# Patient Record
Sex: Female | Born: 1935 | Race: White | Hispanic: No | Marital: Single | Smoking: Never smoker
Health system: Southern US, Community
[De-identification: ages and names within clinical notes are randomized; demographics above are authoritative.]

## PROBLEM LIST (undated history)

## (undated) DIAGNOSIS — D6859 Other primary thrombophilia: Secondary | ICD-10-CM

## (undated) DIAGNOSIS — I2699 Other pulmonary embolism without acute cor pulmonale: Secondary | ICD-10-CM

## (undated) DIAGNOSIS — J45909 Unspecified asthma, uncomplicated: Secondary | ICD-10-CM

## (undated) DIAGNOSIS — I10 Essential (primary) hypertension: Secondary | ICD-10-CM

---

## 2007-01-29 ENCOUNTER — Ambulatory Visit: Payer: Self-pay | Admitting: Cardiology

## 2011-08-19 ENCOUNTER — Ambulatory Visit (HOSPITAL_COMMUNITY)
Admission: RE | Admit: 2011-08-19 | Discharge: 2011-08-19 | Disposition: A | Discharge status: Home / Self Care | Payer: Medicare Other | Source: Ambulatory Visit | Attending: Ophthalmology | Admitting: Ophthalmology

## 2011-08-19 ENCOUNTER — Encounter (HOSPITAL_COMMUNITY)
Admission: RE | Disposition: A | Discharge status: Home / Self Care | Payer: Self-pay | Source: Ambulatory Visit | Attending: Ophthalmology

## 2011-08-19 DIAGNOSIS — H26499 Other secondary cataract, unspecified eye: Secondary | ICD-10-CM | POA: Insufficient documentation

## 2011-08-19 SURGERY — TREATMENT, USING YAG LASER
Anesthesia: LOCAL | Laterality: Left

## 2011-08-19 MED ORDER — TETRACAINE HCL 0.5 % OP SOLN
1.0000 [drp] | Freq: Once | OPHTHALMIC | Status: AC
Start: 2011-08-19 — End: 2011-08-19
  Administered 2011-08-19: 1 [drp] via OPHTHALMIC

## 2011-08-19 MED ORDER — TROPICAMIDE 1 % OP SOLN
OPHTHALMIC | Status: AC
  Administered 2011-08-19: 1 [drp] via OPHTHALMIC
  Filled 2011-08-19: qty 3

## 2011-08-19 MED ORDER — TROPICAMIDE 1 % OP SOLN
1.0000 [drp] | OPHTHALMIC | Status: AC
  Administered 2011-08-19 (×3): 1 [drp] via OPHTHALMIC

## 2011-08-19 MED ORDER — APRACLONIDINE HCL 1 % OP SOLN
OPHTHALMIC | Status: AC
  Administered 2011-08-19: 1 [drp] via OPHTHALMIC
  Filled 2011-08-19: qty 0.1

## 2011-08-19 MED ORDER — APRACLONIDINE HCL 1 % OP SOLN
1.0000 [drp] | OPHTHALMIC | Status: AC
  Administered 2011-08-19 (×3): 1 [drp] via OPHTHALMIC

## 2011-08-19 MED ORDER — TETRACAINE HCL 0.5 % OP SOLN
OPHTHALMIC | Status: AC
  Administered 2011-08-19: 1 [drp] via OPHTHALMIC
  Filled 2011-08-19: qty 2

## 2011-08-19 NOTE — Brief Op Note (Signed)
Zoeann Mol 08/19/2011  Susa Simmonds  Yag Laser Self Test Completedyes. Procedure: Posterior Capsulotomy, left eye.  Eye Protection Worn by Staff yes. Laser In Use Sign on Door yes.  Laser: Nd:YAG Spot Size: Fixed Burst Mode: III Power Setting: 1.7 mJ/burst Position treated: central post capsule Number of shots: 35 Total energy delivered: 58 mJ  Patency of the peripheral iridotomy was confirmed visually.  The patient tolerated the procedure without difficulty. No complications were encountered.  Tenometer reading immediately after procedure: 15 mmHg.  The patient was discharged home with the instructions to continue all her current glaucoma medications, if any.   Patient instructed to go to office at 1000 for intraocular pressure check.  Patient verbalizes understanding of discharge instructions yes.   Notes:none

## 2011-08-19 NOTE — H&P (Signed)
See office H&P

## 2014-06-12 ENCOUNTER — Emergency Department (HOSPITAL_COMMUNITY): Payer: Medicare Other

## 2014-06-12 ENCOUNTER — Inpatient Hospital Stay (HOSPITAL_COMMUNITY)
Admission: EM | Admit: 2014-06-12 | Discharge: 2014-06-16 | DRG: 481 | Disposition: A | Discharge status: Rehabilitation Facility | Payer: Medicare Other | Attending: Internal Medicine | Admitting: Internal Medicine

## 2014-06-12 ENCOUNTER — Encounter (HOSPITAL_COMMUNITY)
Admission: EM | Disposition: A | Discharge status: Rehabilitation Facility | Payer: Self-pay | Source: Home / Self Care | Attending: Internal Medicine

## 2014-06-12 ENCOUNTER — Encounter (HOSPITAL_COMMUNITY): Payer: Self-pay | Admitting: Emergency Medicine

## 2014-06-12 DIAGNOSIS — D62 Acute posthemorrhagic anemia: Secondary | ICD-10-CM | POA: Diagnosis not present

## 2014-06-12 DIAGNOSIS — E860 Dehydration: Secondary | ICD-10-CM | POA: Diagnosis not present

## 2014-06-12 DIAGNOSIS — T4145XA Adverse effect of unspecified anesthetic, initial encounter: Secondary | ICD-10-CM | POA: Diagnosis not present

## 2014-06-12 DIAGNOSIS — Z7901 Long term (current) use of anticoagulants: Secondary | ICD-10-CM

## 2014-06-12 DIAGNOSIS — S7291XA Unspecified fracture of right femur, initial encounter for closed fracture: Secondary | ICD-10-CM | POA: Diagnosis present

## 2014-06-12 DIAGNOSIS — T502X5A Adverse effect of carbonic-anhydrase inhibitors, benzothiadiazides and other diuretics, initial encounter: Secondary | ICD-10-CM | POA: Diagnosis present

## 2014-06-12 DIAGNOSIS — S7221XA Displaced subtrochanteric fracture of right femur, initial encounter for closed fracture: Secondary | ICD-10-CM | POA: Diagnosis present

## 2014-06-12 DIAGNOSIS — W1839XA Other fall on same level, initial encounter: Secondary | ICD-10-CM | POA: Diagnosis present

## 2014-06-12 DIAGNOSIS — Z880 Allergy status to penicillin: Secondary | ICD-10-CM

## 2014-06-12 DIAGNOSIS — Z86711 Personal history of pulmonary embolism: Secondary | ICD-10-CM | POA: Diagnosis not present

## 2014-06-12 DIAGNOSIS — E669 Obesity, unspecified: Secondary | ICD-10-CM | POA: Diagnosis present

## 2014-06-12 DIAGNOSIS — D6859 Other primary thrombophilia: Secondary | ICD-10-CM | POA: Diagnosis present

## 2014-06-12 DIAGNOSIS — I952 Hypotension due to drugs: Secondary | ICD-10-CM | POA: Diagnosis not present

## 2014-06-12 DIAGNOSIS — S72141A Displaced intertrochanteric fracture of right femur, initial encounter for closed fracture: Secondary | ICD-10-CM

## 2014-06-12 DIAGNOSIS — M25551 Pain in right hip: Secondary | ICD-10-CM | POA: Diagnosis present

## 2014-06-12 DIAGNOSIS — E871 Hypo-osmolality and hyponatremia: Secondary | ICD-10-CM | POA: Diagnosis not present

## 2014-06-12 DIAGNOSIS — K219 Gastro-esophageal reflux disease without esophagitis: Secondary | ICD-10-CM | POA: Diagnosis present

## 2014-06-12 DIAGNOSIS — N179 Acute kidney failure, unspecified: Secondary | ICD-10-CM

## 2014-06-12 DIAGNOSIS — S72001A Fracture of unspecified part of neck of right femur, initial encounter for closed fracture: Secondary | ICD-10-CM

## 2014-06-12 DIAGNOSIS — Z6837 Body mass index (BMI) 37.0-37.9, adult: Secondary | ICD-10-CM

## 2014-06-12 DIAGNOSIS — E861 Hypovolemia: Secondary | ICD-10-CM | POA: Diagnosis not present

## 2014-06-12 DIAGNOSIS — Z9109 Other allergy status, other than to drugs and biological substances: Secondary | ICD-10-CM

## 2014-06-12 DIAGNOSIS — W19XXXA Unspecified fall, initial encounter: Secondary | ICD-10-CM

## 2014-06-12 DIAGNOSIS — I10 Essential (primary) hypertension: Secondary | ICD-10-CM | POA: Diagnosis present

## 2014-06-12 HISTORY — PX: FEMUR IM NAIL: SHX1597

## 2014-06-12 HISTORY — DX: Other pulmonary embolism without acute cor pulmonale: I26.99

## 2014-06-12 HISTORY — DX: Essential (primary) hypertension: I10

## 2014-06-12 HISTORY — DX: Other primary thrombophilia: D68.59

## 2014-06-12 HISTORY — DX: Unspecified asthma, uncomplicated: J45.909

## 2014-06-12 LAB — COMPREHENSIVE METABOLIC PANEL
ALBUMIN: 3.5 g/dL (ref 3.5–5.2)
ALK PHOS: 63 U/L (ref 39–117)
ALT: 12 U/L (ref 0–35)
AST: 26 U/L (ref 0–37)
Anion gap: 10 (ref 5–15)
BILIRUBIN TOTAL: 0.4 mg/dL (ref 0.3–1.2)
BUN: 16 mg/dL (ref 6–23)
CHLORIDE: 94 meq/L — AB (ref 96–112)
CO2: 28 mEq/L (ref 19–32)
Calcium: 9.1 mg/dL (ref 8.4–10.5)
Creatinine, Ser: 0.74 mg/dL (ref 0.50–1.10)
GFR calc Af Amer: 89 mL/min — ABNORMAL LOW (ref >90)
GFR calc non Af Amer: 77 mL/min — ABNORMAL LOW (ref >90)
Glucose, Bld: 166 mg/dL — ABNORMAL HIGH (ref 70–99)
POTASSIUM: 4.3 meq/L (ref 3.7–5.3)
Sodium: 132 mEq/L — ABNORMAL LOW (ref 137–147)
TOTAL PROTEIN: 7.2 g/dL (ref 6.0–8.3)

## 2014-06-12 LAB — CBC WITH DIFFERENTIAL/PLATELET
BASOS ABS: 0 10*3/uL (ref 0.0–0.1)
BASOS PCT: 0 % (ref 0–1)
Eosinophils Absolute: 0.1 10*3/uL (ref 0.0–0.7)
Eosinophils Relative: 2 % (ref 0–5)
HCT: 39.1 % (ref 36.0–46.0)
Hemoglobin: 13.3 g/dL (ref 12.0–15.0)
Lymphocytes Relative: 31 % (ref 12–46)
Lymphs Abs: 1.6 10*3/uL (ref 0.7–4.0)
MCH: 30.1 pg (ref 26.0–34.0)
MCHC: 34 g/dL (ref 30.0–36.0)
MCV: 88.5 fL (ref 78.0–100.0)
Monocytes Absolute: 0.4 10*3/uL (ref 0.1–1.0)
Monocytes Relative: 7 % (ref 3–12)
NEUTROS ABS: 3.1 10*3/uL (ref 1.7–7.7)
NEUTROS PCT: 60 % (ref 43–77)
PLATELETS: 206 10*3/uL (ref 150–400)
RBC: 4.42 MIL/uL (ref 3.87–5.11)
RDW: 13.7 % (ref 11.5–15.5)
WBC: 5.2 10*3/uL (ref 4.0–10.5)

## 2014-06-12 LAB — URINE MICROSCOPIC-ADD ON

## 2014-06-12 LAB — URINALYSIS, ROUTINE W REFLEX MICROSCOPIC
Bilirubin Urine: NEGATIVE
GLUCOSE, UA: NEGATIVE mg/dL
HGB URINE DIPSTICK: NEGATIVE
Ketones, ur: NEGATIVE mg/dL
Nitrite: NEGATIVE
Protein, ur: NEGATIVE mg/dL
SPECIFIC GRAVITY, URINE: 1.016 (ref 1.005–1.030)
UROBILINOGEN UA: 0.2 mg/dL (ref 0.0–1.0)
pH: 7 (ref 5.0–8.0)

## 2014-06-12 LAB — PROTIME-INR
INR: 1.67 — ABNORMAL HIGH (ref 0.00–1.49)
PROTHROMBIN TIME: 19.9 s — AB (ref 11.6–15.2)

## 2014-06-12 SURGERY — INSERTION, INTRAMEDULLARY ROD, FEMUR
Anesthesia: General | Site: Leg Upper | Laterality: Right

## 2014-06-12 MED ORDER — FENTANYL CITRATE 0.05 MG/ML IJ SOLN
25.0000 ug | INTRAMUSCULAR | Status: DC | PRN
  Administered 2014-06-13 (×2): 25 ug via INTRAVENOUS
  Administered 2014-06-13: 50 ug via INTRAVENOUS

## 2014-06-12 MED ORDER — AMLODIPINE BESYLATE 5 MG PO TABS
5.0000 mg | ORAL_TABLET | Freq: Every day | ORAL | Status: DC
  Filled 2014-06-12: qty 1

## 2014-06-12 MED ORDER — MORPHINE SULFATE 2 MG/ML IJ SOLN
2.0000 mg | INTRAMUSCULAR | Status: DC | PRN
  Administered 2014-06-12: 2 mg via INTRAVENOUS
  Filled 2014-06-12: qty 1

## 2014-06-12 MED ORDER — LORATADINE 10 MG PO TABS
10.0000 mg | ORAL_TABLET | Freq: Every day | ORAL | Status: DC
  Administered 2014-06-13 – 2014-06-16 (×4): 10 mg via ORAL
  Filled 2014-06-12 (×4): qty 1

## 2014-06-12 MED ORDER — AMLODIPINE BESYLATE 2.5 MG PO TABS
2.5000 mg | ORAL_TABLET | Freq: Every day | ORAL | Status: DC
  Filled 2014-06-12 (×2): qty 1

## 2014-06-12 MED ORDER — HYDROCODONE-ACETAMINOPHEN 5-325 MG PO TABS: 1.0000 | ORAL_TABLET | Freq: Four times a day (QID) | ORAL | Status: DC | PRN

## 2014-06-12 MED ORDER — IRBESARTAN 300 MG PO TABS
300.0000 mg | ORAL_TABLET | Freq: Every day | ORAL | Status: DC
  Filled 2014-06-12: qty 1

## 2014-06-12 MED ORDER — HYDROCHLOROTHIAZIDE 25 MG PO TABS
25.0000 mg | ORAL_TABLET | Freq: Every day | ORAL | Status: DC
  Filled 2014-06-12: qty 1

## 2014-06-12 MED ORDER — DOCUSATE SODIUM 100 MG PO CAPS: 400.0000 mg | ORAL_CAPSULE | Freq: Every day | ORAL | Status: DC

## 2014-06-12 MED ORDER — OXYBUTYNIN CHLORIDE ER 5 MG PO TB24
5.0000 mg | ORAL_TABLET | Freq: Every day | ORAL | Status: DC
  Administered 2014-06-13 – 2014-06-16 (×4): 5 mg via ORAL
  Filled 2014-06-12 (×4): qty 1

## 2014-06-12 MED ORDER — FENTANYL CITRATE 0.05 MG/ML IJ SOLN
50.0000 ug | Freq: Once | INTRAMUSCULAR | Status: AC
Start: 2014-06-12 — End: 2014-06-12
  Administered 2014-06-12: 50 ug via INTRAVENOUS
  Filled 2014-06-12: qty 2

## 2014-06-12 MED ORDER — CALCIUM CARBONATE-VITAMIN D 500-200 MG-UNIT PO TABS
1.0000 | ORAL_TABLET | Freq: Two times a day (BID) | ORAL | Status: DC
  Administered 2014-06-13 – 2014-06-16 (×7): 1 via ORAL
  Filled 2014-06-12 (×9): qty 1

## 2014-06-12 MED ORDER — FENTANYL CITRATE 0.05 MG/ML IJ SOLN
50.0000 ug | Freq: Once | INTRAMUSCULAR | Status: AC
  Administered 2014-06-12: 50 ug via INTRAVENOUS
  Filled 2014-06-12: qty 2

## 2014-06-12 MED ORDER — FENTANYL CITRATE 0.05 MG/ML IJ SOLN
INTRAMUSCULAR | Status: AC
  Filled 2014-06-12: qty 5

## 2014-06-12 MED ORDER — PROPOFOL 10 MG/ML IV BOLUS
INTRAVENOUS | Status: AC
  Filled 2014-06-12: qty 20

## 2014-06-12 MED ORDER — AMLODIPINE BESYLATE 5 MG PO TABS
2.5000 mg | ORAL_TABLET | Freq: Two times a day (BID) | ORAL | Status: DC
  Filled 2014-06-12: qty 1

## 2014-06-12 MED ORDER — VALSARTAN-HYDROCHLOROTHIAZIDE 320-25 MG PO TABS: 1.0000 | ORAL_TABLET | Freq: Every day | ORAL | Status: DC

## 2014-06-12 MED ORDER — OMEPRAZOLE MAGNESIUM 20 MG PO TBEC: 20.0000 mg | DELAYED_RELEASE_TABLET | Freq: Every day | ORAL | Status: DC

## 2014-06-12 MED ORDER — PANTOPRAZOLE SODIUM 40 MG PO TBEC
40.0000 mg | DELAYED_RELEASE_TABLET | Freq: Every day | ORAL | Status: DC
  Administered 2014-06-13 – 2014-06-16 (×4): 40 mg via ORAL
  Filled 2014-06-12 (×4): qty 1

## 2014-06-12 MED ORDER — ASPIRIN EC 325 MG PO TBEC
325.0000 mg | DELAYED_RELEASE_TABLET | Freq: Every day | ORAL | Status: DC
  Filled 2014-06-12 (×2): qty 1

## 2014-06-12 MED ORDER — CALTRATE 600+D PLUS MINERALS 600-800 MG-UNIT PO TABS: 1.0000 | ORAL_TABLET | Freq: Two times a day (BID) | ORAL | Status: DC

## 2014-06-12 SURGICAL SUPPLY — 53 items
BIT DRILL 4.3MMS DISTAL GRDTED (BIT) IMPLANT
BNDG COHESIVE 6X5 TAN STRL LF (GAUZE/BANDAGES/DRESSINGS) ×2 IMPLANT
BNDG GAUZE ELAST 4 BULKY (GAUZE/BANDAGES/DRESSINGS) ×2 IMPLANT
CANISTER SUCT 3000ML (MISCELLANEOUS) ×2 IMPLANT
CHLORAPREP W/TINT 26ML (MISCELLANEOUS) ×2 IMPLANT
COVER MAYO STAND STRL (DRAPES) IMPLANT
COVER PERINEAL POST (MISCELLANEOUS) ×2 IMPLANT
COVER SURGICAL LIGHT HANDLE (MISCELLANEOUS) ×4 IMPLANT
DRAPE INCISE IOBAN 66X45 STRL (DRAPES) IMPLANT
DRAPE PROXIMA HALF (DRAPES) IMPLANT
DRAPE STERI IOBAN 125X83 (DRAPES) ×2 IMPLANT
DRAPE U-SHAPE 47X51 STRL (DRAPES) ×1 IMPLANT
DRILL 4.3MMS DISTAL GRADUATED (BIT) ×2
DRSG ADAPTIC 3X8 NADH LF (GAUZE/BANDAGES/DRESSINGS) ×1 IMPLANT
DRSG MEPILEX BORDER 4X4 (GAUZE/BANDAGES/DRESSINGS) ×3 IMPLANT
DRSG MEPILEX BORDER 4X8 (GAUZE/BANDAGES/DRESSINGS) ×2 IMPLANT
ELECT REM PT RETURN 9FT ADLT (ELECTROSURGICAL) ×2
ELECTRODE REM PT RTRN 9FT ADLT (ELECTROSURGICAL) ×1 IMPLANT
EVACUATOR 1/8 PVC DRAIN (DRAIN) IMPLANT
FACESHIELD WRAPAROUND (MASK) ×4 IMPLANT
FACESHIELD WRAPAROUND OR TEAM (MASK) ×2 IMPLANT
GLOVE BIO SURGEON STRL SZ7 (GLOVE) ×4 IMPLANT
GLOVE BIO SURGEON STRL SZ8 (GLOVE) ×2 IMPLANT
GLOVE BIOGEL PI IND STRL 7.5 (GLOVE) ×1 IMPLANT
GLOVE BIOGEL PI IND STRL 8 (GLOVE) ×1 IMPLANT
GLOVE BIOGEL PI INDICATOR 7.5 (GLOVE) ×1
GLOVE BIOGEL PI INDICATOR 8 (GLOVE) ×1
GOWN STRL REUS W/ TWL LRG LVL3 (GOWN DISPOSABLE) ×1 IMPLANT
GOWN STRL REUS W/ TWL XL LVL3 (GOWN DISPOSABLE) IMPLANT
GOWN STRL REUS W/TWL LRG LVL3 (GOWN DISPOSABLE) ×2
GOWN STRL REUS W/TWL XL LVL3 (GOWN DISPOSABLE) ×2
GUIDEWIRE BALL NOSE 100CM (WIRE) ×1 IMPLANT
KIT ROOM TURNOVER OR (KITS) ×2 IMPLANT
LINER BOOT UNIVERSAL DISP (MISCELLANEOUS) ×2 IMPLANT
NAIL HIP FRACTURE 11X380MM (Nail) ×1 IMPLANT
NS IRRIG 1000ML POUR BTL (IV SOLUTION) ×2 IMPLANT
PACK GENERAL/GYN (CUSTOM PROCEDURE TRAY) ×2 IMPLANT
PAD ARMBOARD 7.5X6 YLW CONV (MISCELLANEOUS) ×4 IMPLANT
PADDING CAST SYNTHETIC 4 (CAST SUPPLIES) ×1
PADDING CAST SYNTHETIC 4X4 STR (CAST SUPPLIES) ×1 IMPLANT
SCREW BONE CORTICAL 5.0X44 (Screw) ×1 IMPLANT
SCREW LAG HIP NAIL 10.5X95 (Screw) ×1 IMPLANT
STAPLER VISISTAT 35W (STAPLE) ×2 IMPLANT
SUT MNCRL AB 3-0 PS2 18 (SUTURE) ×2 IMPLANT
SUT MNCRL AB 4-0 PS2 18 (SUTURE) ×2 IMPLANT
SUT PROLENE 3 0 PS 1 (SUTURE) ×2 IMPLANT
SUT VIC AB 0 CT1 27 (SUTURE) ×4
SUT VIC AB 0 CT1 27XBRD ANBCTR (SUTURE) ×2 IMPLANT
SUT VIC AB 2-0 CT1 27 (SUTURE) ×4
SUT VIC AB 2-0 CT1 TAPERPNT 27 (SUTURE) ×2 IMPLANT
TOWEL OR 17X24 6PK STRL BLUE (TOWEL DISPOSABLE) ×2 IMPLANT
TOWEL OR 17X26 10 PK STRL BLUE (TOWEL DISPOSABLE) ×2 IMPLANT
WATER STERILE IRR 1000ML POUR (IV SOLUTION) ×2 IMPLANT

## 2014-06-12 NOTE — ED Notes (Signed)
Pt placed on 4L, not maintaining O2 sats of >88 on 3L.

## 2014-06-12 NOTE — ED Provider Notes (Signed)
CSN: [id number]     Arrival date & time 06/12/14  1711 History   First MD Initiated Contact with Patient 06/12/14 1734     Chief Complaint  Patient presents with  . Hip Injury    right     (Consider location/radiation/quality/duration/timing/severity/associated sxs/prior Treatment) Patient is a 80 y.o. female presenting with fall. The history is provided by the patient.  Fall This is a new problem. The current episode started 1 to 2 hours ago. Episode frequency: once. The problem has been resolved. Pertinent negatives include no chest pain, no abdominal pain, no headaches and no shortness of breath. Nothing aggravates the symptoms. Nothing relieves the symptoms. She has tried nothing for the symptoms. The treatment provided no relief.    Past Medical History  Diagnosis Date  . Hypertension   . Asthma    History reviewed. No pertinent past surgical history. History reviewed. No pertinent family history. History  Substance Use Topics  . Smoking status: Never Smoker   . Smokeless tobacco: Not on file  . Alcohol Use: No   OB History    No data available     Review of Systems  Constitutional: Negative for fever and fatigue.  HENT: Negative for congestion and drooling.   Eyes: Negative for pain.  Respiratory: Negative for cough and shortness of breath.   Cardiovascular: Negative for chest pain.  Gastrointestinal: Negative for nausea, vomiting, abdominal pain and diarrhea.  Genitourinary: Negative for dysuria and hematuria.  Musculoskeletal: Negative for back pain, gait problem and neck pain.  Skin: Negative for color change.  Neurological: Negative for dizziness and headaches.  Hematological: Negative for adenopathy.  Psychiatric/Behavioral: Negative for behavioral problems.  All other systems reviewed and are negative.     Allergies  Penicillins and Tuberculin tests  Home Medications   Prior to Admission medications   Not on File   BP 160/60 mmHg  Pulse 81   Temp(Src) 97.7 F (36.5 C) (Oral)  Resp 18  Ht 5\' 1"  (1.549 m)  Wt 198 lb (89.812 kg)  BMI 37.43 kg/m2  SpO2 93% Physical Exam  Constitutional: She appears well-developed and well-nourished.  HENT:  Head: Normocephalic and atraumatic.  Mouth/Throat: Oropharynx is clear and moist. No oropharyngeal exudate.  Eyes: Conjunctivae and EOM are normal. Pupils are equal, round, and reactive to light.  Neck: Normal range of motion. Neck supple.  Cardiovascular: Normal rate, regular rhythm, normal heart sounds and intact distal pulses.  Exam reveals no gallop and no friction rub.   No murmur heard. Pulmonary/Chest: Effort normal and breath sounds normal. No respiratory distress. She has no wheezes.  Abdominal: Soft. Bowel sounds are normal. There is no tenderness. There is no rebound and no guarding.  Musculoskeletal: Normal range of motion. She exhibits no edema or tenderness.  2+ distal pulses in bilateral lower extremities.  Mild to moderate tenderness to palpation of the right lateral hip.  Sensation intact in bilateral lower extremities.  Neurological: She is alert.  Skin: Skin is warm and dry.  Psychiatric: She has a normal mood and affect. Her behavior is normal.  Nursing note and vitals reviewed.   ED Course  Procedures (including critical care time) Labs Review Labs Reviewed  COMPREHENSIVE METABOLIC PANEL - Abnormal; Notable for the following:    Sodium 132 (*)    Chloride 94 (*)    Glucose, Bld 166 (*)    GFR calc non Af Amer 77 (*)    GFR calc Af Amer 89 (*)  All other components within normal limits  PROTIME-INR - Abnormal; Notable for the following:    Prothrombin Time 19.9 (*)    INR 1.67 (*)    All other components within normal limits  URINALYSIS, ROUTINE W REFLEX MICROSCOPIC - Abnormal; Notable for the following:    Leukocytes, UA SMALL (*)    All other components within normal limits  CBC - Abnormal; Notable for the following:    RBC 3.80 (*)    Hemoglobin  11.3 (*)    HCT 33.9 (*)    All other components within normal limits  BASIC METABOLIC PANEL - Abnormal; Notable for the following:    Sodium 126 (*)    Potassium 3.6 (*)    Chloride 92 (*)    Glucose, Bld 178 (*)    Calcium 8.3 (*)    GFR calc non Af Amer 82 (*)    All other components within normal limits  PROTIME-INR - Abnormal; Notable for the following:    Prothrombin Time 21.4 (*)    INR 1.83 (*)    All other components within normal limits  CBC - Abnormal; Notable for the following:    RBC 3.28 (*)    Hemoglobin 9.9 (*)    HCT 29.7 (*)    All other components within normal limits  BASIC METABOLIC PANEL - Abnormal; Notable for the following:    Sodium 132 (*)    Glucose, Bld 144 (*)    Calcium 7.9 (*)    GFR calc non Af Amer 60 (*)    GFR calc Af Amer 69 (*)    All other components within normal limits  CBC WITH DIFFERENTIAL  URINE MICROSCOPIC-ADD ON    Imaging Review Dg Chest 1 View  06/12/2014   CLINICAL DATA:  Fall. Initial encounter. RIGHT hip pain radiating to the knee. Preoperative chest radiograph.  EXAM: CHEST - 1 VIEW  COMPARISON:  06/12/2014.  FINDINGS: Cardiomegaly. Tortuous thoracic aorta. LEFT shoulder hemiarthroplasty. RIGHT shoulder osteoarthritis incidentally noted. There is no airspace disease, pneumothorax or displaced rib fractures identified non this frontal view of the chest. Subsegmental atelectasis and/ or scarring is present in the mid chest bilaterally.  IMPRESSION: 1. Cardiomegaly without failure. 2. Scarring or subsegmental atelectasis in the lungs bilaterally.   Electronically Signed   By: Dereck Ligas M.D.   On: 06/12/2014 19:05   Dg Hip Complete Right  06/12/2014   CLINICAL DATA:  Fall and complains of right hip pain. Pain goes down to the right knee.  EXAM: RIGHT HIP - COMPLETE 2+ VIEW  COMPARISON:  None.  FINDINGS: There is a displaced fracture of the proximal right femur. The distal fragment is medially displaced. There is at least a 1/2  shaft width of displacement. This is an oblique fracture which appears to involve the lesser trochanter and subtrochanteric region. Pelvic bony ring is intact. No gross abnormality to the left hip. Right hip is located.  IMPRESSION: Displaced fracture of the proximal right femur.   Electronically Signed   By: Markus Daft M.D.   On: 06/12/2014 19:06   Dg Femur Right  06/13/2014   CLINICAL DATA:  Femur fracture, pain, initial encounter.  EXAM: RIGHT FEMUR - 2 VIEW; DG C-ARM 61-120 MIN  COMPARISON:  06/12/2014 radiographs  FINDINGS: C-arm films document placement of an intramedullary rod with compression screw across a proximal femur fracture. Improved position and alignment.  IMPRESSION: As above.   Electronically Signed   By: Michae Kava.D.  On: 06/13/2014 07:11   Ct Head Wo Contrast  06/12/2014   CLINICAL DATA:  Fall. Anticoagulated patient. Head trauma. Initial encounter.  EXAM: CT HEAD WITHOUT CONTRAST  TECHNIQUE: Contiguous axial images were obtained from the base of the skull through the vertex without intravenous contrast.  COMPARISON:  None.  FINDINGS: No mass lesion, mass effect, midline shift, hydrocephalus, hemorrhage. No acute territorial cortical ischemia/infarct. Atrophy and chronic ischemic white matter disease is present. Intracranial atherosclerosis. Paranasal sinuses are within normal limits. Bilateral lens extractions.  IMPRESSION: Atrophy and chronic ischemic white matter disease without acute intracranial abnormality.   Electronically Signed   By: Dereck Ligas M.D.   On: 06/12/2014 18:20   Dg C-arm 1-60 Min  06/13/2014   CLINICAL DATA:  Femur fracture, pain, initial encounter.  EXAM: RIGHT FEMUR - 2 VIEW; DG C-ARM 61-120 MIN  COMPARISON:  06/12/2014 radiographs  FINDINGS: C-arm films document placement of an intramedullary rod with compression screw across a proximal femur fracture. Improved position and alignment.  IMPRESSION: As above.   Electronically Signed   By: Rolla Flatten  M.D.   On: 06/13/2014 07:11     EKG Interpretation   Date/Time:  Sunday June 12 2014 17:54:50 EST Ventricular Rate:  87 PR Interval:  184 QRS Duration: 89 QT Interval:  381 QTC Calculation: 458 R Axis:   12 Text Interpretation:  Sinus rhythm Probable lateral infarct, age  indeterminate Confirmed by Annalyce Lanpher  MD, Deondrea Aguado ([id number]) on 06/13/2014  4:58:14 PM      MDM   Final diagnoses:  Fracture, proximal femur, right, closed, initial encounter    5:55 PM 80 y.o. female with history of hypertension, on coumadin who presents with a fall which occurred prior to arrival. The patient states that she was walking in her kitchen when her right leg gave out. She fell to the ground landing on her right hip and hitting her head against a wall. She denies any loss of consciousness. She denies any headache currently.her only current complaint is right hip pain. We'll get screening imaging, labs, and pain control.  8:46 PM I spoke w/ Dr. Doran Durand. He plans to take the pt to the OR tonight.   Pamella Pert, MD 06/13/14 [phone]

## 2014-06-12 NOTE — ED Notes (Signed)
Patient transported to CT 

## 2014-06-12 NOTE — Consult Note (Signed)
Reason for Consult:  Right hip fracture Referring Physician:  Dr. [name] is an 80 y.o. female.  HPI:  80 y/o female with PMH of hypercoagulable state and PE fell earlier this evening in her kitchen landing on her right side.  She c/o aching pain in the right hip that is moderate when lying still and sharp and severe when trying to move the right lower extremity.  No h/o previous hip fracture.  No h/o smoking.  Pt has been "borderline" diabetic in the past but takes no insulin.  She takes coumadin, and INR is 1.6 on admission.  Nothing to eat or drink since lunch at about noon today.  She denies and numbness, tingling or weakness in the right LE.  Past Medical History  Diagnosis Date  . Hypertension   . Asthma   . PE (pulmonary embolism)   . Protein C deficiency   . Protein S deficiency     PSH:  Knee surgery by Dr. Luna Glasgow.  History reviewed. No pertinent family history.  Social History:  reports that she has never smoked. She does not have any smokeless tobacco history on file. She reports that she does not drink alcohol or use illicit drugs.  Allergies:  Allergies  Allergen Reactions  . Adhesive [Tape] Itching  . Penicillins Itching, Swelling and Rash    Streptomycin also  . Tuberculin Tests Itching, Swelling and Rash    Medications: I have reviewed the patient's current medications.  Results for orders placed or performed during the hospital encounter of 06/12/14 (from the past 48 hour(s))  CBC with Differential     Status: None   Collection Time: 06/12/14  5:34 PM  Result Value Ref Range   WBC 5.2 4.0 - 10.5 K/uL   RBC 4.42 3.87 - 5.11 MIL/uL   Hemoglobin 13.3 12.0 - 15.0 g/dL   HCT 39.1 36.0 - 46.0 %   MCV 88.5 78.0 - 100.0 fL   MCH 30.1 26.0 - 34.0 pg   MCHC 34.0 30.0 - 36.0 g/dL   RDW 13.7 11.5 - 15.5 %   Platelets 206 150 - 400 K/uL   Neutrophils Relative % 60 43 - 77 %   Neutro Abs 3.1 1.7 - 7.7 K/uL   Lymphocytes Relative 31 12 - 46 %   Lymphs Abs 1.6 0.7 - 4.0 K/uL   Monocytes Relative 7 3 - 12 %   Monocytes Absolute 0.4 0.1 - 1.0 K/uL   Eosinophils Relative 2 0 - 5 %   Eosinophils Absolute 0.1 0.0 - 0.7 K/uL   Basophils Relative 0 0 - 1 %   Basophils Absolute 0.0 0.0 - 0.1 K/uL  Comprehensive metabolic panel     Status: Abnormal   Collection Time: 06/12/14  5:34 PM  Result Value Ref Range   Sodium 132 (L) 137 - 147 mEq/L   Potassium 4.3 3.7 - 5.3 mEq/L   Chloride 94 (L) 96 - 112 mEq/L   CO2 28 19 - 32 mEq/L   Glucose, Bld 166 (H) 70 - 99 mg/dL   BUN 16 6 - 23 mg/dL   Creatinine, Ser 0.74 0.50 - 1.10 mg/dL   Calcium 9.1 8.4 - 10.5 mg/dL   Total Protein 7.2 6.0 - 8.3 g/dL   Albumin 3.5 3.5 - 5.2 g/dL   AST 26 0 - 37 U/L   ALT 12 0 - 35 U/L   Alkaline Phosphatase 63 39 - 117 U/L   Total Bilirubin 0.4 0.3 - 1.2  mg/dL   GFR calc non Af Amer 77 (L) >90 mL/min   GFR calc Af Amer 89 (L) >90 mL/min    Comment: (NOTE) The eGFR has been calculated using the CKD EPI equation. This calculation has not been validated in all clinical situations. eGFR's persistently <90 mL/min signify possible Chronic Kidney Disease.    Anion gap 10 5 - 15  Protime-INR     Status: Abnormal   Collection Time: 06/12/14  5:34 PM  Result Value Ref Range   Prothrombin Time 19.9 (H) 11.6 - 15.2 seconds   INR 1.67 (H) 0.00 - 1.49  Urinalysis, Routine w reflex microscopic     Status: Abnormal   Collection Time: 06/12/14  9:06 PM  Result Value Ref Range   Color, Urine YELLOW YELLOW   APPearance CLEAR CLEAR   Specific Gravity, Urine 1.016 1.005 - 1.030   pH 7.0 5.0 - 8.0   Glucose, UA NEGATIVE NEGATIVE mg/dL   Hgb urine dipstick NEGATIVE NEGATIVE   Bilirubin Urine NEGATIVE NEGATIVE   Ketones, ur NEGATIVE NEGATIVE mg/dL   Protein, ur NEGATIVE NEGATIVE mg/dL   Urobilinogen, UA 0.2 0.0 - 1.0 mg/dL   Nitrite NEGATIVE NEGATIVE   Leukocytes, UA SMALL (A) NEGATIVE  Urine microscopic-add on     Status: None   Collection Time: 06/12/14  9:06 PM   Result Value Ref Range   Squamous Epithelial / LPF RARE RARE   WBC, UA 7-10 <3 WBC/hpf   Bacteria, UA RARE RARE    Dg Chest 1 View  06/12/2014   CLINICAL DATA:  Fall. Initial encounter. RIGHT hip pain radiating to the knee. Preoperative chest radiograph.  EXAM: CHEST - 1 VIEW  COMPARISON:  06/12/2014.  FINDINGS: Cardiomegaly. Tortuous thoracic aorta. LEFT shoulder hemiarthroplasty. RIGHT shoulder osteoarthritis incidentally noted. There is no airspace disease, pneumothorax or displaced rib fractures identified non this frontal view of the chest. Subsegmental atelectasis and/ or scarring is present in the mid chest bilaterally.  IMPRESSION: 1. Cardiomegaly without failure. 2. Scarring or subsegmental atelectasis in the lungs bilaterally.   Electronically Signed   By: Dereck Ligas M.D.   On: 06/12/2014 19:05   Dg Hip Complete Right  06/12/2014   CLINICAL DATA:  Fall and complains of right hip pain. Pain goes down to the right knee.  EXAM: RIGHT HIP - COMPLETE 2+ VIEW  COMPARISON:  None.  FINDINGS: There is a displaced fracture of the proximal right femur. The distal fragment is medially displaced. There is at least a 1/2 shaft width of displacement. This is an oblique fracture which appears to involve the lesser trochanter and subtrochanteric region. Pelvic bony ring is intact. No gross abnormality to the left hip. Right hip is located.  IMPRESSION: Displaced fracture of the proximal right femur.   Electronically Signed   By: Markus Daft M.D.   On: 06/12/2014 19:06   Ct Head Wo Contrast  06/12/2014   CLINICAL DATA:  Fall. Anticoagulated patient. Head trauma. Initial encounter.  EXAM: CT HEAD WITHOUT CONTRAST  TECHNIQUE: Contiguous axial images were obtained from the base of the skull through the vertex without intravenous contrast.  COMPARISON:  None.  FINDINGS: No mass lesion, mass effect, midline shift, hydrocephalus, hemorrhage. No acute territorial cortical ischemia/infarct. Atrophy and chronic  ischemic white matter disease is present. Intracranial atherosclerosis. Paranasal sinuses are within normal limits. Bilateral lens extractions.  IMPRESSION: Atrophy and chronic ischemic white matter disease without acute intracranial abnormality.   Electronically Signed   By: Dereck Ligas  M.D.   On: 06-26-14 18:20    ROS:  No recent f/c/n/v/wt loss.  + frequent falls. PE:  Blood pressure 132/55, pulse 82, temperature 97.7 F (36.5 C), temperature source Oral, resp. rate 16, height _0  (1.549 m), weight 89.812 kg (198 lb), SpO2 99 %. Obese elderly female in nad.  A and O x 4.  Mood and affect normal.  EOMI.  resp unlabored.  R LE shortened and ER.  1+ dp and pt pulses.  No lymphadenopathy in the R LE.  5/5 strength in PF and DF of the toes.  Sens to LT intact at the dorsal and plantar foot.  Assessment/Plan: R hip reverse-obliquity subtroch fracture - to OR for IM nailing.  The risks and benefits of the alternative treatment options have been discussed in detail.  The patient wishes to proceed with surgery and specifically understands risks of bleeding, infection, nerve damage, blood clots, need for additional surgery, amputation and death.   Melissa Lambert June 26, 2014, 9:37 PM

## 2014-06-12 NOTE — ED Notes (Signed)
Per EMS- pt had fall, hit head, on coumadin 2 days a week 10mg  , rest of week 5mg . Hx of PE. Pt also noted to have right hip shortening and rotattion. PIV 20G to LA 6mg  Morphine given PTA. Last dose 1615. Pain 4/10.

## 2014-06-12 NOTE — Progress Notes (Signed)
Orthopedic Tech Progress Note Patient Details:  Melissa Lambert 1929-01-14 [id number] Applied ohf to bed Musculoskeletal Traction Type of Traction: Bucks Skin Traction Traction Location: RLE Traction Weight: 5 lbs    Braulio Bosch 06/12/2014, 10:08 PM

## 2014-06-12 NOTE — ED Notes (Signed)
Orthopedic MD at the bedside.

## 2014-06-12 NOTE — H&P (Signed)
[facility] History and Physical  Melissa Lambert KCM:[id number] DOB: 07-28-1929 DOA: 06/12/2014  Referring physician: EDP PCP: Monico Blitz, MD   Chief Complaint: Hip injury   HPI: Melissa Lambert is a 80 y.o. female who suffered a mechanical fall at home just before coming into ED.  Patient had R hip pain after fall, unable to ambulate.  Movement makes pain worse, nothing makes it better.  Brought in to ED by EMS, found to have R proximal femur fracture.  Review of Systems: No chest pain, no h/o COPD nor heart disease that is known, does get SOB when walking up stairs but blames this on lungs.  Has long history of asthma.  Systems reviewed.  As above, otherwise negative  Past Medical History  Diagnosis Date  . Hypertension   . Asthma   . PE (pulmonary embolism)   . Protein C deficiency   . Protein S deficiency    History reviewed. No pertinent past surgical history. Social History:  reports that she has never smoked. She does not have any smokeless tobacco history on file. She reports that she does not drink alcohol or use illicit drugs.  Allergies  Allergen Reactions  . Adhesive [Tape] Itching  . Penicillins Itching, Swelling and Rash    Streptomycin also  . Tuberculin Tests Itching, Swelling and Rash    History reviewed. No pertinent family history.   Prior to Admission medications   Medication Sig Start Date End Date Taking? Authorizing Provider  amLODipine (NORVASC) 2.5 MG tablet Take 2.5-5 mg by mouth 2 (two) times daily. Takes 5mg  in am and 2.5mg  in pm   Yes Historical Provider, MD  Calcium Carbonate-Vit D-Min (CALTRATE 600+D PLUS MINERALS) 600-800 MG-UNIT TABS Take 1 tablet by mouth 2 (two) times daily.   Yes Historical Provider, MD  cetirizine (ZYRTEC) 10 MG tablet Take 10 mg by mouth daily.   Yes Historical Provider, MD  docusate sodium (COLACE) 100 MG capsule Take 400-500 mg by mouth at bedtime.   Yes Historical Provider, MD  omeprazole (PRILOSEC OTC) 20 MG  tablet Take 20 mg by mouth daily.   Yes Historical Provider, MD  oxybutynin (DITROPAN-XL) 5 MG 24 hr tablet Take 5 mg by mouth daily.    Yes Historical Provider, MD  valsartan-hydrochlorothiazide (DIOVAN-HCT) 320-25 MG per tablet Take 1 tablet by mouth daily.   Yes Historical Provider, MD  warfarin (COUMADIN) 5 MG tablet Take 7.5-10 mg by mouth daily at 6 PM. Takes 10mg  on Tues and Sat Takes 7.5mg  all other days   Yes Historical Provider, MD   Physical Exam: Filed Vitals:   06/12/14 1900  BP: 132/55  Pulse: 81  Temp:   Resp:     BP 132/55 mmHg  Pulse 81  Temp(Src) 97.7 F (36.5 C) (Oral)  Resp 16  Ht 5\' 1"  (1.549 m)  Wt 89.812 kg (198 lb)  BMI 37.43 kg/m2  SpO2 92%  General Appearance:    Alert, oriented, no distress, appears stated age  Head:    Normocephalic, atraumatic  Eyes:    PERRL, EOMI, sclera non-icteric        Nose:   Nares without drainage or epistaxis. Mucosa, turbinates normal  Throat:   Moist mucous membranes. Oropharynx without erythema or exudate.  Neck:   Supple. No carotid bruits.  No thyromegaly.  No lymphadenopathy.   Back:     No CVA tenderness, no spinal tenderness  Lungs:     Clear to auscultation bilaterally, without wheezes, rhonchi or rales  Chest wall:    No tenderness to palpitation  Heart:    Regular rate and rhythm without murmurs, gallops, rubs  Abdomen:     Soft, non-tender, nondistended, normal bowel sounds, no organomegaly  Genitalia:    deferred  Rectal:    deferred  Extremities:   No clubbing, cyanosis or edema.  Pulses:   2+ and symmetric all extremities  Skin:   Skin color, texture, turgor normal, no rashes or lesions  Lymph nodes:   Cervical, supraclavicular, and axillary nodes normal  Neurologic:   CNII-XII intact. Normal strength, sensation and reflexes      throughout    Labs on Admission:  Basic Metabolic Panel:  Recent Labs Lab 06/12/14 1734  NA 132*  K 4.3  CL 94*  CO2 28  GLUCOSE 166*  BUN 16  CREATININE 0.74   CALCIUM 9.1   Liver Function Tests:  Recent Labs Lab 06/12/14 1734  AST 26  ALT 12  ALKPHOS 63  BILITOT 0.4  PROT 7.2  ALBUMIN 3.5   No results for input(s): LIPASE, AMYLASE in the last 168 hours. No results for input(s): AMMONIA in the last 168 hours. CBC:  Recent Labs Lab 06/12/14 1734  WBC 5.2  NEUTROABS 3.1  HGB 13.3  HCT 39.1  MCV 88.5  PLT 206   Cardiac Enzymes: No results for input(s): CKTOTAL, CKMB, CKMBINDEX, TROPONINI in the last 168 hours.  BNP (last 3 results) No results for input(s): PROBNP in the last 8760 hours. CBG: No results for input(s): GLUCAP in the last 168 hours.  Radiological Exams on Admission: Dg Chest 1 View  06/12/2014   CLINICAL DATA:  Fall. Initial encounter. RIGHT hip pain radiating to the knee. Preoperative chest radiograph.  EXAM: CHEST - 1 VIEW  COMPARISON:  06/12/2014.  FINDINGS: Cardiomegaly. Tortuous thoracic aorta. LEFT shoulder hemiarthroplasty. RIGHT shoulder osteoarthritis incidentally noted. There is no airspace disease, pneumothorax or displaced rib fractures identified non this frontal view of the chest. Subsegmental atelectasis and/ or scarring is present in the mid chest bilaterally.  IMPRESSION: 1. Cardiomegaly without failure. 2. Scarring or subsegmental atelectasis in the lungs bilaterally.   Electronically Signed   By: Dereck Ligas M.D.   On: 06/12/2014 19:05   Dg Hip Complete Right  06/12/2014   CLINICAL DATA:  Fall and complains of right hip pain. Pain goes down to the right knee.  EXAM: RIGHT HIP - COMPLETE 2+ VIEW  COMPARISON:  None.  FINDINGS: There is a displaced fracture of the proximal right femur. The distal fragment is medially displaced. There is at least a 1/2 shaft width of displacement. This is an oblique fracture which appears to involve the lesser trochanter and subtrochanteric region. Pelvic bony ring is intact. No gross abnormality to the left hip. Right hip is located.  IMPRESSION: Displaced fracture of  the proximal right femur.   Electronically Signed   By: Markus Daft M.D.   On: 06/12/2014 19:06   Ct Head Wo Contrast  06/12/2014   CLINICAL DATA:  Fall. Anticoagulated patient. Head trauma. Initial encounter.  EXAM: CT HEAD WITHOUT CONTRAST  TECHNIQUE: Contiguous axial images were obtained from the base of the skull through the vertex without intravenous contrast.  COMPARISON:  None.  FINDINGS: No mass lesion, mass effect, midline shift, hydrocephalus, hemorrhage. No acute territorial cortical ischemia/infarct. Atrophy and chronic ischemic white matter disease is present. Intracranial atherosclerosis. Paranasal sinuses are within normal limits. Bilateral lens extractions.  IMPRESSION: Atrophy and chronic ischemic white matter disease  without acute intracranial abnormality.   Electronically Signed   By: Dereck Ligas M.D.   On: 06/12/2014 18:20    EKG: Independently reviewed.  Assessment/Plan Principal Problem:   Femur fracture, right Active Problems:   History of pulmonary embolus (PE)   1. R femur fracture - 1. Ortho consulted and planning on taking patient to OR tonight 2. Slight increase of risk due to SOB on exertion, no prior EKG available for comparison.  No known heart disease.  Biggest risk going forward though will be patients risk of thrombus formation (see below). 3. With all that said, feel that it is reasonable to proceed with surgery at this time. 2. History of PE - several years ago, no known active clots at this time but is higher risk given Protein C and S deficiency 1. Coumadin subtheraputic at INR 1.6 today. 2. Put patient back on heparin gtt to bridge to coumadin as soon as is reasonable after surgery. 3. ASA and SCDs ordered for now, DC these when patient on heparin gtt after repair.    Code Status: Full Code  Family Communication: Family at bedside Disposition Plan: Admit to inpatient   Time spent: 70 min  GARDNER, JARED M. [facility] Pager  [phone]  If 7AM-7PM, please contact the day team taking care of the patient [email or web address] Password [email or web address] 06/12/2014, 8:58 PM

## 2014-06-12 NOTE — Anesthesia Preprocedure Evaluation (Addendum)
Anesthesia Evaluation  Patient identified by MRN, date of birth, ID band Patient awake    Reviewed: Allergy & Precautions, H&P , NPO status , Patient's Chart, lab work & pertinent test results  History of Anesthesia Complications Negative for: history of anesthetic complications  Airway Mallampati: II  TM Distance: >3 FB Neck ROM: Full    Dental  (+) Teeth Intact, Dental Advisory Given   Pulmonary asthma (last inhaler 6 months ago) , PE breath sounds clear to auscultation        Cardiovascular hypertension, Pt. on medications - anginaRhythm:Regular Rate:Normal     Neuro/Psych negative neurological ROS     GI/Hepatic Neg liver ROS, GERD-  Medicated and Controlled,  Endo/Other  Morbid obesity  Renal/GU negative Renal ROS     Musculoskeletal   Abdominal (+) + obese,   Peds  Hematology  (+) Blood dyscrasia (Protein C/Protein S deficiency, on Coumadin: INR 1.67), , Protein C and Protein S deficiency   Anesthesia Other Findings   Reproductive/Obstetrics                          Anesthesia Physical Anesthesia Plan  ASA: III and emergent  Anesthesia Plan: General   Post-op Pain Management:    Induction: Intravenous, Rapid sequence and Cricoid pressure planned  Airway Management Planned: Oral ETT  Additional Equipment:   Intra-op Plan:   Post-operative Plan: Extubation in OR  Informed Consent: I have reviewed the patients History and Physical, chart, labs and discussed the procedure including the risks, benefits and alternatives for the proposed anesthesia with the patient or authorized representative who has indicated his/her understanding and acceptance.   Dental advisory given  Plan Discussed with: Anesthesiologist, Surgeon and CRNA  Anesthesia Plan Comments: (Plan routine monitors, GETA)       Anesthesia Quick Evaluation

## 2014-06-13 ENCOUNTER — Encounter (HOSPITAL_COMMUNITY): Payer: Self-pay | Admitting: Orthopedic Surgery

## 2014-06-13 ENCOUNTER — Inpatient Hospital Stay (HOSPITAL_COMMUNITY): Payer: Medicare Other | Admitting: Anesthesiology

## 2014-06-13 ENCOUNTER — Inpatient Hospital Stay (HOSPITAL_COMMUNITY): Payer: Medicare Other

## 2014-06-13 DIAGNOSIS — K219 Gastro-esophageal reflux disease without esophagitis: Secondary | ICD-10-CM

## 2014-06-13 DIAGNOSIS — I10 Essential (primary) hypertension: Secondary | ICD-10-CM

## 2014-06-13 LAB — CBC
HCT: 29.7 % — ABNORMAL LOW (ref 36.0–46.0)
HEMATOCRIT: 33.9 % — AB (ref 36.0–46.0)
HEMOGLOBIN: 11.3 g/dL — AB (ref 12.0–15.0)
Hemoglobin: 9.9 g/dL — ABNORMAL LOW (ref 12.0–15.0)
MCH: 29.7 pg (ref 26.0–34.0)
MCH: 30.2 pg (ref 26.0–34.0)
MCHC: 33.3 g/dL (ref 30.0–36.0)
MCHC: 33.3 g/dL (ref 30.0–36.0)
MCV: 89.2 fL (ref 78.0–100.0)
MCV: 90.5 fL (ref 78.0–100.0)
PLATELETS: 186 10*3/uL (ref 150–400)
Platelets: 171 10*3/uL (ref 150–400)
RBC: 3.8 MIL/uL — ABNORMAL LOW (ref 3.87–5.11)
RBC: 3.28 MIL/uL — AB (ref 3.87–5.11)
RDW: 13.7 % (ref 11.5–15.5)
RDW: 13.9 % (ref 11.5–15.5)
WBC: 8.1 10*3/uL (ref 4.0–10.5)
WBC: 6.2 10*3/uL (ref 4.0–10.5)

## 2014-06-13 LAB — BASIC METABOLIC PANEL
ANION GAP: 10 (ref 5–15)
Anion gap: 10 (ref 5–15)
BUN: 13 mg/dL (ref 6–23)
BUN: 17 mg/dL (ref 6–23)
CALCIUM: 8.3 mg/dL — AB (ref 8.4–10.5)
CALCIUM: 7.9 mg/dL — AB (ref 8.4–10.5)
CO2: 24 meq/L (ref 19–32)
CO2: 25 meq/L (ref 19–32)
Chloride: 92 mEq/L — ABNORMAL LOW (ref 96–112)
Chloride: 97 mEq/L (ref 96–112)
Creatinine, Ser: 0.59 mg/dL (ref 0.50–1.10)
Creatinine, Ser: 0.87 mg/dL (ref 0.50–1.10)
GFR calc Af Amer: >90 mL/min (ref >90)
GFR calc Af Amer: 69 mL/min — ABNORMAL LOW (ref >90)
GFR calc non Af Amer: 82 mL/min — ABNORMAL LOW (ref >90)
GFR, EST NON AFRICAN AMERICAN: 60 mL/min — AB (ref >90)
GLUCOSE: 178 mg/dL — AB (ref 70–99)
Glucose, Bld: 144 mg/dL — ABNORMAL HIGH (ref 70–99)
Potassium: 3.6 mEq/L — ABNORMAL LOW (ref 3.7–5.3)
Potassium: 4.1 mEq/L (ref 3.7–5.3)
Sodium: 126 mEq/L — ABNORMAL LOW (ref 137–147)
Sodium: 132 mEq/L — ABNORMAL LOW (ref 137–147)

## 2014-06-13 LAB — PROTIME-INR
INR: 1.83 — ABNORMAL HIGH (ref 0.00–1.49)
PROTHROMBIN TIME: 21.4 s — AB (ref 11.6–15.2)

## 2014-06-13 MED ORDER — SODIUM CHLORIDE 0.9 % IV BOLUS (SEPSIS)
500.0000 mL | INTRAVENOUS | Status: DC | PRN
  Administered 2014-06-13: 500 mL via INTRAVENOUS
  Administered 2014-06-14: 07:00:00 via INTRAVENOUS
  Filled 2014-06-13: qty 500

## 2014-06-13 MED ORDER — PHENOL 1.4 % MT LIQD: 1.0000 | OROMUCOSAL | Status: DC | PRN

## 2014-06-13 MED ORDER — WARFARIN SODIUM 10 MG PO TABS
10.0000 mg | ORAL_TABLET | Freq: Once | ORAL | Status: AC
  Administered 2014-06-13: 10 mg via ORAL
  Filled 2014-06-13: qty 1

## 2014-06-13 MED ORDER — SODIUM CHLORIDE 0.9 % IV BOLUS (SEPSIS)
500.0000 mL | Freq: Once | INTRAVENOUS | Status: AC
  Administered 2014-06-13: 500 mL via INTRAVENOUS

## 2014-06-13 MED ORDER — SODIUM CHLORIDE 0.9 % IV SOLN
10.0000 mg | INTRAVENOUS | Status: DC | PRN
  Administered 2014-06-13: 50 ug/min via INTRAVENOUS

## 2014-06-13 MED ORDER — ONDANSETRON HCL 4 MG/2ML IJ SOLN: 4.0000 mg | Freq: Four times a day (QID) | INTRAMUSCULAR | Status: DC | PRN

## 2014-06-13 MED ORDER — ONDANSETRON HCL 4 MG/2ML IJ SOLN
INTRAMUSCULAR | Status: AC
  Filled 2014-06-13: qty 2

## 2014-06-13 MED ORDER — SUCCINYLCHOLINE CHLORIDE 20 MG/ML IJ SOLN
INTRAMUSCULAR | Status: DC | PRN
  Administered 2014-06-13: 100 mg via INTRAVENOUS

## 2014-06-13 MED ORDER — ACETAMINOPHEN 650 MG RE SUPP: 650.0000 mg | Freq: Four times a day (QID) | RECTAL | Status: DC | PRN

## 2014-06-13 MED ORDER — CLINDAMYCIN PHOSPHATE 900 MG/50ML IV SOLN
INTRAVENOUS | Status: AC
Start: 2014-06-13 — End: 2014-06-13
  Administered 2014-06-13: 900 mg via INTRAVENOUS
  Filled 2014-06-13: qty 50

## 2014-06-13 MED ORDER — MORPHINE SULFATE 2 MG/ML IJ SOLN
0.5000 mg | INTRAMUSCULAR | Status: DC | PRN
  Administered 2014-06-13: 0.5 mg via INTRAVENOUS
  Filled 2014-06-13: qty 1

## 2014-06-13 MED ORDER — DOCUSATE SODIUM 100 MG PO CAPS
100.0000 mg | ORAL_CAPSULE | Freq: Two times a day (BID) | ORAL | Status: DC
  Administered 2014-06-13 – 2014-06-16 (×7): 100 mg via ORAL
  Filled 2014-06-13 (×8): qty 1

## 2014-06-13 MED ORDER — MENTHOL 3 MG MT LOZG: 1.0000 | LOZENGE | OROMUCOSAL | Status: DC | PRN

## 2014-06-13 MED ORDER — WARFARIN - PHARMACIST DOSING INPATIENT
Freq: Every day | Status: DC
Start: 2014-06-13 — End: 2014-06-16

## 2014-06-13 MED ORDER — SODIUM CHLORIDE 0.9 % IV SOLN
Freq: Once | INTRAVENOUS | Status: AC
  Administered 2014-06-13: 250 mL via INTRAVENOUS

## 2014-06-13 MED ORDER — SODIUM CHLORIDE 0.9 % IV SOLN
INTRAVENOUS | Status: DC
Start: 2014-06-13 — End: 2014-06-13
  Administered 2014-06-13: 03:00:00 via INTRAVENOUS

## 2014-06-13 MED ORDER — 0.9 % SODIUM CHLORIDE (POUR BTL) OPTIME
TOPICAL | Status: DC | PRN
  Administered 2014-06-13: 1000 mL

## 2014-06-13 MED ORDER — ONDANSETRON HCL 4 MG PO TABS
4.0000 mg | ORAL_TABLET | Freq: Four times a day (QID) | ORAL | Status: DC | PRN
  Filled 2014-06-13 (×2): qty 1

## 2014-06-13 MED ORDER — FENTANYL CITRATE 0.05 MG/ML IJ SOLN
INTRAMUSCULAR | Status: AC
  Filled 2014-06-13: qty 2

## 2014-06-13 MED ORDER — ONDANSETRON HCL 4 MG/2ML IJ SOLN
INTRAMUSCULAR | Status: DC | PRN
  Administered 2014-06-13: 4 mg via INTRAVENOUS

## 2014-06-13 MED ORDER — SUCCINYLCHOLINE CHLORIDE 20 MG/ML IJ SOLN
INTRAMUSCULAR | Status: AC
  Filled 2014-06-13: qty 1

## 2014-06-13 MED ORDER — PROPOFOL 10 MG/ML IV BOLUS
INTRAVENOUS | Status: DC | PRN
  Administered 2014-06-13: 80 mg via INTRAVENOUS

## 2014-06-13 MED ORDER — SODIUM CHLORIDE 0.9 % IV SOLN
INTRAVENOUS | Status: DC
  Administered 2014-06-13: 10:00:00 via INTRAVENOUS

## 2014-06-13 MED ORDER — HYDROCODONE-ACETAMINOPHEN 5-325 MG PO TABS
1.0000 | ORAL_TABLET | Freq: Four times a day (QID) | ORAL | Status: DC | PRN
  Administered 2014-06-13 – 2014-06-14 (×3): 1 via ORAL
  Administered 2014-06-15: 2 via ORAL
  Filled 2014-06-13 (×3): qty 1
  Filled 2014-06-13: qty 2

## 2014-06-13 MED ORDER — METOCLOPRAMIDE HCL 5 MG/ML IJ SOLN: 5.0000 mg | Freq: Three times a day (TID) | INTRAMUSCULAR | Status: DC | PRN

## 2014-06-13 MED ORDER — FENTANYL CITRATE 0.05 MG/ML IJ SOLN
INTRAMUSCULAR | Status: DC | PRN
  Administered 2014-06-13 (×2): 50 ug via INTRAVENOUS

## 2014-06-13 MED ORDER — LACTATED RINGERS IV SOLN
INTRAVENOUS | Status: DC | PRN
  Administered 2014-06-13 (×2): via INTRAVENOUS

## 2014-06-13 MED ORDER — METOCLOPRAMIDE HCL 5 MG PO TABS
5.0000 mg | ORAL_TABLET | Freq: Three times a day (TID) | ORAL | Status: DC | PRN
  Filled 2014-06-13: qty 2

## 2014-06-13 MED ORDER — ACETAMINOPHEN 325 MG PO TABS
650.0000 mg | ORAL_TABLET | Freq: Four times a day (QID) | ORAL | Status: DC | PRN
  Administered 2014-06-14: 650 mg via ORAL
  Filled 2014-06-13: qty 2

## 2014-06-13 MED ORDER — LIDOCAINE HCL (CARDIAC) 20 MG/ML IV SOLN
INTRAVENOUS | Status: AC
  Filled 2014-06-13: qty 5

## 2014-06-13 MED ORDER — SODIUM CHLORIDE 0.9 % IV SOLN
INTRAVENOUS | Status: DC
  Administered 2014-06-13 – 2014-06-14 (×2): via INTRAVENOUS

## 2014-06-13 MED ORDER — SENNA 8.6 MG PO TABS
2.0000 | ORAL_TABLET | Freq: Two times a day (BID) | ORAL | Status: DC
  Administered 2014-06-13 – 2014-06-16 (×7): 17.2 mg via ORAL
  Filled 2014-06-13 (×8): qty 2

## 2014-06-13 MED ORDER — EPHEDRINE SULFATE 50 MG/ML IJ SOLN
INTRAMUSCULAR | Status: DC | PRN
  Administered 2014-06-13 (×3): 10 mg via INTRAVENOUS
  Administered 2014-06-13 (×2): 5 mg via INTRAVENOUS

## 2014-06-13 NOTE — Anesthesia Procedure Notes (Signed)
Procedure Name: Intubation Date/Time: 06/13/2014 12:22 AM Performed by: Valetta Fuller Pre-anesthesia Checklist: Patient identified, Emergency Drugs available, Suction available and Patient being monitored Patient Re-evaluated:Patient Re-evaluated prior to inductionOxygen Delivery Method: Circle system utilized Preoxygenation: Pre-oxygenation with 100% oxygen Intubation Type: IV induction, Rapid sequence and Cricoid Pressure applied Laryngoscope Size: Miller and 2 Grade View: Grade I Tube type: Oral Tube size: 7.5 mm Number of attempts: 1 Airway Equipment and Method: Stylet Placement Confirmation: ETT inserted through vocal cords under direct vision,  positive ETCO2 and breath sounds checked- equal and bilateral Secured at: 22 cm Tube secured with: Tape Dental Injury: Teeth and Oropharynx as per pre-operative assessment

## 2014-06-13 NOTE — Progress Notes (Signed)
Utilization review completed.  

## 2014-06-13 NOTE — Plan of Care (Signed)
Problem: Phase II Progression Outcomes Goal: CMS/Neurovascular status WDL Outcome: Completed/Met Date Met:  06/13/14 Goal: Tolerating diet Outcome: Completed/Met Date Met:  06/13/14 Goal: Discharge plan established Outcome: Completed/Met Date Met:  06/13/14

## 2014-06-13 NOTE — Op Note (Signed)
NAMEADRIELLE, POLAKOWSKI NO.:  [id number]  MEDICAL RECORD NO.:  [id number]  LOCATION:  [facility]                         FACILITY:  [facility]  PHYSICIAN:  Wylene Simmer, MD        DATE OF BIRTH:  10/24/1928  DATE OF PROCEDURE:  06/13/2014 DATE OF DISCHARGE:                              OPERATIVE REPORT   PREOPERATIVE DIAGNOSIS:  Right subtrochanteric femur fracture.  POSTOPERATIVE DIAGNOSIS:  Right subtrochanteric femur fracture.  PROCEDURE:  Open treatment of right subtrochanteric femur fracture with intramedullary nailing.  SURGEON:  Wylene Simmer, MD  ANESTHESIA:  General.  ESTIMATED BLOOD LOSS:  250 mL.  COMPLICATIONS:  None apparent.  DISPOSITION:  Extubated, awake, and stable to recovery.  INDICATIONS FOR PROCEDURE:  The patient is an 80 year old female who fell at home earlier this evening.  She came to the emergency department via EMS.  She was found to have a subtrochanteric femur fracture that was closed and displaced.  She presents now for operative treatment of this unstable injury.  She understands the risks and benefits, the alternative treatment options, and elects surgical treatment.  She specifically understands risks of bleeding, infection, nerve damage, blood clots, need for additional surgery, continued pain, amputation, and death.  PROCEDURE IN DETAIL:  After preoperative consent was obtained and the correct operative site was identified, the patient was brought to the operating room supine on a hospital bed.  General anesthesia was induced.  Preoperative antibiotics were administered.  Surgical time-out was taken.  The patient was then moved over onto the fracture table. The right lower extremity was placed in a traction boot and the left lower extremity was placed in a well leg holder.  Reduction maneuver was performed.  An AP and lateral radiographs confirmed appropriate reduction of the fracture.  The right lower extremity was then  prepped and draped in standard sterile fashion.  A longitudinal incision was then made at the tip of the greater trochanter.  Sharp dissection was carried down through the skin and subcutaneous tissue and iliotibial band.  The tip of the greater trochanter was identified.  AP and lateral radiographs were used to insert a guide pin.  The guide pin was then used to open the canal with a starter reamer.  A ball-tip guidewire was then introduced into the femoral canal and advanced to the level of the superior pole of the patella.  AP and lateral radiographs confirmed appropriate position of the guide pin and appropriate reduction of the fracture.  The medullary canal was then sequentially reamed to 12-1/2 mm.  An 11 mm x 360 mm Biomet Affixus nail was selected.  It was inserted over the guidewire and the guidewire was removed.  The targeting jig was then used to insert a lag screw through the nail into the center portion of the femoral head.  AP and lateral radiographs confirmed appropriate position of the lag screw.  The lag screw was then securely fastened to the nail with the set screw.  The distal aspect of the screw was then secured with a single interlocking screw using the percutaneous perfect circle technique.  AP and lateral radiographs confirmed appropriate reduction of the fracture, appropriate position  and length of all hardware.  The wounds were then irrigated copiously. The subcutaneous tissue was approximated with Monocryl and skin incisions were closed with staples.  Sterile dressings were applied. The patient was then awakened from anesthesia and transported to the recovery room in stable condition.  FOLLOWUP PLAN:  The patient will be weightbearing as tolerated on the right lower extremity.  She will continue Coumadin for DVT prophylaxis and will be admitted for physical therapy, occupational therapy, and discharge planning.     Wylene Simmer, MD     JH/MEDQ  D:   06/13/2014  T:  06/13/2014  Job:  [id number]

## 2014-06-13 NOTE — Progress Notes (Addendum)
PATIENT DETAILS Name: Melissa Lambert Age: 80 y.o. Sex: female Date of Birth: August 16, 1928 Admit Date: 06/12/2014 Admitting Physician Etta Quill, DO LFY:BOFB,PZWCHE, MD  Subjective: Pain at operative site. No other complaints  Assessment/Plan: Principal Problem:   Femur fracture, right:s/p Intramedullary nailing on 06/13/14. On coumadin chronically, PT eval pending, remove Foley either today or in am. Suspect will need SNF, however patient/family prefer HHPT.  Active Problems:   Orthostatic Hypotension:occured post operatively-secondary to ?anesthetic agents, blood loss, narcotics- stop all anti-hypertensive. Hydrate with IVF, follow.   Hyponatremia:secondary to HCTZ,dehydration. Stop HCTZ, Hydrate and follow lytes.   HTN:All anti-hypertensive's on hold, due to above.   GERD:stable, continue PPI     History of pulmonary embolus (PE):coumadin per pharmacy, INR 1.83.  Disposition: Remain inpatient-await PT eval-SNF vs HHPT  Antibiotics:  IV Clindamycin x 1 06/13/14  DVT Prophylaxis: On coumadin  Code Status: Full code   Family Communication 2 sisters at bedside  Procedures: 06/13/14-Open treatment of right subtrochanteric femur fracture withintramedullary nailing  CONSULTS:  orthopedic surgery  Time spent 40 minutes-which includes 50% of the time with face-to-face with patient/ family and coordinating care related to the above assessment and plan.   MEDICATIONS: Scheduled Meds: . aspirin EC  325 mg Oral Daily  . calcium-vitamin D  1 tablet Oral BID  . docusate sodium  100 mg Oral BID  . fentaNYL      . loratadine  10 mg Oral Daily  . oxybutynin  5 mg Oral Daily  . pantoprazole  40 mg Oral Daily  . senna  2 tablet Oral BID  . warfarin  10 mg Oral ONCE-1800  . Warfarin - Pharmacist Dosing Inpatient   Does not apply q1800   Continuous Infusions: . sodium chloride     PRN Meds:.acetaminophen **OR** acetaminophen, HYDROcodone-acetaminophen,  menthol-cetylpyridinium **OR** phenol, metoCLOPramide **OR** metoCLOPramide (REGLAN) injection, morphine injection, ondansetron **OR** ondansetron (ZOFRAN) IV  Antibiotics: Anti-infectives    Start     Dose/Rate Route Frequency Ordered Stop   06/13/14 0010  clindamycin (CLEOCIN) 900 MG/50ML IVPB    Comments:  Ubaldo Glassing   : cabinet override      06/13/14 0010 06/13/14 0012       PHYSICAL EXAM: Vital signs in last 24 hours: Filed Vitals:   06/13/14 0618 06/13/14 0900 06/13/14 0940 06/13/14 0942  BP: 94/46 82/40 80/42  75/38  Pulse: 75 75    Temp: 97.4 F (36.3 C)     TempSrc:      Resp:      Height:      Weight:      SpO2: 94% 95%      Weight change:  Filed Weights   06/12/14 1715  Weight: 89.812 kg (198 lb)   Body mass index is 37.43 kg/(m^2).   Gen Exam: Awake and alert with clear speech.   Neck: Supple, No JVD.   Chest: B/L Clear.   CVS: S1 S2 Regular, no murmurs.  Abdomen: soft, BS +, non tender, non distended.  Extremities: no edema, lower extremities warm to touch. Neurologic: Non Focal.   Skin: No Rash.   Wounds: N/A.   Intake/Output from previous day:  Intake/Output Summary (Last 24 hours) at 06/13/14 1022 Last data filed at 06/13/14 0800  Gross per 24 hour  Intake   1865 ml  Output    825 ml  Net   1040 ml     LAB RESULTS: CBC  Recent Labs Lab 06/12/14 1734  06/13/14 0252  WBC 5.2 8.1  HGB 13.3 11.3*  HCT 39.1 33.9*  PLT 206 171  MCV 88.5 89.2  MCH 30.1 29.7  MCHC 34.0 33.3  RDW 13.7 13.7  LYMPHSABS 1.6  --   MONOABS 0.4  --   EOSABS 0.1  --   BASOSABS 0.0  --     Chemistries   Recent Labs Lab 06/12/14 1734 06/13/14 0252  NA 132* 126*  K 4.3 3.6*  CL 94* 92*  CO2 28 24  GLUCOSE 166* 178*  BUN 16 13  CREATININE 0.74 0.59  CALCIUM 9.1 8.3*    CBG: No results for input(s): GLUCAP in the last 168 hours.  GFR Estimated Creatinine Clearance: 54.3 mL/min (by C-G formula based on Cr of 0.59).  Coagulation  profile  Recent Labs Lab 06/12/14 1734 06/13/14 0318  INR 1.67* 1.83*    Cardiac Enzymes No results for input(s): CKMB, TROPONINI, MYOGLOBIN in the last 168 hours.  Invalid input(s): CK  Invalid input(s): POCBNP No results for input(s): DDIMER in the last 72 hours. No results for input(s): HGBA1C in the last 72 hours. No results for input(s): CHOL, HDL, LDLCALC, TRIG, CHOLHDL, LDLDIRECT in the last 72 hours. No results for input(s): TSH, T4TOTAL, T3FREE, THYROIDAB in the last 72 hours.  Invalid input(s): FREET3 No results for input(s): VITAMINB12, FOLATE, FERRITIN, TIBC, IRON, RETICCTPCT in the last 72 hours. No results for input(s): LIPASE, AMYLASE in the last 72 hours.  Urine Studies No results for input(s): UHGB, CRYS in the last 72 hours.  Invalid input(s): UACOL, UAPR, USPG, UPH, UTP, UGL, UKET, UBIL, UNIT, UROB, ULEU, UEPI, UWBC, URBC, UBAC, CAST, UCOM, BILUA  MICROBIOLOGY: No results found for this or any previous visit (from the past 240 hour(s)).  RADIOLOGY STUDIES/RESULTS: Dg Chest 1 View  06/12/2014   CLINICAL DATA:  Fall. Initial encounter. RIGHT hip pain radiating to the knee. Preoperative chest radiograph.  EXAM: CHEST - 1 VIEW  COMPARISON:  06/12/2014.  FINDINGS: Cardiomegaly. Tortuous thoracic aorta. LEFT shoulder hemiarthroplasty. RIGHT shoulder osteoarthritis incidentally noted. There is no airspace disease, pneumothorax or displaced rib fractures identified non this frontal view of the chest. Subsegmental atelectasis and/ or scarring is present in the mid chest bilaterally.  IMPRESSION: 1. Cardiomegaly without failure. 2. Scarring or subsegmental atelectasis in the lungs bilaterally.   Electronically Signed   By: Dereck Ligas M.D.   On: 06/12/2014 19:05   Dg Hip Complete Right  06/12/2014   CLINICAL DATA:  Fall and complains of right hip pain. Pain goes down to the right knee.  EXAM: RIGHT HIP - COMPLETE 2+ VIEW  COMPARISON:  None.  FINDINGS: There is a  displaced fracture of the proximal right femur. The distal fragment is medially displaced. There is at least a 1/2 shaft width of displacement. This is an oblique fracture which appears to involve the lesser trochanter and subtrochanteric region. Pelvic bony ring is intact. No gross abnormality to the left hip. Right hip is located.  IMPRESSION: Displaced fracture of the proximal right femur.   Electronically Signed   By: Markus Daft M.D.   On: 06/12/2014 19:06   Dg Femur Right  06/13/2014   CLINICAL DATA:  Femur fracture, pain, initial encounter.  EXAM: RIGHT FEMUR - 2 VIEW; DG C-ARM 61-120 MIN  COMPARISON:  06/12/2014 radiographs  FINDINGS: C-arm films document placement of an intramedullary rod with compression screw across a proximal femur fracture. Improved position and alignment.  IMPRESSION: As above.   Electronically  Signed   By: Rolla Flatten M.D.   On: 06/13/2014 07:11   Ct Head Wo Contrast  06/12/2014   CLINICAL DATA:  Fall. Anticoagulated patient. Head trauma. Initial encounter.  EXAM: CT HEAD WITHOUT CONTRAST  TECHNIQUE: Contiguous axial images were obtained from the base of the skull through the vertex without intravenous contrast.  COMPARISON:  None.  FINDINGS: No mass lesion, mass effect, midline shift, hydrocephalus, hemorrhage. No acute territorial cortical ischemia/infarct. Atrophy and chronic ischemic white matter disease is present. Intracranial atherosclerosis. Paranasal sinuses are within normal limits. Bilateral lens extractions.  IMPRESSION: Atrophy and chronic ischemic white matter disease without acute intracranial abnormality.   Electronically Signed   By: Dereck Ligas M.D.   On: 06/12/2014 18:20   Dg C-arm 1-60 Min  06/13/2014   CLINICAL DATA:  Femur fracture, pain, initial encounter.  EXAM: RIGHT FEMUR - 2 VIEW; DG C-ARM 61-120 MIN  COMPARISON:  06/12/2014 radiographs  FINDINGS: C-arm films document placement of an intramedullary rod with compression screw across a proximal  femur fracture. Improved position and alignment.  IMPRESSION: As above.   Electronically Signed   By: Rolla Flatten M.D.   On: 06/13/2014 07:11    Oren Binet, MD  [facility] Pager:[phone]  If 7PM-7AM, please contact night-coverage [email or web address] Password [email or web address] 06/13/2014, 10:22 AM   LOS: 1 day

## 2014-06-13 NOTE — Progress Notes (Signed)
Subjective: 1 Day Post-Op Procedure(s) (LRB): INTRAMEDULLARY (IM) NAIL FEMORAL (Right) Patient reports pain as mild.  She reports pain that is well controlled.  Her biggest complaint currently is feeling nauseated. She has been nauseated for the past 2 hours, but it is improving. She denies any episodes of emesis. She does c/o mild abdominal pain to the lower left and right abdomen. She states that she thinks it is related to constipation; she reports regular bouts of constipation at home as well. She had some hypotension issues earlier today, and was given a 500cc bolus of fluid; she is currently in that treatment now.  PT has not been by to evaluate due to hypotension. She denies any HA, CP, SOB, lightheadedness, vomiting, fever, chills, leg swelling or calf pain. She is currently on coumadin for prior PEs.   Objective: Vital signs in last 24 hours: Temp:  [97.4 F (36.3 C)-98.6 F (37 C)] 97.4 F (36.3 C) (11/02 0618) Pulse Rate:  [63-84] 75 (11/02 0900) Resp:  [11-27] 16 (11/02 0503) BP: (75-160)/(38-76) 75/38 mmHg (11/02 0942) SpO2:  [82 %-100 %] 95 % (11/02 0900) Weight:  [89.812 kg (198 lb)] 89.812 kg (198 lb) (11/01 1715)  Intake/Output from previous day: 11/01 0701 - 11/02 0700 In: 1625 [I.V.:1625] Out: 825 [Urine:575; Blood:250] Intake/Output this shift: Total I/O In: 240 [P.O.:240] Out: -    Recent Labs  06/12/14 1734 06/13/14 0252  HGB 13.3 11.3*    Recent Labs  06/12/14 1734 06/13/14 0252  WBC 5.2 8.1  RBC 4.42 3.80*  HCT 39.1 33.9*  PLT 206 171    Recent Labs  06/12/14 1734 06/13/14 0252  NA 132* 126*  K 4.3 3.6*  CL 94* 92*  CO2 28 24  BUN 16 13  CREATININE 0.74 0.59  GLUCOSE 166* 178*  CALCIUM 9.1 8.3*    Recent Labs  06/12/14 1734 06/13/14 0318  INR 1.67* 1.83*    Physical Exam:  WD/WN caucasian female in nad. A and O x4. Mood and affect appropriate. EOMI. Respirations normal and unlabored with [place] in place. Abdomen soft and non-tender.  R hip Dressings C/D/I. NV intact with brisk capillary refill and 5/5 strength of toe extensors and flexors bilaterally. Distal sensation intact bilaterally. No lymphadenopathy. No calf pain or palpable cords.    Assessment/Plan: 1 Day Post-Op Procedure(s) (LRB): INTRAMEDULLARY (IM) NAIL FEMORAL (Right) Up with therapy; WBAT to RLE -Continue hip precautions -Continue current pain medication regimen for pain control as long as BP is stable -Continue coumadin for prior PE and DVT prophylaxis  -Continue with tx recs per IM team -D/C planning per CSM and CSW; pt and family prefer to go home, but understands SNF is possible   Melissa Lambert 06/13/2014, 1:17 PM  [phone]

## 2014-06-13 NOTE — Progress Notes (Signed)
Most recent 77/40 Left arm manually. 76/42 left arm automatic. Dr Sloan Leiter notified, ordered 563ml bolus and stat CBC. Pt has no complaints other than being lethargic. Will continue to monitor.

## 2014-06-13 NOTE — Progress Notes (Signed)
Pt BP 95/40 after 535ml bolus. MD made aware and second 581ml bolus ordered. Will continue to monitor.

## 2014-06-13 NOTE — Anesthesia Postprocedure Evaluation (Signed)
  Anesthesia Post-op Note  Patient: Product/process development scientist  Procedure(s) Performed: Procedure(s): INTRAMEDULLARY (IM) NAIL FEMORAL (Right)  Patient Location: PACU  Anesthesia Type:General  Level of Consciousness: awake, alert , oriented and patient cooperative  Airway and Oxygen Therapy: Patient Spontanous Breathing and Patient connected to nasal cannula oxygen  Post-op Pain: mild, able to doze  Post-op Assessment: Post-op Vital signs reviewed, Patient's Cardiovascular Status Stable, Respiratory Function Stable, Patent Airway, No signs of Nausea or vomiting and Pain level controlled  Post-op Vital Signs: Reviewed and stable  Last Vitals:  Filed Vitals:   06/13/14 0503  BP: 93/42  Pulse: 74  Temp:   Resp: 16    Complications: No apparent anesthesia complications

## 2014-06-13 NOTE — Transfer of Care (Signed)
Immediate Anesthesia Transfer of Care Note  Patient: Melissa Lambert  Procedure(s) Performed: Procedure(s): INTRAMEDULLARY (IM) NAIL FEMORAL (Right)  Patient Location: PACU  Anesthesia Type:General  Level of Consciousness: sedated and patient cooperative  Airway & Oxygen Therapy: Patient connected to face mask oxygen  Post-op Assessment: Report given to PACU RN and Post -op Vital signs reviewed and stable  Post vital signs: Reviewed and stable  Complications: No apparent anesthesia complications

## 2014-06-13 NOTE — Progress Notes (Signed)
Patient BP low this morning upon assessment. Manual BP 80/42. Patient reports being asymptomatic. MD notified, ordered 165ml NS over 5 hours. Will continue to monitor.

## 2014-06-13 NOTE — Brief Op Note (Signed)
06/12/2014 - 06/13/2014  2:14 AM  PATIENT:  Melissa Lambert  80 y.o. female  PRE-OPERATIVE DIAGNOSIS:  Right subtrochanter femur fracture  POST-OPERATIVE DIAGNOSIS:  Right subtrochanter femur fracture  Procedure(s):  Open treatment of right subtrochanteric femur fracture with intramedullary nailing  SURGEON:  Wylene Simmer, MD  ASSISTANT: n/a  ANESTHESIA:   General  EBL:  250 cc  TOURNIQUET:  n/a  COMPLICATIONS:  None apparent  DISPOSITION:  Extubated, awake and stable to recovery.  DICTATION ID:   [id number]

## 2014-06-13 NOTE — Progress Notes (Signed)
INITIAL NUTRITION ASSESSMENT  DOCUMENTATION CODES Per approved criteria  -Obesity Unspecified   INTERVENTION: Provide nourishment snacks (cheese crackers/peanut butter crackers). Ordered.  Encourage PO intake.  NUTRITION DIAGNOSIS: Increased nutrient needs related to s/p surgery as evidenced by estimated nutrition needs.  Goal: Pt to meet >/= 90% of their estimated nutrition needs   Monitor:  PO intake, weight trends, labs, I/O's  Reason for Assessment: MD consult   80 y.o. female  Admitting Dx: Femur fracture, right  ASSESSMENT: Pt who suffered a mechanical fall at home just before coming into ED.Patient had R hip pain after fall, unable to ambulate. Movement makes pain worse, nothing makes it better.Brought in to ED by EMS, found to have R proximal femur fracture.  Procedure (11/2): Open treatment of right subtrochanteric femur fracture with intramedullary nailing.  Pt reports her appetite is "ok" right now. Pt reports she had a great appetite at home eating fine. Pt reports her weight has been stable. Current meal completion is 30%. Pt reports she does not want any oral supplements but is agreeable to nourishment snacks. Pt reports liking cheese/peanut butter crackers. Will order. Pt was encouraged to eat her food at meals.   Pt with no significant fat or muscle mass loss.   Labs: Low sodium, potassium, chloride, and GFR.  Height: Ht Readings from Last 1 Encounters:  06/12/14 5\' 1"  (1.549 m)    Weight: Wt Readings from Last 1 Encounters:  06/12/14 198 lb (89.812 kg)    Ideal Body Weight: 105 lbs  % Ideal Body Weight: 189%  Wt Readings from Last 10 Encounters:  06/12/14 198 lb (89.812 kg)    Usual Body Weight: 200 lbs  % Usual Body Weight: 99%  BMI:  Body mass index is 37.43 kg/(m^2). Class II obesity  Estimated Nutritional Needs: Kcal: 1800-2000 Protein: 85-95 grams Fluid: 1.8 - 2 L/day  Skin: incision right hip, non-pitting RLE edema  Diet  Order: Diet Carb Modified  EDUCATION NEEDS: -No education needs identified at this time   Intake/Output Summary (Last 24 hours) at 06/13/14 0932 Last data filed at 06/13/14 0800  Gross per 24 hour  Intake   1865 ml  Output    825 ml  Net   1040 ml    Last BM: 11/1  Labs:   Recent Labs Lab 06/12/14 1734 06/13/14 0252  NA 132* 126*  K 4.3 3.6*  CL 94* 92*  CO2 28 24  BUN 16 13  CREATININE 0.74 0.59  CALCIUM 9.1 8.3*  GLUCOSE 166* 178*    CBG (last 3)  No results for input(s): GLUCAP in the last 72 hours.  Scheduled Meds: . amLODipine  5 mg Oral Daily  . aspirin EC  325 mg Oral Daily  . calcium-vitamin D  1 tablet Oral BID  . docusate sodium  100 mg Oral BID  . fentaNYL      . loratadine  10 mg Oral Daily  . oxybutynin  5 mg Oral Daily  . pantoprazole  40 mg Oral Daily  . senna  2 tablet Oral BID  . warfarin  10 mg Oral ONCE-1800  . Warfarin - Pharmacist Dosing Inpatient   Does not apply q1800    Continuous Infusions: . sodium chloride 75 mL/hr at 06/13/14 6811    Past Medical History  Diagnosis Date  . Hypertension   . Asthma   . PE (pulmonary embolism)   . Protein C deficiency   . Protein S deficiency     History  reviewed. No pertinent past surgical history.  Kallie Locks, MS, RD, LDN Pager # [phone] After hours/ weekend pager # [phone]

## 2014-06-13 NOTE — Progress Notes (Signed)
ANTICOAGULATION CONSULT NOTE - Follow Up Consult  Pharmacy Consult for Coumadin Indication: VTE prophylaxis  Allergies  Allergen Reactions  . Adhesive [Tape] Itching  . Penicillins Itching, Swelling and Rash    Streptomycin also  . Tuberculin Tests Itching, Swelling and Rash    Patient Measurements: Height: 5\' 1"  (154.9 cm) Weight: 198 lb (89.812 kg) IBW/kg (Calculated) : 47.8  Vital Signs: Temp: 97.5 F (36.4 C) (11/02 0224) Temp Source: Oral (11/01 2211) BP: 81/62 mmHg (11/02 0235) Pulse Rate: 76 (11/02 0235)  Labs:  Recent Labs  06/12/14 1734  HGB 13.3  HCT 39.1  PLT 206  LABPROT 19.9*  INR 1.67*  CREATININE 0.74    Estimated Creatinine Clearance: 54.3 mL/min (by C-G formula based on Cr of 0.74).   Medications:  Prescriptions prior to admission  Medication Sig Dispense Refill Last Dose  . amLODipine (NORVASC) 2.5 MG tablet Take 2.5-5 mg by mouth 2 (two) times daily. Takes 5mg  in am and 2.5mg  in pm   06/12/2014 at Unknown time  . Calcium Carbonate-Vit D-Min (CALTRATE 600+D PLUS MINERALS) 600-800 MG-UNIT TABS Take 1 tablet by mouth 2 (two) times daily.   06/12/2014 at Unknown time  . cetirizine (ZYRTEC) 10 MG tablet Take 10 mg by mouth daily.   06/12/2014 at Unknown time  . docusate sodium (COLACE) 100 MG capsule Take 400-500 mg by mouth at bedtime.   06/11/2014 at Unknown time  . omeprazole (PRILOSEC OTC) 20 MG tablet Take 20 mg by mouth daily.   06/12/2014 at Unknown time  . oxybutynin (DITROPAN-XL) 5 MG 24 hr tablet Take 5 mg by mouth daily.    06/12/2014 at Unknown time  . valsartan-hydrochlorothiazide (DIOVAN-HCT) 320-25 MG per tablet Take 1 tablet by mouth daily.   06/12/2014 at Unknown time  . warfarin (COUMADIN) 5 MG tablet Take 7.5-10 mg by mouth daily at 6 PM. Takes 10mg  on Tues and Sat Takes 7.5mg  all other days   06/11/2014 at Unknown time    Assessment: 80 yo female admitted with right femur fracture s/p repair, h/o PE on Coumadin as above at home, for  anticoagulation  Goal of Therapy:  INR 2-3 Monitor platelets by anticoagulation protocol: Yes   Plan:  Coumadin 10 mg tonight F/U daily INR  Daley Mooradian, Bronson Curb 06/13/2014,3:01 AM

## 2014-06-14 DIAGNOSIS — I959 Hypotension, unspecified: Secondary | ICD-10-CM

## 2014-06-14 DIAGNOSIS — S7291XD Unspecified fracture of right femur, subsequent encounter for closed fracture with routine healing: Secondary | ICD-10-CM

## 2014-06-14 DIAGNOSIS — E871 Hypo-osmolality and hyponatremia: Secondary | ICD-10-CM

## 2014-06-14 DIAGNOSIS — D62 Acute posthemorrhagic anemia: Secondary | ICD-10-CM

## 2014-06-14 LAB — CBC
HCT: 27.9 % — ABNORMAL LOW (ref 36.0–46.0)
Hemoglobin: 9.4 g/dL — ABNORMAL LOW (ref 12.0–15.0)
MCH: 30 pg (ref 26.0–34.0)
MCHC: 33.7 g/dL (ref 30.0–36.0)
MCV: 89.1 fL (ref 78.0–100.0)
PLATELETS: 165 10*3/uL (ref 150–400)
RBC: 3.13 MIL/uL — ABNORMAL LOW (ref 3.87–5.11)
RDW: 14.1 % (ref 11.5–15.5)
WBC: 5.8 10*3/uL (ref 4.0–10.5)

## 2014-06-14 LAB — BASIC METABOLIC PANEL
ANION GAP: 9 (ref 5–15)
BUN: 17 mg/dL (ref 6–23)
CALCIUM: 8.1 mg/dL — AB (ref 8.4–10.5)
CO2: 24 mEq/L (ref 19–32)
CREATININE: 0.75 mg/dL (ref 0.50–1.10)
Chloride: 96 mEq/L (ref 96–112)
GFR calc Af Amer: 88 mL/min — ABNORMAL LOW (ref >90)
GFR calc non Af Amer: 76 mL/min — ABNORMAL LOW (ref >90)
Glucose, Bld: 127 mg/dL — ABNORMAL HIGH (ref 70–99)
Potassium: 4 mEq/L (ref 3.7–5.3)
SODIUM: 129 meq/L — AB (ref 137–147)

## 2014-06-14 LAB — PROTIME-INR
INR: 2.08 — ABNORMAL HIGH (ref 0.00–1.49)
Prothrombin Time: 23.6 seconds — ABNORMAL HIGH (ref 11.6–15.2)

## 2014-06-14 MED ORDER — ALBUTEROL SULFATE (2.5 MG/3ML) 0.083% IN NEBU: 2.5000 mg | INHALATION_SOLUTION | RESPIRATORY_TRACT | Status: DC | PRN

## 2014-06-14 MED ORDER — WARFARIN SODIUM 7.5 MG PO TABS
7.5000 mg | ORAL_TABLET | Freq: Once | ORAL | Status: AC
  Administered 2014-06-14: 7.5 mg via ORAL
  Filled 2014-06-14: qty 1

## 2014-06-14 MED ORDER — HYDROCODONE-ACETAMINOPHEN 5-325 MG PO TABS: 1.0000 | ORAL_TABLET | Freq: Four times a day (QID) | ORAL | Status: DC | PRN

## 2014-06-14 NOTE — Consult Note (Signed)
Physical Medicine and Rehabilitation Consult Reason for Consult:Right subtrochanteric femur fracture Referring Physician: [facility]   HPI: Melissa Lambert is a 80 y.o.right handed female with history of protein C and protein S deficiency maintained on chronic Coumadin. Patient lives alone and used a cane/walker prior to admission. Her sister lives next door and provides assistance as needed Admitted 06/12/2014 after mechanical fall at home landing on her right side without loss of consciousness.Cranial CT scan negative for acute changes. X-rays and imaging revealed right subtrochanteric femur fracture. Underwent open treatment of right subtrochanteric femur fracture with intramedullary nailing 06/13/2014 per Dr. Doran Durand. Weightbearing as tolerated right lower extremity. Hospital course pain management. Chronic Coumadin resumed. Acute blood loss anemia 9.4 and monitored. Physical and occupational therapy evaluation completed 06/14/2014 with recommendations of physical medicine rehabilitation consult.  Patient doing well from a pain standpoint. Sister is in the room, states that she can provide 24-hour assistance postdischarge. Review of Systems  Cardiovascular: Positive for leg swelling.  Gastrointestinal: Positive for constipation.       GERD  Musculoskeletal: Positive for myalgias and falls.  All other systems reviewed and are negative.  Past Medical History  Diagnosis Date  . Hypertension   . Asthma   . PE (pulmonary embolism)   . Protein C deficiency   . Protein S deficiency    Past Surgical History  Procedure Laterality Date  . Femur im nail Right 06/12/2014    Procedure: INTRAMEDULLARY (IM) NAIL FEMORAL;  Surgeon: Wylene Simmer, MD;  Location: [facility];  Service: Orthopedics;  Laterality: Right;   History reviewed. No pertinent family history. Social History:  reports that she has never smoked. She does not have any smokeless tobacco history on file. She reports that she does not  drink alcohol or use illicit drugs. Allergies:  Allergies  Allergen Reactions  . Adhesive [Tape] Itching  . Penicillins Itching, Swelling and Rash    Streptomycin also  . Tuberculin Tests Itching, Swelling and Rash   Medications Prior to Admission  Medication Sig Dispense Refill  . amLODipine (NORVASC) 2.5 MG tablet Take 2.5-5 mg by mouth 2 (two) times daily. Takes 5mg  in am and 2.5mg  in pm    . Calcium Carbonate-Vit D-Min (CALTRATE 600+D PLUS MINERALS) 600-800 MG-UNIT TABS Take 1 tablet by mouth 2 (two) times daily.    . cetirizine (ZYRTEC) 10 MG tablet Take 10 mg by mouth daily.    Marland Kitchen docusate sodium (COLACE) 100 MG capsule Take 400-500 mg by mouth at bedtime.    Marland Kitchen omeprazole (PRILOSEC OTC) 20 MG tablet Take 20 mg by mouth daily.    Marland Kitchen oxybutynin (DITROPAN-XL) 5 MG 24 hr tablet Take 5 mg by mouth daily.     . valsartan-hydrochlorothiazide (DIOVAN-HCT) 320-25 MG per tablet Take 1 tablet by mouth daily.    Marland Kitchen warfarin (COUMADIN) 5 MG tablet Take 7.5-10 mg by mouth daily at 6 PM. Takes 10mg  on Tues and Sat Takes 7.5mg  all other days      Home: Home Living Family/patient expects to be discharged to:: Inpatient rehab  Functional History: Prior Function Level of Independence: Independent with assistive device(s) Comments: Amb with cane. Sleeps in lift chair. Functional Status:  Mobility: Bed Mobility Overal bed mobility: Needs Assistance, +2 for physical assistance Bed Mobility: Supine to Sit, Sit to Supine Supine to sit: Total assist, +2 for physical assistance (pt A with moving L LE only) Sit to supine: +2 for physical assistance, Total assist General bed mobility comments: Pt able  to A with moving L LE over to the side, but had to use pads to get pt to EOB. Transfers Overall transfer level: Needs assistance Equipment used: Rolling walker (2 wheeled) Transfers: Sit to/from Stand General transfer comment: Unable today to do with +2 A--not safe. We were then going to use the  maxi-move to get her in the recliner however no lift sling available on the unit--asked nursing secretary to order one for next session. Pt able to clear bottom on 3rd trial with extremely flexed posture.      ADL: ADL Overall ADL's : Needs assistance/impaired Eating/Feeding: Independent, Bed level Grooming: Set up, Bed level Upper Body Bathing: Moderate assistance, Sitting (or bed level due to pt's body habitus) Lower Body Bathing: Total assistance, Bed level Upper Body Dressing : Total assistance, Bed level, Sitting Lower Body Dressing: Total assistance, Bed level  Cognition: Cognition Overall Cognitive Status: Within Functional Limits for tasks assessed Orientation Level: Oriented X4 Cognition Arousal/Alertness: Awake/alert Behavior During Therapy: WFL for tasks assessed/performed Overall Cognitive Status: Within Functional Limits for tasks assessed  Blood pressure 114/49, pulse 81, temperature 97.7 F (36.5 C), temperature source Oral, resp. rate 18, height 5\' 1"  (1.549 m), weight 89.812 kg (198 lb), SpO2 91 %. Physical Exam  Vitals reviewed. Constitutional: She is oriented to person, place, and time.  HENT:  Head: Normocephalic.  Eyes: EOM are normal.  Neck: Normal range of motion. Neck supple. No thyromegaly present.  Cardiovascular: Normal rate and regular rhythm.   Respiratory: Effort normal and breath sounds normal. No respiratory distress.  GI: Soft. Bowel sounds are normal. She exhibits no distension.  Neurological: She is alert and oriented to person, place, and time.  Patient is a bit hard of hearing. Oriented 3 and follows commands  Skin:  Right hip surgical site dressed and appropriately tender  3 minus left deltoid, 4/5 right deltoid, 4/5 bilateral biceps triceps and grip 2 minus right hip flexion 3 minus knee extension for ankle dorsiflexion plantar flexion 4/5 in the left hip flexor and extensor ankle dorsi flexors and plantar flexor Sensory intact to  light touch bilateral lower extremity 1+ edema bilateral pedal  Results for orders placed or performed during the hospital encounter of 06/12/14 (from the past 24 hour(s))  Protime-INR     Status: Abnormal   Collection Time: 06/14/14  6:40 AM  Result Value Ref Range   Prothrombin Time 23.6 (H) 11.6 - 15.2 seconds   INR 2.08 (H) 0.00 - 1.49  CBC     Status: Abnormal   Collection Time: 06/14/14  6:40 AM  Result Value Ref Range   WBC 5.8 4.0 - 10.5 K/uL   RBC 3.13 (L) 3.87 - 5.11 MIL/uL   Hemoglobin 9.4 (L) 12.0 - 15.0 g/dL   HCT 27.9 (L) 36.0 - 46.0 %   MCV 89.1 78.0 - 100.0 fL   MCH 30.0 26.0 - 34.0 pg   MCHC 33.7 30.0 - 36.0 g/dL   RDW 14.1 11.5 - 15.5 %   Platelets 165 150 - 400 K/uL  Basic metabolic panel     Status: Abnormal   Collection Time: 06/14/14  6:40 AM  Result Value Ref Range   Sodium 129 (L) 137 - 147 mEq/L   Potassium 4.0 3.7 - 5.3 mEq/L   Chloride 96 96 - 112 mEq/L   CO2 24 19 - 32 mEq/L   Glucose, Bld 127 (H) 70 - 99 mg/dL   BUN 17 6 - 23 mg/dL   Creatinine, Ser  0.75 0.50 - 1.10 mg/dL   Calcium 8.1 (L) 8.4 - 10.5 mg/dL   GFR calc non Af Amer 76 (L) >90 mL/min   GFR calc Af Amer 88 (L) >90 mL/min   Anion gap 9 5 - 15   Dg Chest 1 View  06/12/2014   CLINICAL DATA:  Fall. Initial encounter. RIGHT hip pain radiating to the knee. Preoperative chest radiograph.  EXAM: CHEST - 1 VIEW  COMPARISON:  06/12/2014.  FINDINGS: Cardiomegaly. Tortuous thoracic aorta. LEFT shoulder hemiarthroplasty. RIGHT shoulder osteoarthritis incidentally noted. There is no airspace disease, pneumothorax or displaced rib fractures identified non this frontal view of the chest. Subsegmental atelectasis and/ or scarring is present in the mid chest bilaterally.  IMPRESSION: 1. Cardiomegaly without failure. 2. Scarring or subsegmental atelectasis in the lungs bilaterally.   Electronically Signed   By: Dereck Ligas M.D.   On: 06/12/2014 19:05   Dg Hip Complete Right  06/12/2014   CLINICAL  DATA:  Fall and complains of right hip pain. Pain goes down to the right knee.  EXAM: RIGHT HIP - COMPLETE 2+ VIEW  COMPARISON:  None.  FINDINGS: There is a displaced fracture of the proximal right femur. The distal fragment is medially displaced. There is at least a 1/2 shaft width of displacement. This is an oblique fracture which appears to involve the lesser trochanter and subtrochanteric region. Pelvic bony ring is intact. No gross abnormality to the left hip. Right hip is located.  IMPRESSION: Displaced fracture of the proximal right femur.   Electronically Signed   By: Markus Daft M.D.   On: 06/12/2014 19:06   Dg Femur Right  06/13/2014   CLINICAL DATA:  Femur fracture, pain, initial encounter.  EXAM: RIGHT FEMUR - 2 VIEW; DG C-ARM 61-120 MIN  COMPARISON:  06/12/2014 radiographs  FINDINGS: C-arm films document placement of an intramedullary rod with compression screw across a proximal femur fracture. Improved position and alignment.  IMPRESSION: As above.   Electronically Signed   By: Rolla Flatten M.D.   On: 06/13/2014 07:11   Ct Head Wo Contrast  06/12/2014   CLINICAL DATA:  Fall. Anticoagulated patient. Head trauma. Initial encounter.  EXAM: CT HEAD WITHOUT CONTRAST  TECHNIQUE: Contiguous axial images were obtained from the base of the skull through the vertex without intravenous contrast.  COMPARISON:  None.  FINDINGS: No mass lesion, mass effect, midline shift, hydrocephalus, hemorrhage. No acute territorial cortical ischemia/infarct. Atrophy and chronic ischemic white matter disease is present. Intracranial atherosclerosis. Paranasal sinuses are within normal limits. Bilateral lens extractions.  IMPRESSION: Atrophy and chronic ischemic white matter disease without acute intracranial abnormality.   Electronically Signed   By: Dereck Ligas M.D.   On: 06/12/2014 18:20   Dg C-arm 1-60 Min  06/13/2014   CLINICAL DATA:  Femur fracture, pain, initial encounter.  EXAM: RIGHT FEMUR - 2 VIEW; DG C-ARM  61-120 MIN  COMPARISON:  06/12/2014 radiographs  FINDINGS: C-arm films document placement of an intramedullary rod with compression screw across a proximal femur fracture. Improved position and alignment.  IMPRESSION: As above.   Electronically Signed   By: Rolla Flatten M.D.   On: 06/13/2014 07:11    Assessment/Plan: Diagnosis: Right subtrochanteric fracture after fall on 06/12/2014 status post IM nail postop day #2 1. Does the need for close, 24 hr/day medical supervision in concert with the patient's rehab needs make it unreasonable for this patient to be served in a less intensive setting? Yes 2. Co-Morbidities requiring supervision/potential complications:  History of PE,Pain control 3. Due to bladder management, bowel management, safety, skin/wound care, disease management, medication administration, pain management and patient education, does the patient require 24 hr/day rehab nursing? Yes 4. Does the patient require coordinated care of a physician, rehab nurse, PT (1-2 hrs/day, 5 days/week) and OT (1-2 hrs/day, 5 days/week) to address physical and functional deficits in the context of the above medical diagnosis(es)? Yes Addressing deficits in the following areas: balance, endurance, locomotion, strength, transferring, bowel/bladder control, bathing, dressing, feeding, grooming and toileting 5. Can the patient actively participate in an intensive therapy program of at least 3 hrs of therapy per day at least 5 days per week? Yes 6. The potential for patient to make measurable gains while on inpatient rehab is good 7. Anticipated functional outcomes upon discharge from inpatient rehab are supervision  with PT, supervision with OT, n/a with SLP. 8. Estimated rehab length of stay to reach the above functional goals is: 12-16 days 9. Does the patient have adequate social supports to accommodate these discharge functional goals? Yes 10. Anticipated D/C setting: Home 11. Anticipated post D/C  treatments: [facility] therapy 12. Overall Rehab/Functional Prognosis: good  RECOMMENDATIONS: This patient's condition is appropriate for continued rehabilitative care in the following setting: CIR Patient has agreed to participate in recommended program. Yes Note that insurance prior authorization may be required for reimbursement for recommended care.  Comment:     06/14/2014

## 2014-06-14 NOTE — Progress Notes (Signed)
Subjective: 2 Days Post-Op Procedure(s) (LRB): INTRAMEDULLARY (IM) NAIL FEMORAL (Right) Patient reports pain as mild.  She states that she has minimal pain to her R hip and has no new complaints at this time. She was able to work with PT this morning, but was unable to ambulate. She feels that she would be able to and would like to try again. She states that she does not want to go to a SNF if she can avoid it. She denies any nausea like yesterday.  She denies any new HA, CP, sOB, N, V, fever, chills, calf pain or swelling.   Objective: Vital signs in last 24 hours: Temp:  [97.7 F (36.5 C)-98.6 F (37 C)] 97.7 F (36.5 C) (11/03 0601) Pulse Rate:  [78-81] 81 (11/03 0601) Resp:  [18] 18 (11/03 0400) BP: (91-114)/(38-49) 114/49 mmHg (11/03 0601) SpO2:  [91 %-93 %] 91 % (11/03 0601)  Intake/Output from previous day: 11/02 0701 - 11/03 0700 In: 1360 [P.O.:360; IV Piggyback:1000] Out: 400 [Urine:400] Intake/Output this shift: Total I/O In: 231.3 [IV Piggyback:231.3] Out: -    Recent Labs  06/12/14 1734 06/13/14 0252 06/13/14 1344 06/14/14 0640  HGB 13.3 11.3* 9.9* 9.4*    Recent Labs  06/13/14 1344 06/14/14 0640  WBC 6.2 5.8  RBC 3.28* 3.13*  HCT 29.7* 27.9*  PLT 186 165    Recent Labs  06/13/14 1344 06/14/14 0640  NA 132* 129*  K 4.1 4.0  CL 97 96  CO2 25 24  BUN 17 17  CREATININE 0.87 0.75  GLUCOSE 144* 127*  CALCIUM 7.9* 8.1*    Recent Labs  06/13/14 0318 06/14/14 0640  INR 1.83* 2.08*    Physical Exam: WD/WN caucasian female in nad. A and O x4. Mood and affect appropriate. EOMI. Respirations normal and unlabored with [place] in place. Abdomen soft and non-tender. R hip Dressings C/D/I. NV intact with brisk capillary refill and 5/5 strength of toe extensors and flexors bilaterally. Distal sensation intact bilaterally. No lymphadenopathy. No calf pain or palpable cords.   Assessment/Plan: 2 Days Post-Op Procedure(s) (LRB): INTRAMEDULLARY (IM) NAIL  FEMORAL (Right) WBAT to RLE Continue coumadin for DVT prophylaxis PO pain medications- Rx provided  D/C per IM team when deemed appropriate  Melissa Lambert 06/14/2014, 12:24 PM  [phone]

## 2014-06-14 NOTE — Evaluation (Addendum)
Physical Therapy Evaluation Patient Details Name: Melissa Lambert MRN: [id number] DOB: 05/15/1929 Today's Date: 06/14/2014   History of Present Illness  Fall with femur fx, now s/p IM nail. PMHx:HTN, asthma, PE, protein deficiences  Clinical Impression  Pt admitted with R femur fracture, s/p IM nail. Pt currently with functional limitations due to the deficits listed below (see PT Problem List). Pt will benefit from skilled PT to increase their independence and safety with mobility to allow discharge to the venue listed below. Pt is very adamant that she does not want to go to SNF as she has had a bad experience with one.  Discussed with OT and feel that pt may be a candidate for CIR.  Will continue to assess d/c plans.       Follow Up Recommendations CIR    Equipment Recommendations  None recommended by PT    Recommendations for Other Services Rehab consult     Precautions / Restrictions Precautions Precautions: Fall Restrictions Weight Bearing Restrictions: No RLE Weight Bearing: Weight bearing as tolerated      Mobility  Bed Mobility Overal bed mobility: Needs Assistance;+2 for physical assistance Bed Mobility: Supine to Sit;Sit to Supine     Supine to sit: Total assist;+2 for physical assistance (pt A with moving L LE only) Sit to supine: +2 for physical assistance;Total assist   General bed mobility comments: Pt able to A with moving L LE over to the side, but had to use pads to get pt to EOB.  Transfers Overall transfer level: Needs assistance Equipment used: Rolling walker (2 wheeled) Transfers: Sit to/from Stand           General transfer comment: Unable today to do with +2 A--not safe. We were then going to use the maxi-move to get her in the recliner however no lift sling available on the unit--asked nursing secretary to order one for next session. Pt able to clear bottom on 3rd trial with extremely flexed posture.  Ambulation/Gait                 Stairs            Wheelchair Mobility    Modified Rankin (Stroke Patients Only)       Balance Overall balance assessment: Needs assistance Sitting-balance support: Bilateral upper extremity supported;Feet supported Sitting balance-Leahy Scale:  (fair<>poor)       Standing balance-Leahy Scale: Zero                               Pertinent Vitals/Pain Pain Assessment: Faces Faces Pain Scale: Hurts little more Pain Location: R hip Pain Descriptors / Indicators: Sore Pain Intervention(s): Monitored during session;Repositioned;Premedicated before session    Home Living Family/patient expects to be discharged to:: Inpatient rehab                      Prior Function Level of Independence: Independent with assistive device(s)         Comments: Amb with cane. Sleeps in lift chair.     Hand Dominance        Extremity/Trunk Assessment   Upper Extremity Assessment: Defer to OT evaluation           Lower Extremity Assessment: Generalized weakness;RLE deficits/detail RLE Deficits / Details: limited ROM due to pain       Communication   Communication: No difficulties  Cognition Arousal/Alertness: Awake/alert Behavior During Therapy: WFL for tasks assessed/performed  Overall Cognitive Status: Within Functional Limits for tasks assessed                      General Comments      Exercises General Exercises - Lower Extremity Ankle Circles/Pumps: AROM;Both;5 reps;Supine Quad Sets: Both;5 reps;AAROM Heel Slides: AAROM;Right;5 reps;Supine      Assessment/Plan    PT Assessment Patient needs continued PT services  PT Diagnosis Generalized weakness;Difficulty walking   PT Problem List Decreased strength;Decreased range of motion;Decreased activity tolerance;Decreased mobility;Decreased balance;Decreased knowledge of use of DME  PT Treatment Interventions DME instruction;Gait training;Functional mobility training;Therapeutic  activities;Therapeutic exercise   PT Goals (Current goals can be found in the Care Plan section) Acute Rehab PT Goals Patient Stated Goal: to go home PT Goal Formulation: With patient/family Time For Goal Achievement: 06/28/14 Potential to Achieve Goals: Good    Frequency Min 5X/week   Barriers to discharge        Co-evaluation PT/OT/SLP Co-Evaluation/Treatment: Yes Reason for Co-Treatment: For patient/therapist safety PT goals addressed during session: Mobility/safety with mobility OT goals addressed during session: ADL's and self-care       End of Session Equipment Utilized During Treatment: Gait belt Activity Tolerance: Patient limited by fatigue Patient left: in bed;with call bell/phone within reach;with family/visitor present Nurse Communication: Need for lift equipment         Time: 1030-1103 PT Time Calculation (min): 33 min   Charges:   PT Evaluation $Initial PT Evaluation Tier I: 1 Procedure PT Treatments $Therapeutic Activity: 8-22 mins   PT G Codes:          Jedrek Dinovo LUBECK 06/14/2014, 12:54 PM

## 2014-06-14 NOTE — Progress Notes (Signed)
ANTICOAGULATION CONSULT NOTE - Follow Up Consult  Pharmacy Consult for Coumadin Indication: VTE prophylaxis and hx PE and Protein C and S deficiencies  Allergies  Allergen Reactions  . Adhesive [Tape] Itching  . Penicillins Itching, Swelling and Rash    Streptomycin also  . Tuberculin Tests Itching, Swelling and Rash    Patient Measurements: Height: 5\' 1"  (154.9 cm) Weight: 198 lb (89.812 kg) IBW/kg (Calculated) : 47.8  Vital Signs: Temp: 98.7 F (37.1 C) (11/03 1536) Temp Source: Oral (11/03 1536) BP: 147/53 mmHg (11/03 1536) Pulse Rate: 83 (11/03 1536)  Labs:  Recent Labs  06/12/14 1734 06/13/14 0252 06/13/14 0318 06/13/14 1344 06/14/14 0640  HGB 13.3 11.3*  --  9.9* 9.4*  HCT 39.1 33.9*  --  29.7* 27.9*  PLT 206 171  --  186 165  LABPROT 19.9*  --  21.4*  --  23.6*  INR 1.67*  --  1.83*  --  2.08*  CREATININE 0.74 0.59  --  0.87 0.75    Estimated Creatinine Clearance: 54.3 mL/min (by C-G formula based on Cr of 0.75).  Assessment:   INR is low therapeutic today (2.08) after Coumadin resumed with 10 mg last night.  Home regimen: 7.5 mg daily, except 10 mg on Tuesdays and Saturdays. CBC has trended down POD#1 right hip IM nailing.  Goal of Therapy:  INR 2-3 Monitor platelets by anticoagulation protocol: Yes   Plan:   Coumadin 7.5 mg today.  Continue daily PT/INR.  Follow up am CBC; monitor for any bleeding.  Arty Baumgartner, [place] Pager: [phone] 06/14/2014,3:48 PM

## 2014-06-14 NOTE — Evaluation (Signed)
Occupational Therapy Evaluation Patient Details Name: Melissa Lambert MRN: [id number] DOB: November 09, 1928 Today's Date: 06/14/2014    History of Present Illness Fall with femur fx, now s/p IM nail. PMHx:HTN, asthma, PE, protein deficiences   Clinical Impression   This 80 yo female admitted and underwent above presents to acute OT with increased pain, decreased mobility, decreased balance, fear of falling, obesity all affecting her ability to care for herself at an independent level as she was pta. She will benefit from acute OT with follow up OT on CIR to get to a S level to go home with her sister's help.    Follow Up Recommendations  CIR    Equipment Recommendations   (TBD at next venue)       Precautions / Restrictions Precautions Precautions: Fall Restrictions Weight Bearing Restrictions: No RLE Weight Bearing: Weight bearing as tolerated      Mobility Bed Mobility Overal bed mobility: Needs Assistance;+2 for physical assistance Bed Mobility: Supine to Sit;Sit to Supine     Supine to sit: Total assist;+2 for physical assistance (pt only able to A [place] with moving her LLE) Sit to supine: +2 for physical assistance;Total assist      Transfers                 General transfer comment: Unable today to do with +2 A--not safe. We were then going to use the maxi-move to get her in the recliner however no lift sling available on the unit--asked nursing secretary to order one for next session    Balance Overall balance assessment: Needs assistance Sitting-balance support: Bilateral upper extremity supported;Feet supported Sitting balance-Leahy Scale:  (fair<>poor)                                      ADL Overall ADL's : Needs assistance/impaired Eating/Feeding: Independent;Bed level   Grooming: Set up;Bed level   Upper Body Bathing: Moderate assistance;Sitting (or bed level due to pt's body habitus)   Lower Body Bathing: Total assistance;Bed level    Upper Body Dressing : Total assistance;Bed level;Sitting   Lower Body Dressing: Total assistance;Bed level                                 Pertinent Vitals/Pain Pain Assessment: Faces Faces Pain Scale: Hurts little more Pain Location: right hip Pain Descriptors / Indicators: Sore Pain Intervention(s): Monitored during session;Repositioned;Premedicated before session        Extremity/Trunk Assessment Upper Extremity Assessment Upper Extremity Assessment: Generalized weakness   Lower Extremity Assessment Lower Extremity Assessment: Defer to PT evaluation          Cognition Arousal/Alertness: Awake/alert Behavior During Therapy: WFL for tasks assessed/performed Overall Cognitive Status: Within Functional Limits for tasks assessed                                Home Living Family/patient expects to be discharged to:: Inpatient rehab                                             OT Diagnosis: Generalized weakness;Acute pain   OT Problem List: Decreased strength;Decreased range of motion;Decreased activity tolerance;Impaired balance (sitting and/or standing);Pain;Obesity;Decreased  knowledge of use of DME or AE   OT Treatment/Interventions: Self-care/ADL training;Patient/family education;Balance training;Therapeutic activities;DME and/or AE instruction    OT Goals(Current goals can be found in the care plan section) Acute Rehab OT Goals Patient Stated Goal: to go home OT Goal Formulation: With patient Time For Goal Achievement: 06/28/14 Potential to Achieve Goals: Good  OT Frequency: Min 2X/week           Co-evaluation PT/OT/SLP Co-Evaluation/Treatment: Yes Reason for Co-Treatment: For patient/therapist safety   OT goals addressed during session: ADL's and self-care      End of Session Nurse Communication: Mobility status (unable to get pt up safely)  Activity Tolerance: Patient limited by fatigue Patient left: in  bed;with call bell/phone within reach;with family/visitor present   Time: 7673-4193 OT Time Calculation (min): 35 min Charges:  OT General Charges $OT Visit: 1 Procedure OT Evaluation $Initial OT Evaluation Tier I: 1 Procedure OT Treatments $Self Care/Home Management : 8-22 mins  Almon Register [phone] 06/14/2014, 12:29 PM

## 2014-06-14 NOTE — Progress Notes (Signed)
Patient was screened by Toriana Sponsel for appropriateness for an Inpatient Acute Rehab consult.  At this time, we are recommending Inpatient Rehab consult.  Please order when you feel appropriate.   Hezikiah Retzloff PT Inpatient Rehab Admissions Coordinator Cell [phone] Office [phone]  

## 2014-06-14 NOTE — Progress Notes (Addendum)
PATIENT DETAILS Name: Melissa Lambert Age: 80 y.o. Sex: female Date of Birth: [birth year] Admit Date: 06/12/2014 Admitting Physician Etta Quill, DO JIR:CVEL,FYBOFB, MD  Subjective: Still "sore", but otherwise no major complaints.  Assessment/Plan: Principal Problem:   Femur fracture, right:s/p Intramedullary nailing on 06/13/14. On coumadin chronically.Suspect will need SNF, however patient/family prefer HHPT. PT eval pending   Active Problems:  Transient Hypotension:secondary to hypovolemia/anesthesia effects, occured post operatively on 11/2. Responded to IVF, all antihypertensives continue to be on hold. Follow   Hyponatremia:secondary to HCTZ,dehydration. Continue to hold and follow lytes.   Anemia:secondary to perioperative blood loss, IV fluid dilution. No indication for PRBC transfusion. Monitor hemoglobin.   HTN:All anti-hypertensive's on hold, due to above.   GERD:stable, continue PPI     History of pulmonary embolus (PE):coumadin per pharmacy, INR 2.08  Disposition: Remain inpatient-await PT eval-SNF vs HHPT  Antibiotics:  IV Clindamycin x 1 06/13/14  DVT Prophylaxis: On coumadin  Code Status: Full code   Family Communication Sister at bedside  Procedures: 06/13/14-Open treatment of right subtrochanteric femur fracture withintramedullary nailing  CONSULTS:  orthopedic surgery  Time spent 40 minutes-which includes 50% of the time with face-to-face with patient/ family and coordinating care related to the above assessment and plan.   MEDICATIONS: Scheduled Meds: . calcium-vitamin D  1 tablet Oral BID  . docusate sodium  100 mg Oral BID  . loratadine  10 mg Oral Daily  . oxybutynin  5 mg Oral Daily  . pantoprazole  40 mg Oral Daily  . senna  2 tablet Oral BID  . Warfarin - Pharmacist Dosing Inpatient   Does not apply q1800   Continuous Infusions:   PRN Meds:.acetaminophen **OR** acetaminophen, albuterol, HYDROcodone-acetaminophen,  menthol-cetylpyridinium **OR** phenol, metoCLOPramide **OR** metoCLOPramide (REGLAN) injection, morphine injection, ondansetron **OR** ondansetron (ZOFRAN) IV, sodium chloride  Antibiotics: Anti-infectives    Start     Dose/Rate Route Frequency Ordered Stop   06/13/14 0010  clindamycin (CLEOCIN) 900 MG/50ML IVPB    Comments:  Ubaldo Glassing   : cabinet override      06/13/14 0010 06/13/14 0012       PHYSICAL EXAM: Vital signs in last 24 hours: Filed Vitals:   06/14/14 0003 06/14/14 0142 06/14/14 0400 06/14/14 0601  BP: 91/46 103/38  114/49  Pulse:  78  81  Temp:  98.4 F (36.9 C)  97.7 F (36.5 C)  TempSrc:  Oral    Resp:   18   Height:      Weight:      SpO2: 92% 92% 92% 91%    Weight change:  Filed Weights   06/12/14 1715  Weight: 89.812 kg (198 lb)   Body mass index is 37.43 kg/(m^2).   Gen Exam: Awake and alert with clear speech.   Neck: Supple, No JVD.   Chest: B/L Clear-except for a few scattered rhonchi. CVS: S1 S2 Regular, no murmurs.  Abdomen: soft, BS +, non tender, non distended.  Extremities: no edema, lower extremities warm to touch. Neurologic: Non Focal.   Skin: No Rash.   Wounds: N/A.   Intake/Output from previous day:  Intake/Output Summary (Last 24 hours) at 06/14/14 1215 Last data filed at 06/14/14 1005  Gross per 24 hour  Intake 1351.25 ml  Output    400 ml  Net 951.25 ml     LAB RESULTS: CBC  Recent Labs Lab 06/12/14 1734 06/13/14 0252 06/13/14 1344 06/14/14 0640  WBC 5.2 8.1 6.2 5.8  HGB 13.3 11.3* 9.9* 9.4*  HCT 39.1 33.9* 29.7* 27.9*  PLT 206 171 186 165  MCV 88.5 89.2 90.5 89.1  MCH 30.1 29.7 30.2 30.0  MCHC 34.0 33.3 33.3 33.7  RDW 13.7 13.7 13.9 14.1  LYMPHSABS 1.6  --   --   --   MONOABS 0.4  --   --   --   EOSABS 0.1  --   --   --   BASOSABS 0.0  --   --   --     Chemistries   Recent Labs Lab 06/12/14 1734 06/13/14 0252 06/13/14 1344 06/14/14 0640  NA 132* 126* 132* 129*  K 4.3 3.6* 4.1 4.0  CL 94*  92* 97 96  CO2 28 24 25 24   GLUCOSE 166* 178* 144* 127*  BUN 16 13 17 17   CREATININE 0.74 0.59 0.87 0.75  CALCIUM 9.1 8.3* 7.9* 8.1*    CBG: No results for input(s): GLUCAP in the last 168 hours.  GFR Estimated Creatinine Clearance: 54.3 mL/min (by C-G formula based on Cr of 0.75).  Coagulation profile  Recent Labs Lab 06/12/14 1734 06/13/14 0318 06/14/14 0640  INR 1.67* 1.83* 2.08*    Cardiac Enzymes No results for input(s): CKMB, TROPONINI, MYOGLOBIN in the last 168 hours.  Invalid input(s): CK  Invalid input(s): POCBNP No results for input(s): DDIMER in the last 72 hours. No results for input(s): HGBA1C in the last 72 hours. No results for input(s): CHOL, HDL, LDLCALC, TRIG, CHOLHDL, LDLDIRECT in the last 72 hours. No results for input(s): TSH, T4TOTAL, T3FREE, THYROIDAB in the last 72 hours.  Invalid input(s): FREET3 No results for input(s): VITAMINB12, FOLATE, FERRITIN, TIBC, IRON, RETICCTPCT in the last 72 hours. No results for input(s): LIPASE, AMYLASE in the last 72 hours.  Urine Studies No results for input(s): UHGB, CRYS in the last 72 hours.  Invalid input(s): UACOL, UAPR, USPG, UPH, UTP, UGL, UKET, UBIL, UNIT, UROB, ULEU, UEPI, UWBC, URBC, UBAC, CAST, UCOM, BILUA  MICROBIOLOGY: No results found for this or any previous visit (from the past 240 hour(s)).  RADIOLOGY STUDIES/RESULTS: Dg Chest 1 View  06/12/2014   CLINICAL DATA:  Fall. Initial encounter. RIGHT hip pain radiating to the knee. Preoperative chest radiograph.  EXAM: CHEST - 1 VIEW  COMPARISON:  06/12/2014.  FINDINGS: Cardiomegaly. Tortuous thoracic aorta. LEFT shoulder hemiarthroplasty. RIGHT shoulder osteoarthritis incidentally noted. There is no airspace disease, pneumothorax or displaced rib fractures identified non this frontal view of the chest. Subsegmental atelectasis and/ or scarring is present in the mid chest bilaterally.  IMPRESSION: 1. Cardiomegaly without failure. 2. Scarring or  subsegmental atelectasis in the lungs bilaterally.   Electronically Signed   By: Dereck Ligas M.D.   On: 06/12/2014 19:05   Dg Hip Complete Right  06/12/2014   CLINICAL DATA:  Fall and complains of right hip pain. Pain goes down to the right knee.  EXAM: RIGHT HIP - COMPLETE 2+ VIEW  COMPARISON:  None.  FINDINGS: There is a displaced fracture of the proximal right femur. The distal fragment is medially displaced. There is at least a 1/2 shaft width of displacement. This is an oblique fracture which appears to involve the lesser trochanter and subtrochanteric region. Pelvic bony ring is intact. No gross abnormality to the left hip. Right hip is located.  IMPRESSION: Displaced fracture of the proximal right femur.   Electronically Signed   By: Markus Daft M.D.   On: 06/12/2014 19:06   Dg Femur Right  06/13/2014  CLINICAL DATA:  Femur fracture, pain, initial encounter.  EXAM: RIGHT FEMUR - 2 VIEW; DG C-ARM 61-120 MIN  COMPARISON:  06/12/2014 radiographs  FINDINGS: C-arm films document placement of an intramedullary rod with compression screw across a proximal femur fracture. Improved position and alignment.  IMPRESSION: As above.   Electronically Signed   By: Rolla Flatten M.D.   On: 06/13/2014 07:11   Ct Head Wo Contrast  06/12/2014   CLINICAL DATA:  Fall. Anticoagulated patient. Head trauma. Initial encounter.  EXAM: CT HEAD WITHOUT CONTRAST  TECHNIQUE: Contiguous axial images were obtained from the base of the skull through the vertex without intravenous contrast.  COMPARISON:  None.  FINDINGS: No mass lesion, mass effect, midline shift, hydrocephalus, hemorrhage. No acute territorial cortical ischemia/infarct. Atrophy and chronic ischemic white matter disease is present. Intracranial atherosclerosis. Paranasal sinuses are within normal limits. Bilateral lens extractions.  IMPRESSION: Atrophy and chronic ischemic white matter disease without acute intracranial abnormality.   Electronically Signed   By:  Dereck Ligas M.D.   On: 06/12/2014 18:20   Dg C-arm 1-60 Min  06/13/2014   CLINICAL DATA:  Femur fracture, pain, initial encounter.  EXAM: RIGHT FEMUR - 2 VIEW; DG C-ARM 61-120 MIN  COMPARISON:  06/12/2014 radiographs  FINDINGS: C-arm films document placement of an intramedullary rod with compression screw across a proximal femur fracture. Improved position and alignment.  IMPRESSION: As above.   Electronically Signed   By: Rolla Flatten M.D.   On: 06/13/2014 07:11    Oren Binet, MD  [facility] Pager:[phone]  If 7PM-7AM, please contact night-coverage [email or web address] Password [email or web address] 06/14/2014, 12:15 PM   LOS: 2 days

## 2014-06-15 LAB — CBC
HCT: 27 % — ABNORMAL LOW (ref 36.0–46.0)
Hemoglobin: 9.1 g/dL — ABNORMAL LOW (ref 12.0–15.0)
MCH: 30 pg (ref 26.0–34.0)
MCHC: 33.7 g/dL (ref 30.0–36.0)
MCV: 89.1 fL (ref 78.0–100.0)
PLATELETS: 168 10*3/uL (ref 150–400)
RBC: 3.03 MIL/uL — ABNORMAL LOW (ref 3.87–5.11)
RDW: 13.9 % (ref 11.5–15.5)
WBC: 6.5 10*3/uL (ref 4.0–10.5)

## 2014-06-15 LAB — BASIC METABOLIC PANEL
ANION GAP: 7 (ref 5–15)
BUN: 12 mg/dL (ref 6–23)
CALCIUM: 8.2 mg/dL — AB (ref 8.4–10.5)
CO2: 26 mEq/L (ref 19–32)
Chloride: 98 mEq/L (ref 96–112)
Creatinine, Ser: 0.64 mg/dL (ref 0.50–1.10)
GFR calc Af Amer: >90 mL/min (ref >90)
GFR, EST NON AFRICAN AMERICAN: 80 mL/min — AB (ref >90)
Glucose, Bld: 122 mg/dL — ABNORMAL HIGH (ref 70–99)
Potassium: 4.4 mEq/L (ref 3.7–5.3)
Sodium: 131 mEq/L — ABNORMAL LOW (ref 137–147)

## 2014-06-15 LAB — PROTIME-INR
INR: 2.56 — ABNORMAL HIGH (ref 0.00–1.49)
PROTHROMBIN TIME: 27.8 s — AB (ref 11.6–15.2)

## 2014-06-15 MED ORDER — WARFARIN SODIUM 5 MG PO TABS
5.0000 mg | ORAL_TABLET | Freq: Once | ORAL | Status: AC
  Administered 2014-06-15: 5 mg via ORAL
  Filled 2014-06-15: qty 1

## 2014-06-15 MED ORDER — FUROSEMIDE 10 MG/ML IJ SOLN
20.0000 mg | Freq: Once | INTRAMUSCULAR | Status: AC
  Administered 2014-06-15: 20 mg via INTRAVENOUS

## 2014-06-15 MED ORDER — FUROSEMIDE 10 MG/ML IJ SOLN
INTRAMUSCULAR | Status: AC
  Administered 2014-06-15: 08:00:00
  Filled 2014-06-15: qty 2

## 2014-06-15 MED ORDER — SENNA 8.6 MG PO TABS: 2.0000 | ORAL_TABLET | Freq: Two times a day (BID) | ORAL | Status: AC

## 2014-06-15 NOTE — Discharge Summary (Addendum)
PATIENT DETAILS Name: Melissa Lambert Age: 80 y.o. Sex: female Date of Birth: 1928/11/14 MRN: [id number]. Admitting Physician: Carron Curie ZJI:RCVE,LFYBOF, MD  Admit Date: 06/12/2014 Discharge date: 06/16/2014  Recommendations for Outpatient Follow-up:  1. On coumadin-please have pharmacy dose while at CIR and check INR periodically 2. Repeat Lytes/CBC periodically 3. Needs follow up with Ortho-Dr Doran Durand in 2 weeks. 4. Resume antihypertensive medications when able.  PRIMARY DISCHARGE DIAGNOSIS:  Principal Problem:   Femur fracture, right Active Problems:   History of pulmonary embolus (PE)      PAST MEDICAL HISTORY: Past Medical History  Diagnosis Date  . Hypertension   . Asthma   . PE (pulmonary embolism)   . Protein C deficiency   . Protein S deficiency     DISCHARGE MEDICATIONS: Current Discharge Medication List    START taking these medications   Details  senna (SENOKOT) 8.6 MG TABS tablet Take 2 tablets (17.2 mg total) by mouth 2 (two) times daily. Qty: 120 each, Refills: 0      CONTINUE these medications which have NOT CHANGED   Details  Calcium Carbonate-Vit D-Min (CALTRATE 600+D PLUS MINERALS) 600-800 MG-UNIT TABS Take 1 tablet by mouth 2 (two) times daily.    cetirizine (ZYRTEC) 10 MG tablet Take 10 mg by mouth daily.    omeprazole (PRILOSEC OTC) 20 MG tablet Take 20 mg by mouth daily.    oxybutynin (DITROPAN-XL) 5 MG 24 hr tablet Take 5 mg by mouth daily.     warfarin (COUMADIN) 5 MG tablet Take 7.5-10 mg by mouth daily at 6 PM. Takes 10mg  on Tues and Sat Takes 7.5mg  all other days      STOP taking these medications     amLODipine (NORVASC) 2.5 MG tablet      docusate sodium (COLACE) 100 MG capsule      valsartan-hydrochlorothiazide (DIOVAN-HCT) 320-25 MG per tablet         ALLERGIES:   Allergies  Allergen Reactions  . Adhesive [Tape] Itching  . Penicillins Itching, Swelling and Rash    Streptomycin also  . Tuberculin Tests  Itching, Swelling and Rash    BRIEF HPI:  See H&P, Labs, Consult and Test reports for all details in brief, patient  is a 80 y.o. female who suffered a mechanical fall at home .Brought in to ED by EMS, found to have R proximal femur fracture.  CONSULTATIONS:   orthopedic surgery  PERTINENT RADIOLOGIC STUDIES: Dg Chest 1 View  06/12/2014   CLINICAL DATA:  Fall. Initial encounter. RIGHT hip pain radiating to the knee. Preoperative chest radiograph.  EXAM: CHEST - 1 VIEW  COMPARISON:  06/12/2014.  FINDINGS: Cardiomegaly. Tortuous thoracic aorta. LEFT shoulder hemiarthroplasty. RIGHT shoulder osteoarthritis incidentally noted. There is no airspace disease, pneumothorax or displaced rib fractures identified non this frontal view of the chest. Subsegmental atelectasis and/ or scarring is present in the mid chest bilaterally.  IMPRESSION: 1. Cardiomegaly without failure. 2. Scarring or subsegmental atelectasis in the lungs bilaterally.   Electronically Signed   By: Dereck Ligas M.D.   On: 06/12/2014 19:05   Dg Hip Complete Right  06/12/2014   CLINICAL DATA:  Fall and complains of right hip pain. Pain goes down to the right knee.  EXAM: RIGHT HIP - COMPLETE 2+ VIEW  COMPARISON:  None.  FINDINGS: There is a displaced fracture of the proximal right femur. The distal fragment is medially displaced. There is at least a 1/2 shaft width of displacement. This is an oblique  fracture which appears to involve the lesser trochanter and subtrochanteric region. Pelvic bony ring is intact. No gross abnormality to the left hip. Right hip is located.  IMPRESSION: Displaced fracture of the proximal right femur.   Electronically Signed   By: Markus Daft M.D.   On: 06/12/2014 19:06   Dg Femur Right  06/13/2014   CLINICAL DATA:  Femur fracture, pain, initial encounter.  EXAM: RIGHT FEMUR - 2 VIEW; DG C-ARM 61-120 MIN  COMPARISON:  06/12/2014 radiographs  FINDINGS: C-arm films document placement of an intramedullary rod with  compression screw across a proximal femur fracture. Improved position and alignment.  IMPRESSION: As above.   Electronically Signed   By: Rolla Flatten M.D.   On: 06/13/2014 07:11   Ct Head Wo Contrast  06/12/2014   CLINICAL DATA:  Fall. Anticoagulated patient. Head trauma. Initial encounter.  EXAM: CT HEAD WITHOUT CONTRAST  TECHNIQUE: Contiguous axial images were obtained from the base of the skull through the vertex without intravenous contrast.  COMPARISON:  None.  FINDINGS: No mass lesion, mass effect, midline shift, hydrocephalus, hemorrhage. No acute territorial cortical ischemia/infarct. Atrophy and chronic ischemic white matter disease is present. Intracranial atherosclerosis. Paranasal sinuses are within normal limits. Bilateral lens extractions.  IMPRESSION: Atrophy and chronic ischemic white matter disease without acute intracranial abnormality.   Electronically Signed   By: Dereck Ligas M.D.   On: 06/12/2014 18:20   Dg C-arm 1-60 Min  06/13/2014   CLINICAL DATA:  Femur fracture, pain, initial encounter.  EXAM: RIGHT FEMUR - 2 VIEW; DG C-ARM 61-120 MIN  COMPARISON:  06/12/2014 radiographs  FINDINGS: C-arm films document placement of an intramedullary rod with compression screw across a proximal femur fracture. Improved position and alignment.  IMPRESSION: As above.   Electronically Signed   By: Rolla Flatten M.D.   On: 06/13/2014 07:11     PERTINENT LAB RESULTS: CBC:  Recent Labs  06/14/14 0640 06/15/14 0613  WBC 5.8 6.5  HGB 9.4* 9.1*  HCT 27.9* 27.0*  PLT 165 168   CMET CMP     Component Value Date/Time   NA 131* 06/15/2014 0613   K 4.4 06/15/2014 0613   CL 98 06/15/2014 0613   CO2 26 06/15/2014 0613   GLUCOSE 122* 06/15/2014 0613   BUN 12 06/15/2014 0613   CREATININE 0.64 06/15/2014 0613   CALCIUM 8.2* 06/15/2014 0613   PROT 7.2 06/12/2014 1734   ALBUMIN 3.5 06/12/2014 1734   AST 26 06/12/2014 1734   ALT 12 06/12/2014 1734   ALKPHOS 63 06/12/2014 1734   BILITOT  0.4 06/12/2014 1734   GFRNONAA 80* 06/15/2014 0613   GFRAA >90 06/15/2014 0613    GFR Estimated Creatinine Clearance: 54.3 mL/min (by C-G formula based on Cr of 0.64). No results for input(s): LIPASE, AMYLASE in the last 72 hours. No results for input(s): CKTOTAL, CKMB, CKMBINDEX, TROPONINI in the last 72 hours. Invalid input(s): POCBNP No results for input(s): DDIMER in the last 72 hours. No results for input(s): HGBA1C in the last 72 hours. No results for input(s): CHOL, HDL, LDLCALC, TRIG, CHOLHDL, LDLDIRECT in the last 72 hours. No results for input(s): TSH, T4TOTAL, T3FREE, THYROIDAB in the last 72 hours.  Invalid input(s): FREET3 No results for input(s): VITAMINB12, FOLATE, FERRITIN, TIBC, IRON, RETICCTPCT in the last 72 hours. Coags:  Recent Labs  06/15/14 0613 06/16/14 0433  INR 2.56* 2.53*   Microbiology: No results found for this or any previous visit (from the past 240 hour(s)).  BRIEF HOSPITAL COURSE:  Femur fracture, right:s/p Intramedullary nailing on 06/13/14. On coumadin chronically.Suspect will need SNF, however patient/family prefer HHPT.Seen by inpatient rehab, recommendations for to transfer to CIR when bed available. Will need follow up with Dr Doran Durand in 2 weeks.  Active Problems: Transient Hypotension:secondary to hypovolemia/anesthesia effects, occured post operatively on 11/2. Responded to IVF, all antihypertensives continue to be on hold.BP stable, continue to hold antihypertensives on discharge, and resume when able.   Hyponatremia:secondary to HCTZ,dehydration. Continue to holdn HCTZ and follow lytes periodically   Anemia:secondary to perioperative blood loss, IV fluid dilution. No indication for PRBC transfusion. Monitor hemoglobin periodically.   HTN:All anti-hypertensive's on hold, due to above.Resume when able, BP controlled without any antihypertensives   GERD:stable, continue PPI    History of pulmonary embolus (PE):coumadin per  pharmacy, INR 2.56 on day of discharge. Continue coumadin while at CIR and monitor INR periodically   TODAY-DAY OF DISCHARGE:  Subjective:   Chyane Linsey today has no headache,no chest abdominal pain,no new weakness tingling or numbness  Objective:   Blood pressure 147/64, pulse 79, temperature 98.7 F (37.1 C), temperature source Oral, resp. rate 17, height 5\' 1"  (1.549 m), weight 89.812 kg (198 lb), SpO2 93 %.  Intake/Output Summary (Last 24 hours) at 06/16/14 1126 Last data filed at 06/16/14 0900  Gross per 24 hour  Intake    720 ml  Output      3 ml  Net    717 ml   Filed Weights   06/12/14 1715  Weight: 89.812 kg (198 lb)    Exam Awake Alert, Oriented *3, No new F.N deficits, Normal affect [place].AT,PERRAL Supple Neck,No JVD, No cervical lymphadenopathy appriciated.  Symmetrical Chest wall movement, Good air movement bilaterally, CTAB RRR,No Gallops,Rubs or new Murmurs, No Parasternal Heave +ve B.Sounds, Abd Soft, Non tender, No organomegaly appriciated, No rebound -guarding or rigidity. No Cyanosis, Clubbing or edema, No new Rash or bruise  DISCHARGE CONDITION: Stable  DISPOSITION: CIR  DISCHARGE INSTRUCTIONS:    Activity:  As tolerated with Full fall precautions use walker/cane & assistance as needed  Diet recommendation: Heart Healthy diet  Discharge Instructions    Diet - low sodium heart healthy    Complete by:  As directed      Increase activity slowly    Complete by:  As directed            Follow-up Information    Follow up with Wylene Simmer, MD In 2 weeks.   Specialty:  Orthopedic Surgery   Why:  For wound re-check   Contact information:   [address] [place] [address] [phone]       Follow up with [facility], MD. Schedule an appointment as soon as possible for a visit in 1 week.   Specialty:  Internal Medicine   Why:  after discharge from [place] information:   [address]  [place]  [address] [phone]       Total Time spent on discharge equals 45 minutes.  SignedOren Binet 06/16/2014 11:26 AM

## 2014-06-15 NOTE — Progress Notes (Signed)
PATIENT DETAILS Name: Melissa Lambert Age: 80 y.o. Sex: female Date of Birth: 21-Apr-1929 Admit Date: 06/12/2014 Admitting Physician Etta Quill, DO VVO:HYWV,PXTGGY, MD  Subjective: No major events overnight.  Assessment/Plan: Principal Problem:   Femur fracture, right:s/p Intramedullary nailing on 06/13/14. On coumadin chronically.Suspect will need SNF, however patient/family prefer HHPT.CIR eval pending, if not a CIR candidate, will send home with HHPT as desired by patient.  Active Problems:  Transient Hypotension:secondary to hypovolemia/anesthesia effects, occured post operatively on 11/2. Responded to IVF, all antihypertensives continue to be on hold.BP stable, continue to hold antihypertensives   Hyponatremia:secondary to HCTZ,dehydration. Continue to hold and follow lytes.   Anemia:secondary to perioperative blood loss, IV fluid dilution. No indication for PRBC transfusion. Monitor hemoglobin.   HTN:All anti-hypertensive's on hold, due to above.   GERD:stable, continue PPI     History of pulmonary embolus (PE):coumadin per pharmacy, INR 2.56  Disposition: Remain inpatient-CIR vs HHPT  Antibiotics:  IV Clindamycin x 1 06/13/14  DVT Prophylaxis: On coumadin  Code Status: Full code   Family Communication Sister at bedside  Procedures: 06/13/14-Open treatment of right subtrochanteric femur fracture withintramedullary nailing  CONSULTS:  orthopedic surgery   MEDICATIONS: Scheduled Meds: . calcium-vitamin D  1 tablet Oral BID  . docusate sodium  100 mg Oral BID  . furosemide      . loratadine  10 mg Oral Daily  . oxybutynin  5 mg Oral Daily  . pantoprazole  40 mg Oral Daily  . senna  2 tablet Oral BID  . Warfarin - Pharmacist Dosing Inpatient   Does not apply q1800   Continuous Infusions:   PRN Meds:.acetaminophen **OR** acetaminophen, albuterol, HYDROcodone-acetaminophen, menthol-cetylpyridinium **OR** phenol, metoCLOPramide **OR** metoCLOPramide  (REGLAN) injection, morphine injection, ondansetron **OR** ondansetron (ZOFRAN) IV, sodium chloride  Antibiotics: Anti-infectives    Start     Dose/Rate Route Frequency Ordered Stop   06/13/14 0010  clindamycin (CLEOCIN) 900 MG/50ML IVPB    Comments:  Ubaldo Glassing   : cabinet override      06/13/14 0010 06/13/14 0012       PHYSICAL EXAM: Vital signs in last 24 hours: Filed Vitals:   06/14/14 0601 06/14/14 1536 06/14/14 1952 06/15/14 0536  BP: 114/49 147/53 149/55 95/37  Pulse: 81 83 82 69  Temp: 97.7 F (36.5 C) 98.7 F (37.1 C) 99.1 F (37.3 C) 98 F (36.7 C)  TempSrc:  Oral Oral Axillary  Resp:  16 17 18   Height:      Weight:      SpO2: 91% 95% 95% 94%    Weight change:  Filed Weights   06/12/14 1715  Weight: 89.812 kg (198 lb)   Body mass index is 37.43 kg/(m^2).   Gen Exam: Awake and alert with clear speech.   Neck: Supple, No JVD.   Chest: B/L Clear-except for a few scattered rhonchi. CVS: S1 S2 Regular, no murmurs.  Abdomen: soft, BS +, non tender, non distended.  Extremities: no edema, lower extremities warm to touch. Neurologic: Non Focal.   Skin: No Rash.   Wounds: N/A.   Intake/Output from previous day:  Intake/Output Summary (Last 24 hours) at 06/15/14 0924 Last data filed at 06/15/14 0900  Gross per 24 hour  Intake 591.25 ml  Output      2 ml  Net 589.25 ml     LAB RESULTS: CBC  Recent Labs Lab 06/12/14 1734 06/13/14 0252 06/13/14 1344 06/14/14 0640 06/15/14 0613  WBC 5.2  8.1 6.2 5.8 6.5  HGB 13.3 11.3* 9.9* 9.4* 9.1*  HCT 39.1 33.9* 29.7* 27.9* 27.0*  PLT 206 171 186 165 168  MCV 88.5 89.2 90.5 89.1 89.1  MCH 30.1 29.7 30.2 30.0 30.0  MCHC 34.0 33.3 33.3 33.7 33.7  RDW 13.7 13.7 13.9 14.1 13.9  LYMPHSABS 1.6  --   --   --   --   MONOABS 0.4  --   --   --   --   EOSABS 0.1  --   --   --   --   BASOSABS 0.0  --   --   --   --     Chemistries   Recent Labs Lab 06/12/14 1734 06/13/14 0252 06/13/14 1344 06/14/14 0640  06/15/14 0613  NA 132* 126* 132* 129* 131*  K 4.3 3.6* 4.1 4.0 4.4  CL 94* 92* 97 96 98  CO2 28 24 25 24 26   GLUCOSE 166* 178* 144* 127* 122*  BUN 16 13 17 17 12   CREATININE 0.74 0.59 0.87 0.75 0.64  CALCIUM 9.1 8.3* 7.9* 8.1* 8.2*    CBG: No results for input(s): GLUCAP in the last 168 hours.  GFR Estimated Creatinine Clearance: 54.3 mL/min (by C-G formula based on Cr of 0.64).  Coagulation profile  Recent Labs Lab 06/12/14 1734 06/13/14 0318 06/14/14 0640 06/15/14 0613  INR 1.67* 1.83* 2.08* 2.56*    Cardiac Enzymes No results for input(s): CKMB, TROPONINI, MYOGLOBIN in the last 168 hours.  Invalid input(s): CK  Invalid input(s): POCBNP No results for input(s): DDIMER in the last 72 hours. No results for input(s): HGBA1C in the last 72 hours. No results for input(s): CHOL, HDL, LDLCALC, TRIG, CHOLHDL, LDLDIRECT in the last 72 hours. No results for input(s): TSH, T4TOTAL, T3FREE, THYROIDAB in the last 72 hours.  Invalid input(s): FREET3 No results for input(s): VITAMINB12, FOLATE, FERRITIN, TIBC, IRON, RETICCTPCT in the last 72 hours. No results for input(s): LIPASE, AMYLASE in the last 72 hours.  Urine Studies No results for input(s): UHGB, CRYS in the last 72 hours.  Invalid input(s): UACOL, UAPR, USPG, UPH, UTP, UGL, UKET, UBIL, UNIT, UROB, ULEU, UEPI, UWBC, URBC, UBAC, CAST, UCOM, BILUA  MICROBIOLOGY: No results found for this or any previous visit (from the past 240 hour(s)).  RADIOLOGY STUDIES/RESULTS: Dg Chest 1 View  06/12/2014   CLINICAL DATA:  Fall. Initial encounter. RIGHT hip pain radiating to the knee. Preoperative chest radiograph.  EXAM: CHEST - 1 VIEW  COMPARISON:  06/12/2014.  FINDINGS: Cardiomegaly. Tortuous thoracic aorta. LEFT shoulder hemiarthroplasty. RIGHT shoulder osteoarthritis incidentally noted. There is no airspace disease, pneumothorax or displaced rib fractures identified non this frontal view of the chest. Subsegmental atelectasis  and/ or scarring is present in the mid chest bilaterally.  IMPRESSION: 1. Cardiomegaly without failure. 2. Scarring or subsegmental atelectasis in the lungs bilaterally.   Electronically Signed   By: Dereck Ligas M.D.   On: 06/12/2014 19:05   Dg Hip Complete Right  06/12/2014   CLINICAL DATA:  Fall and complains of right hip pain. Pain goes down to the right knee.  EXAM: RIGHT HIP - COMPLETE 2+ VIEW  COMPARISON:  None.  FINDINGS: There is a displaced fracture of the proximal right femur. The distal fragment is medially displaced. There is at least a 1/2 shaft width of displacement. This is an oblique fracture which appears to involve the lesser trochanter and subtrochanteric region. Pelvic bony ring is intact. No gross abnormality to the left hip.  Right hip is located.  IMPRESSION: Displaced fracture of the proximal right femur.   Electronically Signed   By: Markus Daft M.D.   On: 06/12/2014 19:06   Dg Femur Right  06/13/2014   CLINICAL DATA:  Femur fracture, pain, initial encounter.  EXAM: RIGHT FEMUR - 2 VIEW; DG C-ARM 61-120 MIN  COMPARISON:  06/12/2014 radiographs  FINDINGS: C-arm films document placement of an intramedullary rod with compression screw across a proximal femur fracture. Improved position and alignment.  IMPRESSION: As above.   Electronically Signed   By: Rolla Flatten M.D.   On: 06/13/2014 07:11   Ct Head Wo Contrast  06/12/2014   CLINICAL DATA:  Fall. Anticoagulated patient. Head trauma. Initial encounter.  EXAM: CT HEAD WITHOUT CONTRAST  TECHNIQUE: Contiguous axial images were obtained from the base of the skull through the vertex without intravenous contrast.  COMPARISON:  None.  FINDINGS: No mass lesion, mass effect, midline shift, hydrocephalus, hemorrhage. No acute territorial cortical ischemia/infarct. Atrophy and chronic ischemic white matter disease is present. Intracranial atherosclerosis. Paranasal sinuses are within normal limits. Bilateral lens extractions.  IMPRESSION:  Atrophy and chronic ischemic white matter disease without acute intracranial abnormality.   Electronically Signed   By: Dereck Ligas M.D.   On: 06/12/2014 18:20   Dg C-arm 1-60 Min  06/13/2014   CLINICAL DATA:  Femur fracture, pain, initial encounter.  EXAM: RIGHT FEMUR - 2 VIEW; DG C-ARM 61-120 MIN  COMPARISON:  06/12/2014 radiographs  FINDINGS: C-arm films document placement of an intramedullary rod with compression screw across a proximal femur fracture. Improved position and alignment.  IMPRESSION: As above.   Electronically Signed   By: Rolla Flatten M.D.   On: 06/13/2014 07:11    Oren Binet, MD  [facility] Pager:[phone]  If 7PM-7AM, please contact night-coverage [email or web address] Password [email or web address] 06/15/2014, 9:24 AM   LOS: 3 days

## 2014-06-15 NOTE — Progress Notes (Signed)
Rehab admissions - I was not able to get a bed on inpatient rehab today.  I will follow up again in am for bed availability.  We have limited beds on rehab for the next couple days.  Call me for questions.  #[phone]

## 2014-06-15 NOTE — Progress Notes (Signed)
Rehab admissions - Evaluated for possible admission.  I met with patient.  She would like to admit to inpatient rehab.  She does not want to go to a SNF.  I will check for bed availability for tomorrow and notify MD if I can get a bed today.  Call me for questions.  #[phone]

## 2014-06-15 NOTE — Progress Notes (Signed)
Subjective: 3 Days Post-Op Procedure(s) (LRB): INTRAMEDULLARY (IM) NAIL FEMORAL (Right) Patient reports pain as mild.  She has no new complaints this morning. She denies any new HA, CP, SOB, N, V, fever, chills, changes in appetite. She was up with PT yesterday, but did not ambulate. She understands that a rehab consult for inpatient rehab is pending; she would like to do inpatient rehab with the goal of returning home vs going to a SNF if possible.   Objective: Vital signs in last 24 hours: Temp:  [98 F (36.7 C)-99.1 F (37.3 C)] 98 F (36.7 C) (11/04 0536) Pulse Rate:  [69-83] 69 (11/04 0536) Resp:  [16-18] 18 (11/04 0536) BP: (95-149)/(37-55) 95/37 mmHg (11/04 0536) SpO2:  [94 %-95 %] 94 % (11/04 0536)  Intake/Output from previous day: 11/03 0701 - 11/04 0700 In: 591.3 [P.O.:360; IV Piggyback:231.3] Out: -  Intake/Output this shift:     Recent Labs  06/12/14 1734 06/13/14 0252 06/13/14 1344 06/14/14 0640 06/15/14 0613  HGB 13.3 11.3* 9.9* 9.4* 9.1*    Recent Labs  06/14/14 0640 06/15/14 0613  WBC 5.8 6.5  RBC 3.13* 3.03*  HCT 27.9* 27.0*  PLT 165 168    Recent Labs  06/14/14 0640 06/15/14 0613  NA 129* 131*  K 4.0 4.4  CL 96 98  CO2 24 26  BUN 17 12  CREATININE 0.75 0.64  GLUCOSE 127* 122*  CALCIUM 8.1* 8.2*    Recent Labs  06/14/14 0640 06/15/14 0613  INR 2.08* 2.56*    Physical Exam: WD/WN caucasian female in nad. A and O x4. Mood and affect appropriate. EOMI. Respirations normal and unlabored with [place] in place. Abdomen soft and non-tender. R hip Dressings C/D/I. NV intact with brisk capillary refill and 5/5 strength of toe extensors and flexors bilaterally. Distal sensation intact bilaterally. No lymphadenopathy. No calf pain or palpable cords.   Assessment/Plan: 3 Days Post-Op Procedure(s) (LRB): INTRAMEDULLARY (IM) NAIL FEMORAL (Right) Up with therapy, WBAT on RLE Coumadin for DVT prophylaxis PO pain medications- Rx in chart D/C plans  per IM team; inpatient rehab consult pending   Algis Lehenbauer HOWELLS 06/15/2014, 8:05 AM  [phone]

## 2014-06-15 NOTE — Progress Notes (Signed)
Physical Therapy Treatment Patient Details Name: Melissa Lambert MRN: [id number] DOB: 31-Jan-1929 Today's Date: 06/15/2014    History of Present Illness Fall with femur fx, now s/p IM nail. PMHx:HTN, asthma, PE, protein deficiences    PT Comments    Pt is progressing with her mobility and was able to stand and pivot with two person assist x 2 during today's session.  She took good weight on her feet and I anticipate pre gait with RW next session.  She was also able to do more hip exercises today than yesterday.  She is motivated to get stronger and more independent and is appropriate for CIR level therapies if approved.  PT will continue to follow acutely.   Follow Up Recommendations  CIR     Equipment Recommendations  None recommended by PT    Recommendations for Other Services Rehab consult     Precautions / Restrictions Precautions Precautions: Fall Restrictions RLE Weight Bearing: Weight bearing as tolerated    Mobility  Bed Mobility Overal bed mobility: Needs Assistance Bed Mobility: Supine to Sit     Supine to sit: +2 for physical assistance;Mod assist     General bed mobility comments: Two person mod assist to get to sitting.  Min assist to progress legs over to EOB, but two person mod assist to progress hips and support trunk.  Pt pulling with bil arms on bed rail to assist.   Transfers Overall transfer level: Needs assistance Equipment used: 2 person hand held assist Transfers: Sit to/from Stand;Stand Pivot Transfers Sit to Stand: +2 physical assistance;Max assist Stand pivot transfers: +2 safety/equipment;Max assist       General transfer comment: Two person max assist to stand and pivot x2 from elevated bed and elevated BSC to the recliner chair.  Verbal cues for hand placement, assist at trunk to lift and lower and support trunk and block right knee as pt pivoted to her left both times.  Third person needed for peri care after toileting.   Ambulation/Gait              General Gait Details: Not quite ready yet.  likely ready for pre gait with RW tomorrow.           Balance Overall balance assessment: Needs assistance Sitting-balance support: Feet supported;No upper extremity supported;Bilateral upper extremity supported;Single extremity supported Sitting balance-Leahy Scale: Fair     Standing balance support: Bilateral upper extremity supported Standing balance-Leahy Scale: Zero Standing balance comment: Two person max assist needed in standing.                     Cognition Arousal/Alertness: Awake/alert Behavior During Therapy: WFL for tasks assessed/performed Overall Cognitive Status: Within Functional Limits for tasks assessed                      Exercises Total Joint Exercises Ankle Circles/Pumps: AROM;Both;20 reps;Seated Quad Sets: AROM;Right;10 reps;Seated Heel Slides: AAROM;Right;10 reps;Seated Hip ABduction/ADduction: AAROM;Right;10 reps;Seated Long Arc Quad: AAROM;Right;10 reps;Seated        Pertinent Vitals/Pain Pain Assessment: Faces Faces Pain Scale: Hurts a little bit Pain Location: right leg Pain Descriptors / Indicators: Burning;Aching Pain Intervention(s): Limited activity within patient's tolerance;Monitored during session;Repositioned           PT Goals (current goals can now be found in the care plan section) Acute Rehab PT Goals Patient Stated Goal: to go home and avoid SNF Progress towards PT goals: Progressing toward goals    Frequency  Min 5X/week    PT Plan Current plan remains appropriate       End of Session Equipment Utilized During Treatment: Gait belt;Oxygen Activity Tolerance: Patient limited by fatigue;Patient limited by pain Patient left: in chair;with call bell/phone within reach;with family/visitor present     Time: 9563-8756 PT Time Calculation (min): 33 min  Charges:  $Therapeutic Exercise: 8-22 mins $Therapeutic Activity: 8-22 mins                       Aladdin Kollmann B. Heru Montz, PT, DPT [phone]   06/15/2014, 3:29 PM

## 2014-06-15 NOTE — Progress Notes (Signed)
ANTICOAGULATION CONSULT NOTE - Follow Up Consult  Pharmacy Consult for Coumadin Indication: VTE prophylaxis and hx PE and Protein C and S deficiencies  Allergies  Allergen Reactions  . Adhesive [Tape] Itching  . Penicillins Itching, Swelling and Rash    Streptomycin also  . Tuberculin Tests Itching, Swelling and Rash    Patient Measurements: Height: 5\' 1"  (154.9 cm) Weight: 198 lb (89.812 kg) IBW/kg (Calculated) : 47.8  Vital Signs: Temp: 98.7 F (37.1 C) (11/04 1300) Temp Source: Axillary (11/04 0536) BP: 142/62 mmHg (11/04 1300) Pulse Rate: 81 (11/04 1300)  Labs:  Recent Labs  06/13/14 0318 06/13/14 1344 06/14/14 0640 06/15/14 0613  HGB  --  9.9* 9.4* 9.1*  HCT  --  29.7* 27.9* 27.0*  PLT  --  186 165 168  LABPROT 21.4*  --  23.6* 27.8*  INR 1.83*  --  2.08* 2.56*  CREATININE  --  0.87 0.75 0.64    Estimated Creatinine Clearance: 54.3 mL/min (by C-G formula based on Cr of 0.64).  Assessment:   INR up to 2.56 today, after Coumadin 10 mg on 11/1, then 7.5 mg on 11/2. INR may rise again by am.  Home regimen: 7.5 mg daily, except 10 mg on Tuesdays and Saturdays. CBC has trended down POD#2 right hip IM nailing. To transfer to Rehab when bed available.  Goal of Therapy:  INR 2-3 Monitor platelets by anticoagulation protocol: Yes   Plan:   Reduce Coumadin to 5 mg today.  Continue daily PT/INR.  Follow up am CBC; monitor for any bleeding.  Arty Baumgartner, [place] Pager: [phone] 06/15/2014,4:16 PM

## 2014-06-16 ENCOUNTER — Inpatient Hospital Stay (HOSPITAL_COMMUNITY)
Admission: RE | Admit: 2014-06-16 | Discharge: 2014-06-30 | DRG: 091 | Disposition: A | Discharge status: Home Health | Payer: Medicare Other | Source: Intra-hospital | Attending: Physical Medicine & Rehabilitation | Admitting: Physical Medicine & Rehabilitation

## 2014-06-16 ENCOUNTER — Encounter (HOSPITAL_COMMUNITY): Payer: Self-pay | Admitting: *Deleted

## 2014-06-16 DIAGNOSIS — Z6838 Body mass index (BMI) 38.0-38.9, adult: Secondary | ICD-10-CM | POA: Diagnosis not present

## 2014-06-16 DIAGNOSIS — F419 Anxiety disorder, unspecified: Secondary | ICD-10-CM | POA: Diagnosis present

## 2014-06-16 DIAGNOSIS — E669 Obesity, unspecified: Secondary | ICD-10-CM | POA: Diagnosis present

## 2014-06-16 DIAGNOSIS — Z880 Allergy status to penicillin: Secondary | ICD-10-CM

## 2014-06-16 DIAGNOSIS — D6859 Other primary thrombophilia: Secondary | ICD-10-CM | POA: Diagnosis present

## 2014-06-16 DIAGNOSIS — M1990 Unspecified osteoarthritis, unspecified site: Secondary | ICD-10-CM | POA: Diagnosis present

## 2014-06-16 DIAGNOSIS — Z86711 Personal history of pulmonary embolism: Secondary | ICD-10-CM | POA: Diagnosis present

## 2014-06-16 DIAGNOSIS — S7221XA Displaced subtrochanteric fracture of right femur, initial encounter for closed fracture: Secondary | ICD-10-CM | POA: Diagnosis present

## 2014-06-16 DIAGNOSIS — I10 Essential (primary) hypertension: Secondary | ICD-10-CM | POA: Insufficient documentation

## 2014-06-16 DIAGNOSIS — N3281 Overactive bladder: Secondary | ICD-10-CM | POA: Diagnosis present

## 2014-06-16 DIAGNOSIS — K219 Gastro-esophageal reflux disease without esophagitis: Secondary | ICD-10-CM | POA: Diagnosis present

## 2014-06-16 DIAGNOSIS — S72001D Fracture of unspecified part of neck of right femur, subsequent encounter for closed fracture with routine healing: Secondary | ICD-10-CM

## 2014-06-16 DIAGNOSIS — M24512 Contracture, left shoulder: Secondary | ICD-10-CM

## 2014-06-16 DIAGNOSIS — D62 Acute posthemorrhagic anemia: Secondary | ICD-10-CM | POA: Diagnosis present

## 2014-06-16 DIAGNOSIS — S7291XA Unspecified fracture of right femur, initial encounter for closed fracture: Secondary | ICD-10-CM | POA: Diagnosis present

## 2014-06-16 DIAGNOSIS — Z7901 Long term (current) use of anticoagulants: Secondary | ICD-10-CM

## 2014-06-16 DIAGNOSIS — K5900 Constipation, unspecified: Secondary | ICD-10-CM | POA: Diagnosis present

## 2014-06-16 DIAGNOSIS — M25551 Pain in right hip: Secondary | ICD-10-CM | POA: Diagnosis present

## 2014-06-16 DIAGNOSIS — J45909 Unspecified asthma, uncomplicated: Secondary | ICD-10-CM | POA: Diagnosis present

## 2014-06-16 DIAGNOSIS — S72009A Fracture of unspecified part of neck of unspecified femur, initial encounter for closed fracture: Secondary | ICD-10-CM | POA: Diagnosis present

## 2014-06-16 DIAGNOSIS — R269 Unspecified abnormalities of gait and mobility: Secondary | ICD-10-CM | POA: Diagnosis present

## 2014-06-16 DIAGNOSIS — M24519 Contracture, unspecified shoulder: Secondary | ICD-10-CM | POA: Diagnosis present

## 2014-06-16 LAB — PROTIME-INR
INR: 2.53 — ABNORMAL HIGH (ref 0.00–1.49)
PROTHROMBIN TIME: 27.5 s — AB (ref 11.6–15.2)

## 2014-06-16 MED ORDER — ONDANSETRON HCL 4 MG PO TABS: 4.0000 mg | ORAL_TABLET | Freq: Four times a day (QID) | ORAL | Status: DC | PRN

## 2014-06-16 MED ORDER — ONDANSETRON HCL 4 MG/2ML IJ SOLN: 4.0000 mg | Freq: Four times a day (QID) | INTRAMUSCULAR | Status: DC | PRN

## 2014-06-16 MED ORDER — CALCIUM CARBONATE-VITAMIN D 500-200 MG-UNIT PO TABS
1.0000 | ORAL_TABLET | Freq: Two times a day (BID) | ORAL | Status: DC
  Administered 2014-06-16 – 2014-06-30 (×28): 1 via ORAL
  Filled 2014-06-16 (×32): qty 1

## 2014-06-16 MED ORDER — SORBITOL 70 % SOLN: 30.0000 mL | Freq: Every day | Status: DC | PRN

## 2014-06-16 MED ORDER — WARFARIN SODIUM 7.5 MG PO TABS
7.5000 mg | ORAL_TABLET | Freq: Once | ORAL | Status: AC
  Administered 2014-06-16: 7.5 mg via ORAL
  Filled 2014-06-16: qty 1

## 2014-06-16 MED ORDER — OXYBUTYNIN CHLORIDE ER 5 MG PO TB24
5.0000 mg | ORAL_TABLET | Freq: Every day | ORAL | Status: DC
  Administered 2014-06-17 – 2014-06-30 (×14): 5 mg via ORAL
  Filled 2014-06-16 (×17): qty 1

## 2014-06-16 MED ORDER — SENNA 8.6 MG PO TABS
2.0000 | ORAL_TABLET | Freq: Two times a day (BID) | ORAL | Status: DC
  Administered 2014-06-16 – 2014-06-30 (×25): 17.2 mg via ORAL
  Filled 2014-06-16 (×32): qty 2

## 2014-06-16 MED ORDER — ACETAMINOPHEN 325 MG PO TABS
325.0000 mg | ORAL_TABLET | ORAL | Status: DC | PRN
  Administered 2014-06-25 – 2014-06-27 (×2): 650 mg via ORAL
  Filled 2014-06-16 (×3): qty 2

## 2014-06-16 MED ORDER — TRAZODONE HCL 50 MG PO TABS
50.0000 mg | ORAL_TABLET | Freq: Every evening | ORAL | Status: DC | PRN
  Administered 2014-06-24 – 2014-06-29 (×5): 50 mg via ORAL
  Filled 2014-06-16 (×5): qty 1

## 2014-06-16 MED ORDER — WARFARIN SODIUM 7.5 MG PO TABS
7.5000 mg | ORAL_TABLET | Freq: Once | ORAL | Status: DC
  Filled 2014-06-16: qty 1

## 2014-06-16 MED ORDER — DOCUSATE SODIUM 100 MG PO CAPS
100.0000 mg | ORAL_CAPSULE | Freq: Two times a day (BID) | ORAL | Status: DC
  Administered 2014-06-16 – 2014-06-30 (×25): 100 mg via ORAL
  Filled 2014-06-16 (×31): qty 1

## 2014-06-16 MED ORDER — PANTOPRAZOLE SODIUM 40 MG PO TBEC
40.0000 mg | DELAYED_RELEASE_TABLET | Freq: Every day | ORAL | Status: DC
  Administered 2014-06-17 – 2014-06-30 (×14): 40 mg via ORAL
  Filled 2014-06-16 (×15): qty 1

## 2014-06-16 MED ORDER — HYDROCODONE-ACETAMINOPHEN 5-325 MG PO TABS
1.0000 | ORAL_TABLET | Freq: Four times a day (QID) | ORAL | Status: DC | PRN
  Administered 2014-06-16 – 2014-06-17 (×4): 1 via ORAL
  Administered 2014-06-18: 2 via ORAL
  Administered 2014-06-18: 1 via ORAL
  Administered 2014-06-18: 2 via ORAL
  Administered 2014-06-19 – 2014-06-23 (×11): 1 via ORAL
  Administered 2014-06-23 – 2014-06-30 (×19): 2 via ORAL
  Filled 2014-06-16 (×3): qty 1
  Filled 2014-06-16: qty 2
  Filled 2014-06-16: qty 1
  Filled 2014-06-16 (×8): qty 2
  Filled 2014-06-16 (×3): qty 1
  Filled 2014-06-16: qty 2
  Filled 2014-06-16: qty 1
  Filled 2014-06-16 (×2): qty 2
  Filled 2014-06-16 (×2): qty 1
  Filled 2014-06-16 (×2): qty 2
  Filled 2014-06-16 (×2): qty 1
  Filled 2014-06-16 (×3): qty 2
  Filled 2014-06-16 (×2): qty 1
  Filled 2014-06-16: qty 2
  Filled 2014-06-16 (×2): qty 1
  Filled 2014-06-16: qty 2
  Filled 2014-06-16: qty 1
  Filled 2014-06-16 (×2): qty 2

## 2014-06-16 MED ORDER — ALBUTEROL SULFATE (2.5 MG/3ML) 0.083% IN NEBU: 2.5000 mg | INHALATION_SOLUTION | RESPIRATORY_TRACT | Status: DC | PRN

## 2014-06-16 MED ORDER — LORATADINE 10 MG PO TABS
10.0000 mg | ORAL_TABLET | Freq: Every day | ORAL | Status: DC
  Administered 2014-06-17 – 2014-06-30 (×14): 10 mg via ORAL
  Filled 2014-06-16 (×18): qty 1

## 2014-06-16 MED ORDER — FUROSEMIDE 10 MG/ML IJ SOLN
20.0000 mg | Freq: Once | INTRAMUSCULAR | Status: AC
  Administered 2014-06-16: 20 mg via INTRAVENOUS
  Filled 2014-06-16: qty 2

## 2014-06-16 MED ORDER — WARFARIN - PHARMACIST DOSING INPATIENT
Freq: Every day | Status: DC
Start: 2014-06-16 — End: 2014-06-30
  Administered 2014-06-20 – 2014-06-23 (×3)

## 2014-06-16 NOTE — H&P (Signed)
Physical Medicine and Rehabilitation Admission H&P   Chief Complaint  Patient presents with  . Hip Injury    right  :  Chief Complaint.Hip pain  HPI: Melissa Lambert is a 80 y.o.right handed female with history of protein C and protein S deficiency/pulmonary emboli maintained on chronic Coumadin. Patient lives alone and used a cane/walker prior to admission. Her sister lives next door and provides assistance as needed Admitted 06/12/2014 after mechanical fall at home landing on her right side without loss of consciousness.Cranial CT scan negative for acute changes. X-rays and imaging revealed right subtrochanteric femur fracture. Underwent open treatment of right subtrochanteric femur fracture with intramedullary nailing 06/13/2014 per Dr. Doran Durand. Weightbearing as tolerated right lower extremity. Hospital course pain management. Chronic Coumadin resumed. Acute blood loss anemia 9.4 and monitored. Physical and occupational therapy evaluation completed 06/14/2014 with recommendations of physical medicine rehabilitation consult. Patient was admitted for a comprehensive rehab program.  Patient denies any breathing problems although she does have oxygen on. Does not use oxygen at home Having some problems sleeping due to right hip pain. Generally does not use any sleep medications at home. Sister notes fear of falling. ROS Review of Systems  Cardiovascular: Positive for leg swelling.  Gastrointestinal: Positive for constipation.   GERD  Musculoskeletal: Positive for myalgias and falls.  All other systems reviewed and are negative Past Medical History  Diagnosis Date  . Hypertension   . Asthma   . PE (pulmonary embolism)   . Protein C deficiency   . Protein S deficiency    Past Surgical History  Procedure Laterality Date  . Femur im nail Right 06/12/2014    Procedure: INTRAMEDULLARY (IM) NAIL FEMORAL; Surgeon: Wylene Simmer, MD; Location: [place]; Service: Orthopedics; Laterality: Right;   History reviewed. No pertinent family history. Social History:  reports that she has never smoked. She does not have any smokeless tobacco history on file. She reports that she does not drink alcohol or use illicit drugs. Allergies:  Allergies  Allergen Reactions  . Adhesive [Tape] Itching  . Penicillins Itching, Swelling and Rash    Streptomycin also  . Tuberculin Tests Itching, Swelling and Rash   Medications Prior to Admission  Medication Sig Dispense Refill  . amLODipine (NORVASC) 2.5 MG tablet Take 2.5-5 mg by mouth 2 (two) times daily. Takes 29m in am and 2.563min pm    . Calcium Carbonate-Vit D-Min (CALTRATE 600+D PLUS MINERALS) 600-800 MG-UNIT TABS Take 1 tablet by mouth 2 (two) times daily.    . cetirizine (ZYRTEC) 10 MG tablet Take 10 mg by mouth daily.    . Marland Kitchenocusate sodium (COLACE) 100 MG capsule Take 400-500 mg by mouth at bedtime.    . Marland Kitchenmeprazole (PRILOSEC OTC) 20 MG tablet Take 20 mg by mouth daily.    . Marland Kitchenxybutynin (DITROPAN-XL) 5 MG 24 hr tablet Take 5 mg by mouth daily.     . valsartan-hydrochlorothiazide (DIOVAN-HCT) 320-25 MG per tablet Take 1 tablet by mouth daily.    . Marland Kitchenarfarin (COUMADIN) 5 MG tablet Take 7.5-10 mg by mouth daily at 6 PM. Takes 102mn Tues and Sat Takes 7.5mg70ml other days      Home: Home Living Family/patient expects to be discharged to:: Inpatient rehab  Functional History: Prior Function Level of Independence: Independent with assistive device(s) Comments: Amb with cane. Sleeps in lift chair.  Functional Status:  Mobility: Bed Mobility Overal bed mobility: Needs Assistance, +2 for physical assistance Bed Mobility: Supine to Sit, Sit to Supine  Supine to sit: Total assist, +2 for physical assistance (pt A with moving L LE only) Sit to supine: +2 for physical assistance, Total assist General bed mobility comments: Pt able to  A with moving L LE over to the side, but had to use pads to get pt to EOB. Transfers Overall transfer level: Needs assistance Equipment used: Rolling walker (2 wheeled) Transfers: Sit to/from Stand General transfer comment: Unable today to do with +2 A--not safe. We were then going to use the maxi-move to get her in the recliner however no lift sling available on the unit--asked nursing secretary to order one for next session. Pt able to clear bottom on 3rd trial with extremely flexed posture.      ADL: ADL Overall ADL's : Needs assistance/impaired Eating/Feeding: Independent, Bed level Grooming: Set up, Bed level Upper Body Bathing: Moderate assistance, Sitting (or bed level due to pt's body habitus) Lower Body Bathing: Total assistance, Bed level Upper Body Dressing : Total assistance, Bed level, Sitting Lower Body Dressing: Total assistance, Bed level  Cognition: Cognition Overall Cognitive Status: Within Functional Limits for tasks assessed Orientation Level: Oriented X4 Cognition Arousal/Alertness: Awake/alert Behavior During Therapy: WFL for tasks assessed/performed Overall Cognitive Status: Within Functional Limits for tasks assessed  Physical Exam: Blood pressure 95/37, pulse 69, temperature 98 F (36.7 C), temperature source Axillary, resp. rate 18, height 5' 1"  (1.549 m), weight 89.812 kg (198 lb), SpO2 94 %. Physical Exam Constitutional: She is oriented to person, place, and time.  HENT:  Head: Normocephalic.  Eyes: EOM are normal.  Neck: Normal range of motion. Neck supple. No thyromegaly present.  Cardiovascular: Normal rate and regular rhythm.  Respiratory: Effort normal and breath sounds normal. No respiratory distress.  GI: Soft. Bowel sounds are normal. She exhibits no distension.  Neurological: She is alert and oriented to person, place, and time.  Patient is a bit hard of hearing. Oriented 3 and follows commands  Skin:  Right hip surgical site  dressed and appropriately tender  3 minus left deltoid, 4/5 right deltoid, 4/5 bilateral biceps triceps and grip 2 minus right hip flexion 3 minus knee extension for ankle dorsiflexion plantar flexion 4/5 in the left hip flexor and extensor ankle dorsi flexors and plantar flexor Sensory intact to light touch bilateral lower extremity 1+ edema bilateral pedal  Lab Results Last 48 Hours    Results for orders placed or performed during the hospital encounter of 06/12/14 (from the past 48 hour(s))  CBC Status: Abnormal   Collection Time: 06/13/14 1:44 PM  Result Value Ref Range   WBC 6.2 4.0 - 10.5 K/uL   RBC 3.28 (L) 3.87 - 5.11 MIL/uL   Hemoglobin 9.9 (L) 12.0 - 15.0 g/dL   HCT 29.7 (L) 36.0 - 46.0 %   MCV 90.5 78.0 - 100.0 fL   MCH 30.2 26.0 - 34.0 pg   MCHC 33.3 30.0 - 36.0 g/dL   RDW 13.9 11.5 - 15.5 %   Platelets 186 150 - 400 K/uL  Basic metabolic panel Status: Abnormal   Collection Time: 06/13/14 1:44 PM  Result Value Ref Range   Sodium 132 (L) 137 - 147 mEq/L   Potassium 4.1 3.7 - 5.3 mEq/L   Chloride 97 96 - 112 mEq/L   CO2 25 19 - 32 mEq/L   Glucose, Bld 144 (H) 70 - 99 mg/dL   BUN 17 6 - 23 mg/dL   Creatinine, Ser 0.87 0.50 - 1.10 mg/dL   Calcium 7.9 (L) 8.4 -  10.5 mg/dL   GFR calc non Af Amer 60 (L) >90 mL/min   GFR calc Af Amer 69 (L) >90 mL/min    Comment: (NOTE) The eGFR has been calculated using the CKD EPI equation. This calculation has not been validated in all clinical situations. eGFR's persistently <90 mL/min signify possible Chronic Kidney Disease.    Anion gap 10 5 - 15  Protime-INR Status: Abnormal   Collection Time: 06/14/14 6:40 AM  Result Value Ref Range   Prothrombin Time 23.6 (H) 11.6 - 15.2 seconds   INR 2.08 (H) 0.00 - 1.49  CBC Status: Abnormal   Collection Time: 06/14/14 6:40 AM  Result Value Ref Range    WBC 5.8 4.0 - 10.5 K/uL   RBC 3.13 (L) 3.87 - 5.11 MIL/uL   Hemoglobin 9.4 (L) 12.0 - 15.0 g/dL   HCT 27.9 (L) 36.0 - 46.0 %   MCV 89.1 78.0 - 100.0 fL   MCH 30.0 26.0 - 34.0 pg   MCHC 33.7 30.0 - 36.0 g/dL   RDW 14.1 11.5 - 15.5 %   Platelets 165 150 - 400 K/uL  Basic metabolic panel Status: Abnormal   Collection Time: 06/14/14 6:40 AM  Result Value Ref Range   Sodium 129 (L) 137 - 147 mEq/L   Potassium 4.0 3.7 - 5.3 mEq/L   Chloride 96 96 - 112 mEq/L   CO2 24 19 - 32 mEq/L   Glucose, Bld 127 (H) 70 - 99 mg/dL   BUN 17 6 - 23 mg/dL   Creatinine, Ser 0.75 0.50 - 1.10 mg/dL   Calcium 8.1 (L) 8.4 - 10.5 mg/dL   GFR calc non Af Amer 76 (L) >90 mL/min   GFR calc Af Amer 88 (L) >90 mL/min    Comment: (NOTE) The eGFR has been calculated using the CKD EPI equation. This calculation has not been validated in all clinical situations. eGFR's persistently <90 mL/min signify possible Chronic Kidney Disease.    Anion gap 9 5 - 15  Protime-INR Status: Abnormal   Collection Time: 06/15/14 6:13 AM  Result Value Ref Range   Prothrombin Time 27.8 (H) 11.6 - 15.2 seconds   INR 2.56 (H) 0.00 - 1.49  CBC Status: Abnormal   Collection Time: 06/15/14 6:13 AM  Result Value Ref Range   WBC 6.5 4.0 - 10.5 K/uL   RBC 3.03 (L) 3.87 - 5.11 MIL/uL   Hemoglobin 9.1 (L) 12.0 - 15.0 g/dL   HCT 27.0 (L) 36.0 - 46.0 %   MCV 89.1 78.0 - 100.0 fL   MCH 30.0 26.0 - 34.0 pg   MCHC 33.7 30.0 - 36.0 g/dL   RDW 13.9 11.5 - 15.5 %   Platelets 168 150 - 400 K/uL  Basic metabolic panel Status: Abnormal   Collection Time: 06/15/14 6:13 AM  Result Value Ref Range   Sodium 131 (L) 137 - 147 mEq/L   Potassium 4.4 3.7 - 5.3 mEq/L   Chloride 98 96 - 112 mEq/L   CO2 26 19 - 32 mEq/L   Glucose, Bld 122 (H) 70 - 99 mg/dL    BUN 12 6 - 23 mg/dL   Creatinine, Ser 0.64 0.50 - 1.10 mg/dL   Calcium 8.2 (L) 8.4 - 10.5 mg/dL   GFR calc non Af Amer 80 (L) >90 mL/min   GFR calc Af Amer >90 >90 mL/min    Comment: (NOTE) The eGFR has been calculated using the CKD EPI equation. This calculation has not been validated in all clinical  situations. eGFR's persistently <90 mL/min signify possible Chronic Kidney Disease.    Anion gap 7 5 - 15      Imaging Results (Last 48 hours)    No results found.       Medical Problem List and Plan: 1. Functional deficits secondary to right subtrochanteric femur fracture. Status post IM nailing 06/13/2014. Weightbearing as tolerated 2. DVT Prophylaxis/Anticoagulation: chronic Coumadin protein C protein S deficiency/pulmonary emboli. Monitor for any bleeding episodes 3. Pain Management: Hydrocodone as needed. Monitor with increased mobility 4. Acute blood loss anemia. Follow-up CBC 5. Neuropsych: This patient is capable of making decisions on her own behalf. 6. Skin/Wound Care: routine skin checks 7. Fluids/Electrolytes/Nutrition: follow-up chemistries. Strict I and O's 8.Hypertension. No present antihypertensive medication. Patient on Norvasc 2.5 mg twice a day prior to admission and Diovan 320-25 milligrams daily.Monitor with increased mobility and resume antihypertensive medications as needed 9.Overactive Bladder. Ditropan 5 mg daily.Check PVRs 3 10.GERD. Protonix 11. Left shoulder contractures secondary to history of severe osteoarthritis and total shoulder arthroplasty  Post Admission Physician Evaluation: Functional deficits secondary to  right subtrochanteric femur fracture. Status post IM nailing 06/13/2014. Weightbearing as tolerated 1. . 2. Patient is admitted to receive collaborative, interdisciplinary care between the physiatrist, rehab nursing staff, and therapy team. 3. Patient's level of medical complexity and substantial therapy needs  in context of that medical necessity cannot be provided at a lesser intensity of care such as a SNF. 4. Patient has experienced substantial functional loss from his/her baseline which was documented above under the "Functional History" and "Functional Status" headings. Judging by the patient's diagnosis, physical exam, and functional history, the patient has potential for functional progress which will result in measurable gains while on inpatient rehab. These gains will be of substantial and practical use upon discharge in facilitating mobility and self-care at the household level. 5. Physiatrist will provide 24 hour management of medical needs as well as oversight of the therapy plan/treatment and provide guidance as appropriate regarding the interaction of the two. 6. 24 hour rehab nursing will assist with bladder management, bowel management, safety, skin/wound care, disease management, medication administration, pain management and patient education and help integrate therapy concepts, techniques,education, etc. 7. PT will assess and treat for/with: pre gait, gait training, endurance , safety, equipment, neuromuscular re education, Address fear falling. Goals are: Reduce fear of falling, supervision with mobility. 8. OT will assess and treat for/with: ADLs, Cognitive perceptual skills, Neuromuscular re education, safety, endurance, equipment. Goals are: Reduce fear falling during ADLs, min assist lower body ADLs modified upper body. Therapy may not proceed with showering this patient. 9. SLP will assess and treat for/with: NA. Goals are: NA. 10. Case Management and Social Worker will assess and treat for psychological issues and discharge planning. 11. Team conference will be held weekly to assess progress toward goals and to determine barriers to discharge. 12. Patient will receive at least 3 hours of therapy per day at least 5 days per week. 13. ELOS: 10-14  14. Prognosis:  excellent     Charlett Blake M.D. [facility] FAAPM&R (Sports Med, Neuromuscular Med) Diplomate Am Board of Electrodiagnostic Med  06/15/2014

## 2014-06-16 NOTE — Progress Notes (Signed)
PMR Admission Coordinator Pre-Admission Assessment  Patient: Melissa Lambert is an 80 y.o., female MRN: [id number] DOB: 12-27-1928 Height: 5\' 1"  (154.9 cm) Weight: 89.812 kg (198 lb)  Insurance Information HMO No PPO: PCP: IPA: 80/20: OTHER:  PRIMARY: Medicare A/B Policy#: [id number] Subscriber: Dorthula Rue CM Name: Phone#: Fax#:  Pre-Cert#: Employer: Retired Benefits: Phone #: Name: Checked in [place]. Date: 11/11/95 Deduct: $1260 Out of Pocket Max: none Life Max: unlimited CIR: 100% SNF: 100 days Outpatient: 80% Co-Pay: 20% Home Health: 100% Co-Pay: none DME: 80% Co-Pay: 20% Providers: patient's choice  SECONDARY: AARP Policy#: [id number] Subscriber: Dorthula Rue CM Name: Phone#: Fax#:  Pre-Cert#: Employer: Retired Benefits: Phone #: [phone] Name:  Eff. Date: Deduct: Out of Pocket Max: Life Max:  CIR: SNF:  Outpatient: Co-Pay:  Home Health: Co-Pay:  DME: Co-Pay:    Emergency Contact Information Contact Information    Name Relation Home Work Mobile   Aheron,Jacqueline Sister [phone]       Current Medical History  Patient Admitting Diagnosis: R subtrochanteric femur fracture  History of Present Illness: An 80 y.o.right handed female with history of protein C and protein S deficiency/pulmonary emboli maintained on chronic Coumadin. Patient lives alone and used a cane/walker prior to admission. Her sister lives next door and provides assistance as needed. Admitted 06/12/2014 after mechanical fall at home landing on her right side without loss of consciousness.Cranial CT scan negative for acute changes. X-rays  and imaging revealed right subtrochanteric femur fracture. Underwent open treatment of right subtrochanteric femur fracture with intramedullary nailing 06/13/2014 per Dr. Doran Durand. Weightbearing as tolerated right lower extremity. Hospital course pain management. Chronic Coumadin resumed. Acute blood loss anemia 9.4 and monitored. Physical and occupational therapy evaluation completed 06/14/2014 with recommendations of physical medicine rehabilitation consult. Patient to be admitted for a comprehensive inpatient rehab program.   Past Medical History  Past Medical History  Diagnosis Date  . Hypertension   . Asthma   . PE (pulmonary embolism)   . Protein C deficiency   . Protein S deficiency     Family History  family history is not on file.  Prior Rehab/Hospitalizations: Had [place] therapies and then outpatient therapy for her shoulder about 5 yrs ago.  Current Medications  Current facility-administered medications: acetaminophen (TYLENOL) tablet 650 mg, 650 mg, Oral, Q6H PRN, 650 mg at 06/14/14 1003 **OR** acetaminophen (TYLENOL) suppository 650 mg, 650 mg, Rectal, Q6H PRN, Wylene Simmer, MD; albuterol (PROVENTIL) (2.5 MG/3ML) 0.083% nebulizer solution 2.5 mg, 2.5 mg, Nebulization, Q2H PRN, Jonetta Osgood, MD calcium-vitamin D (OSCAL WITH D) 500-200 MG-UNIT per tablet 1 tablet, 1 tablet, Oral, BID, Jared M Gardner, DO, 1 tablet at 06/16/14 1033; docusate sodium (COLACE) capsule 100 mg, 100 mg, Oral, BID, Wylene Simmer, MD, 100 mg at 06/16/14 1033; HYDROcodone-acetaminophen (NORCO/VICODIN) 5-325 MG per tablet 1-2 tablet, 1-2 tablet, Oral, Q6H PRN, Wylene Simmer, MD, 2 tablet at 06/15/14 0200 loratadine (CLARITIN) tablet 10 mg, 10 mg, Oral, Daily, Jared M Gardner, DO, 10 mg at 06/16/14 1033; menthol-cetylpyridinium (CEPACOL) lozenge 3 mg, 1 lozenge, Oral, PRN **OR** phenol (CHLORASEPTIC) mouth spray 1 spray, 1 spray, Mouth/Throat, PRN, Wylene Simmer, MD; metoCLOPramide  (REGLAN) tablet 5-10 mg, 5-10 mg, Oral, Q8H PRN **OR** metoCLOPramide (REGLAN) injection 5-10 mg, 5-10 mg, Intravenous, Q8H PRN, Wylene Simmer, MD morphine 2 MG/ML injection 0.5 mg, 0.5 mg, Intravenous, Q2H PRN, Wylene Simmer, MD, 0.5 mg at 06/13/14 0601; ondansetron (ZOFRAN) tablet 4 mg, 4 mg, Oral, Q6H PRN **OR** ondansetron (ZOFRAN) injection 4 mg, 4 mg, Intravenous,  Q6H PRN, Wylene Simmer, MD; oxybutynin (DITROPAN-XL) 24 hr tablet 5 mg, 5 mg, Oral, Daily, Jared M Gardner, DO, 5 mg at 06/16/14 1033 pantoprazole (PROTONIX) EC tablet 40 mg, 40 mg, Oral, Daily, Jared M Gardner, DO, 40 mg at 06/16/14 1033; senna (SENOKOT) tablet 17.2 mg, 2 tablet, Oral, BID, Wylene Simmer, MD, 17.2 mg at 06/16/14 1033; sodium chloride 0.9 % bolus 500 mL, 500 mL, Intravenous, PRN, Jonetta Osgood, MD; Warfarin - Pharmacist Dosing Inpatient, , Does not apply, q1800, Etta Quill, DO, 0 at 06/13/14 1800  Patients Current Diet: Diet Carb Modified Diet - low sodium heart healthy  Precautions / Restrictions Precautions Precautions: Fall Restrictions Weight Bearing Restrictions: Yes RLE Weight Bearing: Weight bearing as tolerated   Prior Activity Level Community (5-7x/wk): Eats out a lot. Goes out most days with her sister.   Home Assistive Devices / Equipment Home Assistive Devices/Equipment: Cane (specify quad or straight) (Has a lift chair.)  Prior Functional Level Prior Function Level of Independence: Independent with assistive device(s) Comments: Amb with cane. Sleeps in lift chair.  Current Functional Level Cognition  Overall Cognitive Status: Within Functional Limits for tasks assessed Orientation Level: Oriented X4   Extremity Assessment (includes Sensation/Coordination)  Upper Extremity Assessment: Defer to OT evaluation Lower Extremity Assessment: Generalized weakness;RLE deficits/detail RLE Deficits / Details: limited ROM due to pain    ADLs  Overall ADL's : Needs  assistance/impaired Eating/Feeding: Independent, Bed level Grooming: Set up, Bed level Upper Body Bathing: Moderate assistance, Sitting (or bed level due to pt's body habitus) Lower Body Bathing: Total assistance, Bed level Upper Body Dressing : Total assistance, Bed level, Sitting Lower Body Dressing: Total assistance, Bed level    Mobility  Overal bed mobility: Needs Assistance Bed Mobility: Supine to Sit Supine to sit: +2 for physical assistance, Mod assist Sit to supine: +2 for physical assistance, Total assist General bed mobility comments: Two person mod assist to get to sitting. Min assist to progress legs over to EOB, but two person mod assist to progress hips and support trunk. Pt pulling with bil arms on bed rail to assist.     Transfers  Overall transfer level: Needs assistance Equipment used: 2 person hand held assist Transfers: Sit to/from Stand, Stand Pivot Transfers Sit to Stand: +2 physical assistance, Max assist Stand pivot transfers: +2 safety/equipment, Max assist General transfer comment: Two person max assist to stand and pivot x2 from elevated bed and elevated BSC to the recliner chair. Verbal cues for hand placement, assist at trunk to lift and lower and support trunk and block right knee as pt pivoted to her left both times. Third person needed for peri care after toileting.     Ambulation / Gait / Stairs / Wheelchair Mobility  Ambulation/Gait General Gait Details: Not quite ready yet. likely ready for pre gait with RW tomorrow.     Posture / Balance Overall balance assessment: Needs assistance Sitting-balance support: Feet supported;No upper extremity supported;Bilateral upper extremity supported;Single extremity supported Sitting balance-Leahy Scale: Fair Standing balance support: Bilateral upper extremity supported Standing balance-Leahy Scale: Zero Standing balance comment: Two person max assist needed in standing.    Special needs/care  consideration BiPAP/CPAP No CPM No Continuous Drip IV 0.9% NS 75 ml/hr  Dialysis No  Life Vest No Oxygen None at home, but currently has O2 at 2L [place] Special Bed No Trach Size No Wound Vac (area) No Skin Thin skin, tears easily, is allergic to some products per patient. Has surgical incision right  hip.  Bowel mgmt: Last documented BM 06/12/14 Bladder mgmt: Voiding on bedpan, leaks at times per patient Diabetic mgmt No    Previous Home Environment Living Arrangements: Alone Lives With: Alone Available Help at Discharge: Family, Available 24 hours/day. Patient reports that sister lives about a mile away and has stayed with patient previously as needed. Type of Home: House Home Layout: Two level, Laundry or work area in basement, Able to live on main level with bedroom/bathroom Alternate Level Stairs-Number of Steps: Flight Home Access: Level entry  Discharge Living Setting Plans for Discharge Living Setting: Patient's home, Alone, House (Lived alone, but sister will come stay with her.) Type of Home at Discharge: House Discharge [place]: Two level, Laundry or work area in basement, Able to live on main level with bedroom/bathroom Alternate Level Stairs-Number of Steps: Flight down to basement Discharge Home Access: Level entry  [place] Patient Roles: Other (Comment) (Has 2 sisters.) Contact Information: Charline Bills - sister  Anticipated Caregiver: sister Anticipated Caregiver's Contact Information: Geni Bers - sister [phone] Ability/Limitations of Caregiver: Sister will come and stay with patient and provide supervision. Caregiver Availability: 24/7 Discharge Plan Discussed with Primary Caregiver: Yes Is Caregiver In Agreement with Plan?: Yes Does Caregiver/Family have Issues with Lodging/Transportation while Pt is in Rehab?: No  Goals/Additional Needs Patient/Family Goal for Rehab: PT/OT supervision  goals Expected length of stay: 12-16 days Cultural Considerations: [facility] Dietary Needs: Carb mod med cal, thin liquids Equipment Needs: TBD Pt/Family Agrees to Admission and willing to participate: Yes Program Orientation Provided & Reviewed with Pt/Caregiver Including Roles & Responsibilities: Yes  Decrease burden of Care through IP rehab admission: N/A  Possible need for SNF placement upon discharge: Not planned  Patient Condition: This patient's condition remains as documented in the consult dated 06/15/14, in which the Rehabilitation Physician determined and documented that the patient's condition is appropriate for intensive rehabilitative care in an inpatient rehabilitation facility. Will admit to inpatient rehab today.  Preadmission Screen Completed By: Retta Diones, 06/16/2014 10:58 AM ______________________________________________________________________  Discussed status with Dr. Letta Pate on 06/16/14 at 1058 and received telephone approval for admission today.  Admission Coordinator: Retta Diones, time1058/Date11/05/15          Cosigned by: Charlett Blake, MD at 06/16/2014 11:24 AM

## 2014-06-16 NOTE — Progress Notes (Signed)
ANTICOAGULATION CONSULT NOTE - Follow Up Consult  Pharmacy Consult for coumadin Indication: VTE prophylaxis and hx PE and Protein C and S deficiencies  Allergies  Allergen Reactions  . Adhesive [Tape] Itching  . Penicillins Itching, Swelling and Rash    Streptomycin also  . Tuberculin Tests Itching, Swelling and Rash    Patient Measurements: Height: 5\' 1"  (154.9 cm) Weight: 198 lb (89.812 kg) IBW/kg (Calculated) : 47.8  Vital Signs: Temp: 98.6 F (37 C) (11/05 1300) Temp Source: Oral (11/05 0528) BP: 146/55 mmHg (11/05 1300) Pulse Rate: 78 (11/05 1300)  Labs:  Recent Labs  06/14/14 0640 06/15/14 0613 06/16/14 0433  HGB 9.4* 9.1*  --   HCT 27.9* 27.0*  --   PLT 165 168  --   LABPROT 23.6* 27.8* 27.5*  INR 2.08* 2.56* 2.53*  CREATININE 0.75 0.64  --     Estimated Creatinine Clearance: 54.3 mL/min (by C-G formula based on Cr of 0.64).   Assessment: Anticoagulation Coumadin 7.5 mg daily except 10 mg Tue + Sat at home for h/o PE several years ago. Protein C & S deficiencies; last dose 10/31 (Sat) INR 1.67 on admit 11/1, 1.83 (10 mg)>2.08 (7.5 mg)>2.56 (5mg ) > 2.53. POD #3 right hip IM nail  Infectious Disease - afb, WBC 6.5. [place] x 1 pre-op ~12am 11/2  Cardiovascular - hx HTN; VSS today; Holding home BP meds  Endocrinology - no hx DM, glucose 178  Gastrointestinal / Nutrition - Oscal D, docusate, senokot, po ppi (Prilosec pta)  Neurology - prn Norco or Morphine IV  Nephrology - Scr 0.64, about the same, Na only 126>>131. HCTZ stopped (part of combo BP med). K+ 4.4, crcl ~50-55; Ditropan XL. NS at 100/hr  Pulmonary - Asthma. 95% on 3L; loratidine (for Zyrtec)  Hematology / Oncology - H/o PE 2/2 Protein S/C deficiency; Hgb 11.3>>9.1, PLTC 168, stable  PTA Medication Issues: off amlodipine, Valsartan-HCTZ  Best Practices: SCDs, warf, po ppi   Goal of Therapy:  INR 2-3    Plan:  - Coumadin 7.5 mg x 1 tonight.  Melissa Lambert P 06/16/2014,2:42  PM

## 2014-06-16 NOTE — Plan of Care (Signed)
Problem: Phase III Progression Outcomes Goal: Pain controlled on oral analgesia Outcome: Completed/Met Date Met:  06/16/14 Goal: Activity at appropriate level-compared to baseline (UP IN CHAIR FOR HEMODIALYSIS)  Outcome: Progressing Goal: Incision clean - minimal/no drainage Outcome: Completed/Met Date Met:  06/16/14 Goal: Ambulate BID with assist as able Outcome: Progressing

## 2014-06-16 NOTE — PMR Pre-admission (Signed)
PMR Admission Coordinator Pre-Admission Assessment  Patient: Melissa Lambert is an 80 y.o., female MRN: [id number] DOB: July 30, 1929 Height: 5\' 1"  (154.9 cm) Weight: 89.812 kg (198 lb)              Insurance Information HMO No   PPO:       PCP:       IPA:       80/20:       OTHER:   PRIMARY: Medicare A/B      Policy#: [id number]      Subscriber: Dorthula Rue CM Name:        Phone#:       Fax#:   Pre-Cert#:        Employer:  Retired Benefits:  Phone #:       Name: Checked in [place]. Date: 11/11/95     Deduct: $1260      Out of Pocket Max: none      Life Max: unlimited CIR: 100%      SNF: 100 days Outpatient: 80%     Co-Pay: 20% Home Health: 100%      Co-Pay: none DME: 80%     Co-Pay: 20% Providers: patient's choice  SECONDARY: AARP      Policy#: [id number]      Subscriber: Dorthula Rue CM Name:        Phone#:       Fax#:   Pre-Cert#:        Employer: Retired Benefits:  Phone #: [phone]     Name:   Eff. Date:       Deduct:        Out of Pocket Max:        Life Max:   CIR:        SNF:   Outpatient:       Co-Pay:   Home Health:        Co-Pay:   DME:       Co-Pay:     Emergency Contact Information Contact Information    Name Relation Home Work Mobile   Aheron,Jacqueline Sister [phone]       Current Medical History  Patient Admitting Diagnosis:  R subtrochanteric femur fracture  History of Present Illness: An 80 y.o.right handed female with history of protein C and protein S deficiency/pulmonary emboli maintained on chronic Coumadin. Patient lives alone and used a cane/walker prior to admission. Her sister lives next door and provides assistance as needed. Admitted 06/12/2014 after mechanical fall at home landing on her right side without loss of consciousness.Cranial CT scan negative for acute changes. X-rays and imaging revealed right subtrochanteric femur fracture. Underwent open treatment of right subtrochanteric femur fracture with intramedullary nailing  06/13/2014 per Dr. Doran Durand. Weightbearing as tolerated right lower extremity. Hospital course pain management. Chronic Coumadin resumed. Acute blood loss anemia 9.4 and monitored. Physical and occupational therapy evaluation completed 06/14/2014 with recommendations of physical medicine rehabilitation consult. Patient to be admitted for a comprehensive inpatient rehab program.    Past Medical History  Past Medical History  Diagnosis Date  . Hypertension   . Asthma   . PE (pulmonary embolism)   . Protein C deficiency   . Protein S deficiency     Family History  family history is not on file.  Prior Rehab/Hospitalizations:  Had [place] therapies and then outpatient therapy for her shoulder about 5 yrs ago.   Current Medications  Current facility-administered medications: acetaminophen (TYLENOL) tablet 650 mg, 650 mg, Oral, Q6H PRN, 650 mg  at 06/14/14 1003 **OR** acetaminophen (TYLENOL) suppository 650 mg, 650 mg, Rectal, Q6H PRN, Wylene Simmer, MD;  albuterol (PROVENTIL) (2.5 MG/3ML) 0.083% nebulizer solution 2.5 mg, 2.5 mg, Nebulization, Q2H PRN, Jonetta Osgood, MD calcium-vitamin D (OSCAL WITH D) 500-200 MG-UNIT per tablet 1 tablet, 1 tablet, Oral, BID, Jared M Gardner, DO, 1 tablet at 06/16/14 1033;  docusate sodium (COLACE) capsule 100 mg, 100 mg, Oral, BID, Wylene Simmer, MD, 100 mg at 06/16/14 1033;  HYDROcodone-acetaminophen (NORCO/VICODIN) 5-325 MG per tablet 1-2 tablet, 1-2 tablet, Oral, Q6H PRN, Wylene Simmer, MD, 2 tablet at 06/15/14 0200 loratadine (CLARITIN) tablet 10 mg, 10 mg, Oral, Daily, Jared M Gardner, DO, 10 mg at 06/16/14 1033;  menthol-cetylpyridinium (CEPACOL) lozenge 3 mg, 1 lozenge, Oral, PRN **OR** phenol (CHLORASEPTIC) mouth spray 1 spray, 1 spray, Mouth/Throat, PRN, Wylene Simmer, MD;  metoCLOPramide (REGLAN) tablet 5-10 mg, 5-10 mg, Oral, Q8H PRN **OR** metoCLOPramide (REGLAN) injection 5-10 mg, 5-10 mg, Intravenous, Q8H PRN, Wylene Simmer, MD morphine 2 MG/ML injection 0.5 mg, 0.5  mg, Intravenous, Q2H PRN, Wylene Simmer, MD, 0.5 mg at 06/13/14 0601;  ondansetron (ZOFRAN) tablet 4 mg, 4 mg, Oral, Q6H PRN **OR** ondansetron (ZOFRAN) injection 4 mg, 4 mg, Intravenous, Q6H PRN, Wylene Simmer, MD;  oxybutynin (DITROPAN-XL) 24 hr tablet 5 mg, 5 mg, Oral, Daily, Jared M Gardner, DO, 5 mg at 06/16/14 1033 pantoprazole (PROTONIX) EC tablet 40 mg, 40 mg, Oral, Daily, Jared M Gardner, DO, 40 mg at 06/16/14 1033;  senna (SENOKOT) tablet 17.2 mg, 2 tablet, Oral, BID, Wylene Simmer, MD, 17.2 mg at 06/16/14 1033;  sodium chloride 0.9 % bolus 500 mL, 500 mL, Intravenous, PRN, Jonetta Osgood, MD;  Warfarin - Pharmacist Dosing Inpatient, , Does not apply, q1800, Etta Quill, DO, 0  at 06/13/14 1800  Patients Current Diet: Diet Carb Modified Diet - low sodium heart healthy  Precautions / Restrictions Precautions Precautions: Fall Restrictions Weight Bearing Restrictions: Yes RLE Weight Bearing: Weight bearing as tolerated   Prior Activity Level Community (5-7x/wk): Eats out a lot.  Goes out most days with her sister.   Home Assistive Devices / Equipment Home Assistive Devices/Equipment: Cane (specify quad or straight) (Has a lift chair.)  Prior Functional Level Prior Function Level of Independence: Independent with assistive device(s) Comments: Amb with cane. Sleeps in lift chair.  Current Functional Level Cognition  Overall Cognitive Status: Within Functional Limits for tasks assessed Orientation Level: Oriented X4    Extremity Assessment (includes Sensation/Coordination)  Upper Extremity Assessment: Defer to OT evaluation Lower Extremity Assessment: Generalized weakness;RLE deficits/detail RLE Deficits / Details: limited ROM due to pain    ADLs  Overall ADL's : Needs assistance/impaired Eating/Feeding: Independent, Bed level Grooming: Set up, Bed level Upper Body Bathing: Moderate assistance, Sitting (or bed level due to pt's body habitus) Lower Body Bathing: Total  assistance, Bed level Upper Body Dressing : Total assistance, Bed level, Sitting Lower Body Dressing: Total assistance, Bed level    Mobility  Overal bed mobility: Needs Assistance Bed Mobility: Supine to Sit Supine to sit: +2 for physical assistance, Mod assist Sit to supine: +2 for physical assistance, Total assist General bed mobility comments: Two person mod assist to get to sitting.  Min assist to progress legs over to EOB, but two person mod assist to progress hips and support trunk.  Pt pulling with bil arms on bed rail to assist.     Transfers  Overall transfer level: Needs assistance Equipment used: 2 person hand held assist Transfers: Sit  to/from Stand, Risk manager Sit to Stand: +2 physical assistance, Max assist Stand pivot transfers: +2 safety/equipment, Max assist General transfer comment: Two person max assist to stand and pivot x2 from elevated bed and elevated BSC to the recliner chair.  Verbal cues for hand placement, assist at trunk to lift and lower and support trunk and block right knee as pt pivoted to her left both times.  Third person needed for peri care after toileting.     Ambulation / Gait / Stairs / Wheelchair Mobility  Ambulation/Gait General Gait Details: Not quite ready yet.  likely ready for pre gait with RW tomorrow.     Posture / Balance Overall balance assessment: Needs assistance Sitting-balance support: Feet supported;No upper extremity supported;Bilateral upper extremity supported;Single extremity supported Sitting balance-Leahy Scale: Fair Standing balance support: Bilateral upper extremity supported Standing balance-Leahy Scale: Zero Standing balance comment: Two person max assist needed in standing.    Special needs/care consideration BiPAP/CPAP No CPM No Continuous Drip IV 0.9% NS 75 ml/hr  Dialysis No      Life Vest No Oxygen None at home, but currently has O2 at 2L [place] Special Bed No Trach Size No Wound Vac (area) No Skin  Thin skin, tears easily, is allergic to some products per patient.  Has surgical incision right hip.                             Bowel mgmt: Last documented BM 06/12/14 Bladder mgmt: Voiding on bedpan, leaks at times per patient Diabetic mgmt No    Previous Home Environment Living Arrangements: Alone  Lives With: Alone Available Help at Discharge: Family, Available 24 hours/day.  Patient reports that sister lives about a mile away and has stayed with patient previously as needed. Type of Home: House Home Layout: Two level, Laundry or work area in basement, Able to live on main level with bedroom/bathroom Alternate Level Stairs-Number of Steps: Flight Home Access: Level entry  Discharge Living Setting Plans for Discharge Living Setting: Patient's home, Alone, House (Lived alone, but sister will come stay with her.) Type of Home at Discharge: House Discharge [place]: Two level, Laundry or work area in basement, Able to live on main level with bedroom/bathroom Alternate Level Stairs-Number of Steps: Flight down to basement Discharge Home Access: Level entry  [place] Patient Roles: Other (Comment) (Has 2 sisters.) Contact Information: Charline Bills - sister  Anticipated Caregiver: sister Anticipated Caregiver's Contact Information: Geni Bers - sister [phone] Ability/Limitations of Caregiver: Sister will come and stay with patient and provide supervision. Caregiver Availability: 24/7 Discharge Plan Discussed with Primary Caregiver: Yes Is Caregiver In Agreement with Plan?: Yes Does Caregiver/Family have Issues with Lodging/Transportation while Pt is in Rehab?: No  Goals/Additional Needs Patient/Family Goal for Rehab: PT/OT supervision goals Expected length of stay: 12-16 days Cultural Considerations: [employer] Dietary Needs: Carb mod med cal, thin liquids Equipment Needs: TBD Pt/Family Agrees to Admission and willing to participate: Yes Program  Orientation Provided & Reviewed with Pt/Caregiver Including Roles  & Responsibilities: Yes  Decrease burden of Care through IP rehab admission: N/A  Possible need for SNF placement upon discharge: Not planned  Patient Condition: This patient's condition remains as documented in the consult dated 06/15/14, in which the Rehabilitation Physician determined and documented that the patient's condition is appropriate for intensive rehabilitative care in an inpatient rehabilitation facility. Will admit to inpatient rehab today.  Preadmission Screen Completed By:  Retta Diones,  06/16/2014 10:58 AM ______________________________________________________________________   Discussed status with Dr. Letta Pate on 06/16/14 at 1058 and received telephone approval for admission today.  Admission Coordinator:  Retta Diones, time1058/Date11/05/15

## 2014-06-16 NOTE — Progress Notes (Signed)
Expand All Collapse All        Physical Medicine and Rehabilitation Consult Reason for Consult:Right subtrochanteric femur fracture Referring Physician: [facility]   HPI: Melissa Lambert is a 80 y.o.right handed female with history of protein C and protein S deficiency maintained on chronic Coumadin. Patient lives alone and used a cane/walker prior to admission. Her sister lives next door and provides assistance as needed Admitted 06/12/2014 after mechanical fall at home landing on her right side without loss of consciousness.Cranial CT scan negative for acute changes. X-rays and imaging revealed right subtrochanteric femur fracture. Underwent open treatment of right subtrochanteric femur fracture with intramedullary nailing 06/13/2014 per Dr. Doran Durand. Weightbearing as tolerated right lower extremity. Hospital course pain management. Chronic Coumadin resumed. Acute blood loss anemia 9.4 and monitored. Physical and occupational therapy evaluation completed 06/14/2014 with recommendations of physical medicine rehabilitation consult.  Patient doing well from a pain standpoint. Sister is in the room, states that she can provide 24-hour assistance postdischarge. Review of Systems  Cardiovascular: Positive for leg swelling.  Gastrointestinal: Positive for constipation.   GERD  Musculoskeletal: Positive for myalgias and falls.  All other systems reviewed and are negative.  Past Medical History  Diagnosis Date  . Hypertension   . Asthma   . PE (pulmonary embolism)   . Protein C deficiency   . Protein S deficiency    Past Surgical History  Procedure Laterality Date  . Femur im nail Right 06/12/2014    Procedure: INTRAMEDULLARY (IM) NAIL FEMORAL; Surgeon: Wylene Simmer, MD; Location: [facility]; Service: Orthopedics; Laterality: Right;   History reviewed. No pertinent family history. Social History:  reports that she has never smoked. She does not have any smokeless  tobacco history on file. She reports that she does not drink alcohol or use illicit drugs. Allergies:  Allergies  Allergen Reactions  . Adhesive [Tape] Itching  . Penicillins Itching, Swelling and Rash    Streptomycin also  . Tuberculin Tests Itching, Swelling and Rash   Medications Prior to Admission  Medication Sig Dispense Refill  . amLODipine (NORVASC) 2.5 MG tablet Take 2.5-5 mg by mouth 2 (two) times daily. Takes 5mg  in am and 2.5mg  in pm    . Calcium Carbonate-Vit D-Min (CALTRATE 600+D PLUS MINERALS) 600-800 MG-UNIT TABS Take 1 tablet by mouth 2 (two) times daily.    . cetirizine (ZYRTEC) 10 MG tablet Take 10 mg by mouth daily.    Marland Kitchen docusate sodium (COLACE) 100 MG capsule Take 400-500 mg by mouth at bedtime.    Marland Kitchen omeprazole (PRILOSEC OTC) 20 MG tablet Take 20 mg by mouth daily.    Marland Kitchen oxybutynin (DITROPAN-XL) 5 MG 24 hr tablet Take 5 mg by mouth daily.     . valsartan-hydrochlorothiazide (DIOVAN-HCT) 320-25 MG per tablet Take 1 tablet by mouth daily.    Marland Kitchen warfarin (COUMADIN) 5 MG tablet Take 7.5-10 mg by mouth daily at 6 PM. Takes 10mg  on Tues and Sat Takes 7.5mg  all other days      Home: Home Living Family/patient expects to be discharged to:: Inpatient rehab  Functional History: Prior Function Level of Independence: Independent with assistive device(s) Comments: Amb with cane. Sleeps in lift chair. Functional Status:  Mobility: Bed Mobility Overal bed mobility: Needs Assistance, +2 for physical assistance Bed Mobility: Supine to Sit, Sit to Supine Supine to sit: Total assist, +2 for physical assistance (pt A with moving L LE only) Sit to supine: +2 for physical assistance, Total assist General bed mobility comments: Pt able to  A with moving L LE over to the side, but had to use pads to get pt to EOB. Transfers Overall transfer level: Needs assistance Equipment used: Rolling walker (2 wheeled) Transfers: Sit  to/from Stand General transfer comment: Unable today to do with +2 A--not safe. We were then going to use the maxi-move to get her in the recliner however no lift sling available on the unit--asked nursing secretary to order one for next session. Pt able to clear bottom on 3rd trial with extremely flexed posture.      ADL: ADL Overall ADL's : Needs assistance/impaired Eating/Feeding: Independent, Bed level Grooming: Set up, Bed level Upper Body Bathing: Moderate assistance, Sitting (or bed level due to pt's body habitus) Lower Body Bathing: Total assistance, Bed level Upper Body Dressing : Total assistance, Bed level, Sitting Lower Body Dressing: Total assistance, Bed level  Cognition: Cognition Overall Cognitive Status: Within Functional Limits for tasks assessed Orientation Level: Oriented X4 Cognition Arousal/Alertness: Awake/alert Behavior During Therapy: WFL for tasks assessed/performed Overall Cognitive Status: Within Functional Limits for tasks assessed  Blood pressure 114/49, pulse 81, temperature 97.7 F (36.5 C), temperature source Oral, resp. rate 18, height 5\' 1"  (1.549 m), weight 89.812 kg (198 lb), SpO2 91 %. Physical Exam  Vitals reviewed. Constitutional: She is oriented to person, place, and time.  HENT:  Head: Normocephalic.  Eyes: EOM are normal.  Neck: Normal range of motion. Neck supple. No thyromegaly present.  Cardiovascular: Normal rate and regular rhythm.  Respiratory: Effort normal and breath sounds normal. No respiratory distress.  GI: Soft. Bowel sounds are normal. She exhibits no distension.  Neurological: She is alert and oriented to person, place, and time.  Patient is a bit hard of hearing. Oriented 3 and follows commands  Skin:  Right hip surgical site dressed and appropriately tender  3 minus left deltoid, 4/5 right deltoid, 4/5 bilateral biceps triceps and grip 2 minus right hip flexion 3 minus knee extension for ankle dorsiflexion plantar  flexion 4/5 in the left hip flexor and extensor ankle dorsi flexors and plantar flexor Sensory intact to light touch bilateral lower extremity 1+ edema bilateral pedal   Lab Results Last 24 Hours    Results for orders placed or performed during the hospital encounter of 06/12/14 (from the past 24 hour(s))  Protime-INR Status: Abnormal   Collection Time: 06/14/14 6:40 AM  Result Value Ref Range   Prothrombin Time 23.6 (H) 11.6 - 15.2 seconds   INR 2.08 (H) 0.00 - 1.49  CBC Status: Abnormal   Collection Time: 06/14/14 6:40 AM  Result Value Ref Range   WBC 5.8 4.0 - 10.5 K/uL   RBC 3.13 (L) 3.87 - 5.11 MIL/uL   Hemoglobin 9.4 (L) 12.0 - 15.0 g/dL   HCT 27.9 (L) 36.0 - 46.0 %   MCV 89.1 78.0 - 100.0 fL   MCH 30.0 26.0 - 34.0 pg   MCHC 33.7 30.0 - 36.0 g/dL   RDW 14.1 11.5 - 15.5 %   Platelets 165 150 - 400 K/uL  Basic metabolic panel Status: Abnormal   Collection Time: 06/14/14 6:40 AM  Result Value Ref Range   Sodium 129 (L) 137 - 147 mEq/L   Potassium 4.0 3.7 - 5.3 mEq/L   Chloride 96 96 - 112 mEq/L   CO2 24 19 - 32 mEq/L   Glucose, Bld 127 (H) 70 - 99 mg/dL   BUN 17 6 - 23 mg/dL   Creatinine, Ser 0.75 0.50 - 1.10 mg/dL   Calcium  8.1 (L) 8.4 - 10.5 mg/dL   GFR calc non Af Amer 76 (L) >90 mL/min   GFR calc Af Amer 88 (L) >90 mL/min   Anion gap 9 5 - 15      Imaging Results (Last 48 hours)    Dg Chest 1 View  06/12/2014 CLINICAL DATA: Fall. Initial encounter. RIGHT hip pain radiating to the knee. Preoperative chest radiograph. EXAM: CHEST - 1 VIEW COMPARISON: 06/12/2014. FINDINGS: Cardiomegaly. Tortuous thoracic aorta. LEFT shoulder hemiarthroplasty. RIGHT shoulder osteoarthritis incidentally noted. There is no airspace disease, pneumothorax or displaced rib fractures identified non this frontal view of the chest. Subsegmental atelectasis and/ or  scarring is present in the mid chest bilaterally. IMPRESSION: 1. Cardiomegaly without failure. 2. Scarring or subsegmental atelectasis in the lungs bilaterally. Electronically Signed By: Dereck Ligas M.D. On: 06/12/2014 19:05   Dg Hip Complete Right  06/12/2014 CLINICAL DATA: Fall and complains of right hip pain. Pain goes down to the right knee. EXAM: RIGHT HIP - COMPLETE 2+ VIEW COMPARISON: None. FINDINGS: There is a displaced fracture of the proximal right femur. The distal fragment is medially displaced. There is at least a 1/2 shaft width of displacement. This is an oblique fracture which appears to involve the lesser trochanter and subtrochanteric region. Pelvic bony ring is intact. No gross abnormality to the left hip. Right hip is located. IMPRESSION: Displaced fracture of the proximal right femur. Electronically Signed By: Markus Daft M.D. On: 06/12/2014 19:06   Dg Femur Right  06/13/2014 CLINICAL DATA: Femur fracture, pain, initial encounter. EXAM: RIGHT FEMUR - 2 VIEW; DG C-ARM 61-120 MIN COMPARISON: 06/12/2014 radiographs FINDINGS: C-arm films document placement of an intramedullary rod with compression screw across a proximal femur fracture. Improved position and alignment. IMPRESSION: As above. Electronically Signed By: Rolla Flatten M.D. On: 06/13/2014 07:11   Ct Head Wo Contrast  06/12/2014 CLINICAL DATA: Fall. Anticoagulated patient. Head trauma. Initial encounter. EXAM: CT HEAD WITHOUT CONTRAST TECHNIQUE: Contiguous axial images were obtained from the base of the skull through the vertex without intravenous contrast. COMPARISON: None. FINDINGS: No mass lesion, mass effect, midline shift, hydrocephalus, hemorrhage. No acute territorial cortical ischemia/infarct. Atrophy and chronic ischemic white matter disease is present. Intracranial atherosclerosis. Paranasal sinuses are within normal limits. Bilateral lens extractions. IMPRESSION: Atrophy and  chronic ischemic white matter disease without acute intracranial abnormality. Electronically Signed By: Dereck Ligas M.D. On: 06/12/2014 18:20   Dg C-arm 1-60 Min  06/13/2014 CLINICAL DATA: Femur fracture, pain, initial encounter. EXAM: RIGHT FEMUR - 2 VIEW; DG C-ARM 61-120 MIN COMPARISON: 06/12/2014 radiographs FINDINGS: C-arm films document placement of an intramedullary rod with compression screw across a proximal femur fracture. Improved position and alignment. IMPRESSION: As above. Electronically Signed By: Rolla Flatten M.D. On: 06/13/2014 07:11     Assessment/Plan: Diagnosis: Right subtrochanteric fracture after fall on 06/12/2014 status post IM nail postop day #2 1. Does the need for close, 24 hr/day medical supervision in concert with the patient's rehab needs make it unreasonable for this patient to be served in a less intensive setting? Yes 2. Co-Morbidities requiring supervision/potential complications: History of PE,Pain control 3. Due to bladder management, bowel management, safety, skin/wound care, disease management, medication administration, pain management and patient education, does the patient require 24 hr/day rehab nursing? Yes 4. Does the patient require coordinated care of a physician, rehab nurse, PT (1-2 hrs/day, 5 days/week) and OT (1-2 hrs/day, 5 days/week) to address physical and functional deficits in the context of the above medical diagnosis(es)? Yes Addressing deficits  in the following areas: balance, endurance, locomotion, strength, transferring, bowel/bladder control, bathing, dressing, feeding, grooming and toileting 5. Can the patient actively participate in an intensive therapy program of at least 3 hrs of therapy per day at least 5 days per week? Yes 6. The potential for patient to make measurable gains while on inpatient rehab is good 7. Anticipated functional outcomes upon discharge from inpatient rehab are supervision with PT,  supervision with OT, n/a with SLP. 8. Estimated rehab length of stay to reach the above functional goals is: 12-16 days 9. Does the patient have adequate social supports to accommodate these discharge functional goals? Yes 10. Anticipated D/C setting: Home 11. Anticipated post D/C treatments: [place] therapy 12. Overall Rehab/Functional Prognosis: good  RECOMMENDATIONS: This patient's condition is appropriate for continued rehabilitative care in the following setting: CIR Patient has agreed to participate in recommended program. Yes Note that insurance prior authorization may be required for reimbursement for recommended care.  Comment:     06/14/2014

## 2014-06-16 NOTE — Progress Notes (Signed)
Received patient from [facility].  Patient and sister oriented to room and unit.  Vitals obtained; denies pain.  Will continue to monitor.

## 2014-06-16 NOTE — Progress Notes (Signed)
Physical Therapy Treatment Patient Details Name: Melissa Lambert MRN: [id number] DOB: [birth year] Today's Date: 06/16/2014    History of Present Illness Fall with femur fx, now s/p IM nail. PMHx:HTN, asthma, PE, protein deficiences    PT Comments    Pt is progressing well with her mobility. She was able to transfer and take steps today with two person assist and the RW.  She will be ready for short distance gait tomorrow.  She is also progressing well with her right leg exercises.  This pt is highly motivated to get back to independence and avoid SNF placement. The plan is to discharge to CIR later today.  PT will continue to follow acutely.   Follow Up Recommendations  CIR     Equipment Recommendations  None recommended by PT    Recommendations for Other Services   NA     Precautions / Restrictions Precautions Precautions: Fall Precaution Comments: h/o falls Restrictions RLE Weight Bearing: Weight bearing as tolerated    Mobility  Bed Mobility Overal bed mobility: Needs Assistance Bed Mobility: Supine to Sit     Supine to sit: Mod assist     General bed mobility comments: Mod assist to help progress right leg to EOB and to support trunk during transition to sitting.  Pt also needed help with bed pad to weight shift to scoot to EOB once sitting.  Pt using bed rail for leverage with upper body.   Transfers Overall transfer level: Needs assistance Equipment used: Rolling walker (2 wheeled) Transfers: Sit to/from [facility] Sit to Stand: +2 physical assistance;Mod assist Stand pivot transfers: +2 physical assistance;Mod assist       General transfer comment: Two person mod assist to stand with RW today and take several pivotal steps with RW to both the recliner chair, BSC and back to the recliner chair.  Pt needed assist at her trunk to power up over weak legs.  Once standing she needed support to help her weight shift and support trunk while stepping over  her painful right leg.          Balance Overall balance assessment: Needs assistance Sitting-balance support: Feet supported;No upper extremity supported Sitting balance-Leahy Scale: Fair Sitting balance - Comments: tendancy to posterior lean in sitting   Standing balance support: Bilateral upper extremity supported Standing balance-Leahy Scale: Poor                      Cognition Arousal/Alertness: Awake/alert Behavior During Therapy: WFL for tasks assessed/performed Overall Cognitive Status: Within Functional Limits for tasks assessed                      Exercises Total Joint Exercises Ankle Circles/Pumps: AROM;Both;20 reps;Seated Quad Sets: AROM;Right;10 reps;Seated Short Arc Quad: AROM;Right;10 reps;Seated Heel Slides: AAROM;Right;10 reps;Seated Hip ABduction/ADduction: AAROM;Right;10 reps;Seated Long Arc Quad: Right;10 reps;Seated;AROM        Pertinent Vitals/Pain Pain Assessment: Faces Faces Pain Scale: Hurts little more Pain Location: right leg Pain Descriptors / Indicators: Burning Pain Intervention(s): Limited activity within patient's tolerance    Home Living   Living Arrangements: Alone Available Help at Discharge: Family;Available 24 hours/day Type of Home: House Home Access: Level entry   Home Layout: Two level;Laundry or work area in basement;Able to live on main level with bedroom/bathroom            PT Goals (current goals can now be found in the care plan section) Acute Rehab PT Goals Patient Stated Goal:  to go home and avoid SNF Progress towards PT goals: Progressing toward goals    Frequency  Min 5X/week    PT Plan Current plan remains appropriate       End of Session Equipment Utilized During Treatment: Gait belt Activity Tolerance: Patient limited by pain;Patient limited by fatigue Patient left: in chair;with call bell/phone within reach;with family/visitor present     Time: 1113-1202 PT Time Calculation  (min): 49 min  Charges:  $Therapeutic Exercise: 8-22 mins $Therapeutic Activity: 23-37 mins                      Tomasita Beevers B. Aron Inge, PT, DPT [phone]   06/16/2014, 2:28 PM

## 2014-06-16 NOTE — Progress Notes (Signed)
Rehab admissions - Bed available on inpatient rehab today and will admit to rehab today.  Call me for questions.  #[phone]

## 2014-06-17 ENCOUNTER — Inpatient Hospital Stay (HOSPITAL_COMMUNITY): Payer: Medicare Other | Admitting: Occupational Therapy

## 2014-06-17 ENCOUNTER — Inpatient Hospital Stay (HOSPITAL_COMMUNITY): Payer: Medicare Other | Admitting: Rehabilitation

## 2014-06-17 DIAGNOSIS — S72001S Fracture of unspecified part of neck of right femur, sequela: Secondary | ICD-10-CM

## 2014-06-17 LAB — CBC WITH DIFFERENTIAL/PLATELET
BASOS ABS: 0 10*3/uL (ref 0.0–0.1)
Basophils Relative: 1 % (ref 0–1)
EOS PCT: 5 % (ref 0–5)
Eosinophils Absolute: 0.3 10*3/uL (ref 0.0–0.7)
HCT: 26.9 % — ABNORMAL LOW (ref 36.0–46.0)
Hemoglobin: 9.1 g/dL — ABNORMAL LOW (ref 12.0–15.0)
LYMPHS PCT: 41 % (ref 12–46)
Lymphs Abs: 2.7 10*3/uL (ref 0.7–4.0)
MCH: 30 pg (ref 26.0–34.0)
MCHC: 33.8 g/dL (ref 30.0–36.0)
MCV: 88.8 fL (ref 78.0–100.0)
Monocytes Absolute: 0.6 10*3/uL (ref 0.1–1.0)
Monocytes Relative: 9 % (ref 3–12)
NEUTROS PCT: 44 % (ref 43–77)
Neutro Abs: 2.9 10*3/uL (ref 1.7–7.7)
PLATELETS: 222 10*3/uL (ref 150–400)
RBC: 3.03 MIL/uL — ABNORMAL LOW (ref 3.87–5.11)
RDW: 13.6 % (ref 11.5–15.5)
WBC: 6.6 10*3/uL (ref 4.0–10.5)

## 2014-06-17 LAB — COMPREHENSIVE METABOLIC PANEL
ALT: 22 U/L (ref 0–35)
AST: 33 U/L (ref 0–37)
Albumin: 2.4 g/dL — ABNORMAL LOW (ref 3.5–5.2)
Alkaline Phosphatase: 60 U/L (ref 39–117)
Anion gap: 9 (ref 5–15)
BUN: 12 mg/dL (ref 6–23)
CALCIUM: 8.5 mg/dL (ref 8.4–10.5)
CO2: 28 mEq/L (ref 19–32)
Chloride: 94 mEq/L — ABNORMAL LOW (ref 96–112)
Creatinine, Ser: 0.62 mg/dL (ref 0.50–1.10)
GFR calc Af Amer: >90 mL/min (ref >90)
GFR calc non Af Amer: 81 mL/min — ABNORMAL LOW (ref >90)
Glucose, Bld: 116 mg/dL — ABNORMAL HIGH (ref 70–99)
Potassium: 4.1 mEq/L (ref 3.7–5.3)
Sodium: 131 mEq/L — ABNORMAL LOW (ref 137–147)
TOTAL PROTEIN: 5.8 g/dL — AB (ref 6.0–8.3)
Total Bilirubin: 0.7 mg/dL (ref 0.3–1.2)

## 2014-06-17 LAB — PROTIME-INR
INR: 2.25 — ABNORMAL HIGH (ref 0.00–1.49)
PROTHROMBIN TIME: 25.1 s — AB (ref 11.6–15.2)

## 2014-06-17 MED ORDER — WARFARIN SODIUM 7.5 MG PO TABS
7.5000 mg | ORAL_TABLET | Freq: Once | ORAL | Status: AC
  Administered 2014-06-17: 7.5 mg via ORAL
  Filled 2014-06-17: qty 1

## 2014-06-17 NOTE — Progress Notes (Signed)
Patient information reviewed and entered into e[facility] System by Becky Ellar Hakala, covering PPS coordinator. Information including medical coding and functional independence measure will be reviewed and updated through discharge.  Per nursing, patient was given "Data Collection Information Summary for Patients in Inpatient Rehabilitation Facilities with attached Privacy Act Statement Health Care Records" upon admission.     

## 2014-06-17 NOTE — Discharge Instructions (Addendum)
Information on my medicine - Coumadin   (Warfarin)  This medication education was reviewed with me or my healthcare representative as part of my discharge preparation.  The pharmacist that spoke with me during my hospital stay was:  Steffanie Dunn, PharmD  Why was Coumadin prescribed for you? Coumadin was prescribed for you because you have a blood clot or a medical condition that can cause an increased risk of forming blood clots. Blood clots can cause serious health problems by blocking the flow of blood to the heart, lung, or brain. Coumadin can prevent harmful blood clots from forming. As a reminder your indication for Coumadin is:   Pulmonary Embolism Treatment  What test will check on my response to Coumadin? While on Coumadin (warfarin) you will need to have an INR test regularly to ensure that your dose is keeping you in the desired range. The INR (international normalized ratio) number is calculated from the result of the laboratory test called prothrombin time (PT).  If an INR APPOINTMENT HAS NOT ALREADY BEEN MADE FOR YOU please schedule an appointment to have this lab work done by your health care provider within 7 days. Your INR goal is usually a number between:  2 to 3 or your provider may give you a more narrow range like 2-2.5.  Ask your health care provider during an office visit what your goal INR is.  What  do you need to  know  About  COUMADIN? Take Coumadin (warfarin) exactly as prescribed by your healthcare provider about the same time each day.  DO NOT stop taking without talking to the doctor who prescribed the medication.  Stopping without other blood clot prevention medication to take the place of Coumadin may increase your risk of developing a new clot or stroke.  Get refills before you run out.  What do you do if you miss a dose? If you miss a dose, take it as soon as you remember on the same day then continue your regularly scheduled regimen the next day.  Do not take two  doses of Coumadin at the same time.  Important Safety Information A possible side effect of Coumadin (Warfarin) is an increased risk of bleeding. You should call your healthcare provider right away if you experience any of the following: ? Bleeding from an injury or your nose that does not stop. ? Unusual colored urine (red or dark brown) or unusual colored stools (red or black). ? Unusual bruising for unknown reasons. ? A serious fall or if you hit your head (even if there is no bleeding).  Some foods or medicines interact with Coumadin (warfarin) and might alter your response to warfarin. To help avoid this: ? Eat a balanced diet, maintaining a consistent amount of Vitamin K. ? Notify your provider about major diet changes you plan to make. ? Avoid alcohol or limit your intake to 1 drink for women and 2 drinks for men per day. (1 drink is 5 oz. wine, 12 oz. beer, or 1.5 oz. liquor.)  Make sure that ANY health care provider who prescribes medication for you knows that you are taking Coumadin (warfarin).  Also make sure the healthcare provider who is monitoring your Coumadin knows when you have started a new medication including herbals and non-prescription products.  Coumadin (Warfarin)  Major Drug Interactions  Increased Warfarin Effect Decreased Warfarin Effect  Alcohol (large quantities) Antibiotics (esp. Septra/Bactrim, Flagyl, Cipro) Amiodarone (Cordarone) Aspirin (ASA) Cimetidine (Tagamet) Megestrol (Megace) NSAIDs (ibuprofen, naproxen, etc.) Piroxicam (Feldene)  Propafenone (Rythmol SR) Propranolol (Inderal) Isoniazid (INH) Posaconazole (Noxafil) Barbiturates (Phenobarbital) Carbamazepine (Tegretol) Chlordiazepoxide (Librium) Cholestyramine (Questran) Griseofulvin Oral Contraceptives Rifampin Sucralfate (Carafate) Vitamin K   Coumadin (Warfarin) Major Herbal Interactions  Increased Warfarin Effect Decreased Warfarin Effect  Garlic Ginseng Ginkgo biloba Coenzyme  Q10 Green tea St. Johns wort    Coumadin (Warfarin) FOOD Interactions  Eat a consistent number of servings per week of foods HIGH in Vitamin K (1 serving =  cup)  Collards (cooked, or boiled & drained) Kale (cooked, or boiled & drained) Mustard greens (cooked, or boiled & drained) Parsley *serving size only =  cup Spinach (cooked, or boiled & drained) Swiss chard (cooked, or boiled & drained) Turnip greens (cooked, or boiled & drained)  Eat a consistent number of servings per week of foods MEDIUM-HIGH in Vitamin K (1 serving = 1 cup)  Asparagus (cooked, or boiled & drained) Broccoli (cooked, boiled & drained, or raw & chopped) Brussel sprouts (cooked, or boiled & drained) *serving size only =  cup Lettuce, raw (green leaf, endive, romaine) Spinach, raw Turnip greens, raw & chopped   These websites have more information on Coumadin (warfarin):  FailFactory.se; VeganReport.com.au;   Inpatient Rehab Discharge Instructions  Melissa Lambert Discharge date and time: No discharge date for patient encounter.   Activities/Precautions/ Functional Status: Activity: activity as tolerated Diet: regular diet Wound Care: keep wound clean and dry Functional status:  ___ No restrictions     ___ Walk up steps independently ___ 24/7 supervision/assistance   ___ Walk up steps with assistance ___ Intermittent supervision/assistance  ___ Bathe/dress independently ___ Walk with walker     ___ Bathe/dress with assistance ___ Walk Independently    ___ Shower independently _x__ Walk with assistance    ___ Shower with assistance ___ No alcohol     ___ Return to work/school ________  Special Instructions: Home health nurse to check INR on 07/04/2014 results to Dr. Manuella Ghazi [phone] fax [place]:   PT, [place], [place] TKWIO:[phone] Date of last service:06/30/2014    Medical Equipment/Items  Ordered:SISTER TO GET TUB BENCH FOR PT    My questions have been answered and I understand these instructions. I will adhere to these goals and the provided educational materials after my discharge from the hospital.  Patient/Caregiver Signature _______________________________ Date __________  Clinician Signature _______________________________________ Date __________  Please bring this form and your medication list with you to all your follow-up doctor's appointments.

## 2014-06-17 NOTE — Progress Notes (Signed)
Occupational Therapy Assessment and Plan  Patient Details  Name: Melissa Lambert MRN: [id number] Date of Birth: [birth year]  OT Diagnosis: abnormal posture, acute pain, muscle weakness (generalized) and pain in joint Rehab Potential: Rehab Potential: Good (for stated goals) ELOS: 19-21 days   Today's Date: 06/17/2014 OT Individual [id number] OT Individual Time Calculation (min): 60 min and  80 min     Problem List:  Patient Active Problem List   Diagnosis Date Noted  . Hip fracture 06/16/2014  . Essential hypertension   . Shoulder joint contracture   . Protein C deficiency   . Protein S deficiency   . Femur fracture, right 06/12/2014  . History of pulmonary embolus (PE) 06/12/2014    Past Medical History:  Past Medical History  Diagnosis Date  . Hypertension   . Asthma   . PE (pulmonary embolism)   . Protein C deficiency   . Protein S deficiency    Past Surgical History:  Past Surgical History  Procedure Laterality Date  . Femur im nail Right 06/12/2014    Procedure: INTRAMEDULLARY (IM) NAIL FEMORAL;  Surgeon: Wylene Simmer, MD;  Location: [place];  Service: Orthopedics;  Laterality: Right;    Assessment & Plan Clinical Impression: Patient is a 80 y.o. year old female with history of protein C and protein S deficiency/pulmonary emboli maintained on chronic Coumadin. Patient lives alone and used a cane/walker prior to admission. Her sister lives next door and provides assistance as needed Admitted 06/12/2014 after mechanical fall at home landing on her right side without loss of consciousness.Cranial CT scan negative for acute changes. X-rays and imaging revealed right subtrochanteric femur fracture. Underwent open treatment of right subtrochanteric femur fracture with intramedullary nailing 06/13/2014 per Dr. Doran Durand. Weightbearing as tolerated right lower extremity. Hospital course pain management. Chronic Coumadin resumed. Acute blood loss anemia 9.4 and monitored.  Physical and occupational therapy evaluation completed 06/14/2014 with recommendations of physical medicine rehabilitation consult. Patient was admitted for a comprehensive rehab program. Patient transferred to CIR on 06/16/2014 .    Patient currently requires total with basic self-care skills secondary to muscle weakness, decreased cardiorespiratoy endurance and decreased oxygen support and decreased sitting balance, decreased standing balance, decreased postural control, decreased balance strategies and difficulty maintaining precautions.  Prior to hospitalization, patient could complete ADLs only with supervision - min A.  Patient will benefit from skilled intervention to decrease level of assist with basic self-care skills prior to discharge home with care partner.  Anticipate patient will require 24 hour supervision and minimal physical assistance and follow up home health.  OT - End of Session Activity Tolerance: Decreased this session Endurance Deficit: Yes Endurance Deficit Description: Pt appearing to be SOB during evaluation and requiring increased rest breaks OT Assessment Rehab Potential: Good (for stated goals) Barriers to Discharge:  (none noted) OT Patient demonstrates impairments in the following area(s): Balance;Motor;Pain;Safety OT Basic ADL's Functional Problem(s): Grooming;Bathing;Dressing;Toileting OT Advanced ADL's Functional Problem(s):  (n/a) OT Transfers Functional Problem(s): Toilet;Tub/Shower OT Additional Impairment(s): None OT Plan OT Intensity: Minimum of 1-2 x/day, 45 to 90 minutes OT Frequency: 5 out of 7 days OT Duration/Estimated Length of Stay: 19-21 days OT Treatment/Interventions: Balance/vestibular training;Discharge planning;Pain management;Self Care/advanced ADL retraining;Therapeutic Activities;UE/LE Coordination activities;Patient/family education;Functional mobility training;Therapeutic Exercise;UE/LE Strength taining/ROM;Psychosocial  support;DME/adaptive equipment instruction OT Self Feeding Anticipated Outcome(s): n/a OT Basic Self-Care Anticipated Outcome(s): Min A - supervision OT Toileting Anticipated Outcome(s): supervision OT Bathroom Transfers Anticipated Outcome(s): supervision OT Recommendation Recommendations for Other Services: Other (comment) (none)  Patient destination: Home Follow Up Recommendations: Home health OT;24 hour supervision/assistance Equipment Recommended: Tub/shower bench   Skilled Therapeutic Intervention Session 1: Upon entering the room, pt supine in bed with sister present. Pt with 3/10 c/o pain in R hip during session. OT educated pt on OT purpose, POC, and goals while in inpatient rehab with pt and caregiver verbalizing understanding. Pt performing B & D session seated on EOB for UB tasks and requiring supine for LB bathing and dressing secondary to requiring total A for tasks. Pt remains supine with food tray set up, bed alarm on, call bell within reach, and sister present in room upon exiting.   Session 2: Pt seated in recliner chair with 3/10 c/o pain in R hip during session. Pt agreeable to 3 sets of 20 chest pulls, lateral pull downs, bicep curls, and shoulder diagonals with level 2 theraband in order to increase endurance and B UE strength. OT focusing on pursed lip breathing during session with pt returning demonstration with verbal cues for proper technique. Pt requiring reminder to breath during therapeutic exercise as pt has tendency to hold breath. O2 stats remaining at 90-93 % with therapeutic exercise and on room air. OT notified RN of removal of O2 for trial. Pt required total A of 2 for stand pivot to [facility] chair as pt has severe posterior lean in standing and unable to come into upright posture. Pt requiring total A for clothing management. RN remained in room with pt while toileting.   OT Evaluation Precautions/Restrictions  Precautions Precautions: Fall Precaution Comments: h/o  falls Restrictions Weight Bearing Restrictions: Yes RLE Weight Bearing: Weight bearing as tolerated General Chart Reviewed: Yes Pain Pain Assessment Pain Type: Surgical pain Pain Location: Hip Pain Orientation: Right Pain Descriptors / Indicators: Aching Pain Onset: Gradual Patients Stated Pain Goal: 1 Pain Intervention(s): RN made aware Home Living/Prior Functioning Home Living Living Arrangements: Alone Available Help at Discharge: Family, Available 24 hours/day Type of Home: House Home Access: Level entry Home Layout: Two level, Laundry or work area in basement, Able to live on main level with bedroom/bathroom Alternate Level Stairs-Number of Steps: 14 stairs to basement Additional Comments: cane, RW, rollator, grab bars in shower  Lives With: Alone IADL History Homemaking Responsibilities: No Current License: No Occupation: Retired Type of Occupation: Therapist, nutritional Level of Independence: Needs assistance with homemaking, Requires assistive device for independence  Able to Take Stairs?: Yes Driving: No Vocation: Retired Comments: Amb with cane. Sleeps in lift chair. Vision/Perception  Vision- History Patient Visual Report: No change from baseline  Cognition Overall Cognitive Status: Within Functional Limits for tasks assessed Arousal/Alertness: Awake/alert Orientation Level: Oriented X4 Attention: Selective Selective Attention: Appears intact Memory: Impaired Memory Impairment: Decreased recall of new information Problem Solving: Impaired Problem Solving Impairment: Functional basic Safety/Judgment: Appears intact Sensation Sensation Light Touch: Appears Intact Stereognosis: Not tested Hot/Cold: Appears Intact Proprioception: Appears Intact Coordination Gross Motor Movements are Fluid and Coordinated: Yes Fine Motor Movements are Fluid and Coordinated: No Coordination and Movement Description: limited secondary to pain and strength Motor   Motor Motor: Abnormal postural alignment and control Motor - Skilled Clinical Observations: decreased strength in R LE,decreased functional mobility, poor postural control and balance with severe posterior lean in standing, and L lean in sitting to avoid weight shift onto  R hip Mobility  Bed Mobility Bed Mobility: Supine to Sit Supine to Sit: HOB elevated;With rails;3: Mod assist Supine to Sit Details: Verbal cues for sequencing;Verbal cues for technique;Verbal cues for precautions/safety;Manual  facilitation for weight shifting;Manual facilitation for placement Transfers Sit to Stand: 1: +1 Total assist;2: Max assist Sit to Stand Details: Verbal cues for sequencing;Verbal cues for technique;Verbal cues for precautions/safety;Manual facilitation for weight shifting;Manual facilitation for weight bearing;Manual facilitation for placement;Verbal cues for safe use of DME/AE Stand to Sit: 2: Max assist;1: +1 Total assist Stand to Sit Details (indicate cue type and reason): Verbal cues for sequencing;Verbal cues for technique;Verbal cues for precautions/safety;Manual facilitation for weight shifting;Manual facilitation for weight bearing  Trunk/Postural Assessment  Cervical Assessment Cervical Assessment: Exceptions to [facility] Cervical Strength Overall Cervical Strength Comments: Pt with very flexed cervical spine Thoracic Assessment Thoracic Assessment: Exceptions to [facility] Thoracic Strength Overall Thoracic Strength Comments: kyphotic posture Lumbar Assessment Lumbar Assessment: Exceptions to [facility] Lumbar Strength Overall Lumbar Strength Comments: posterior pelvic tilt Postural Control Postural Control: Deficits on evaluation Postural Limitations: severe posterior lean in standing.  Balance Balance Balance Assessed: Yes Static Sitting Balance Static Sitting - Balance Support: Feet supported;Bilateral upper extremity supported Static Sitting - Level of Assistance: 6: Modified independent  (Device/Increase time) Dynamic Sitting Balance Dynamic Sitting - Balance Support: Feet supported;Left upper extremity supported Dynamic Sitting - Level of Assistance: 4: Min assist;5: Stand by assistance Sitting balance - Comments: posterior lean and L lateral lean  Static Standing Balance Static Standing - Balance Support: During functional activity;Bilateral upper extremity supported Static Standing - Level of Assistance: 2: Max assist;1: +1 Total assist Dynamic Standing Balance Dynamic Standing - Balance Support: During functional activity;Bilateral upper extremity supported Dynamic Standing - Level of Assistance: 1: +1 Total assist Extremity/Trunk Assessment RUE Assessment RUE Assessment: Within Functional Limits LUE Assessment LUE Assessment: Within Functional Limits  FIM:  FIM - Eating Eating Activity: 6: More than reasonable amount of time FIM - Grooming Grooming Steps: Wash, rinse, dry face;Wash, rinse, dry hands;Oral care, brush teeth, clean dentures;Brush, comb hair Grooming: 5: Set-up assist to obtain items FIM - Bathing Bathing Steps Patient Completed: Chest;Right Arm;Left Arm;Abdomen;Right upper leg;Left upper leg Bathing: 3: Mod-Patient completes 5-7 [id number] parts or 50-74% FIM - Upper Body Dressing/Undressing Upper body dressing/undressing steps patient completed: Thread/unthread right bra strap;Thread/unthread left bra strap;Thread/unthread right sleeve of pullover shirt/dresss;Thread/unthread left sleeve of pullover shirt/dress Upper body dressing/undressing: 3: Mod-Patient completed 50-74% of tasks FIM - Lower Body Dressing/Undressing Lower body dressing/undressing: 1: Two helpers FIM - Toileting Toileting steps completed by patient: Adjust clothing prior to toileting;Performs perineal hygiene;Adjust clothing after toileting Toileting: 1: Two helpers FIM - TRadio producerDevices: BNurse, learning disabilityTransfers: 1-Two  helpers FIM - TCamera operatorTransfers: 0-Activity did not occur or was simulated   Refer to Care Plan for Long Term Goals  Recommendations for other services: None  Discharge Criteria: Patient will be discharged from OT if patient refuses treatment 3 consecutive times without medical reason, if treatment goals not met, if there is a change in medical status, if patient makes no progress towards goals or if patient is discharged from hospital.  The above assessment, treatment plan, treatment alternatives and goals were discussed and mutually agreed upon: by patient and by family  PPhineas Semen11/01/2014, 5:30 PM

## 2014-06-17 NOTE — Evaluation (Signed)
Physical Therapy Assessment and Plan  Patient Details  Name: Melissa Lambert MRN: [id number] Date of Birth: June 13, 1929  PT Diagnosis: Abnormal posture, Abnormality of gait, Difficulty walking and Pain in R hip Rehab Potential: Good ELOS: 19-21 days    Today's Date: 06/17/2014 PT Individual Time: 1100-1200 PT Individual Time Calculation (min): 60 min    Problem List:  Patient Active Problem List   Diagnosis Date Noted  . Hip fracture 06/16/2014  . Essential hypertension   . Shoulder joint contracture   . Protein C deficiency   . Protein S deficiency   . Femur fracture, right 06/12/2014  . History of pulmonary embolus (PE) 06/12/2014    Past Medical History:  Past Medical History  Diagnosis Date  . Hypertension   . Asthma   . PE (pulmonary embolism)   . Protein C deficiency   . Protein S deficiency    Past Surgical History:  Past Surgical History  Procedure Laterality Date  . Femur im nail Right 06/12/2014    Procedure: INTRAMEDULLARY (IM) NAIL FEMORAL;  Surgeon: Wylene Simmer, MD;  Location: [facility];  Service: Orthopedics;  Laterality: Right;    Assessment & Plan Clinical Impression: Patient is a 80 y.o. year old female with history of protein C and protein S deficiency/pulmonary emboli maintained on chronic Coumadin. Patient lives alone and used a cane/walker prior to admission. Her sister lives next door and provides assistance as needed Admitted 06/12/2014 after mechanical fall at home landing on her right side without loss of consciousness.Cranial CT scan negative for acute changes. X-rays and imaging revealed right subtrochanteric femur fracture. Underwent open treatment of right subtrochanteric femur fracture with intramedullary nailing 06/13/2014 per Dr. Doran Durand. Weightbearing as tolerated right lower extremity. Hospital course pain management. Chronic Coumadin resumed.  Patient transferred to CIR on 06/16/2014 .   Patient currently requires max with mobility and +2 for gait  secondary to muscle weakness and decreased cardiorespiratoy endurance and pain in R hip.  Prior to hospitalization, patient was supervision with mobility and lived with Alone in a House home.  Home access is  Level entry.  Patient will benefit from skilled PT intervention to maximize safe functional mobility, minimize fall risk and decrease caregiver burden for planned discharge home with 24 hour supervision.  Anticipate patient will benefit from follow up [place] at discharge.  PT - End of Session Activity Tolerance: Tolerates 30+ min activity with multiple rests Endurance Deficit: Yes Endurance Deficit Description: Pt very SOB at times during upright mobility with several rest breaks during session.  PT Assessment Rehab Potential: Good Barriers to Discharge: Decreased caregiver support (has to be S level) PT Patient demonstrates impairments in the following area(s): Balance;Endurance;Motor;Pain;Safety PT Transfers Functional Problem(s): Bed Mobility;Bed to Chair;Car;Furniture PT Locomotion Functional Problem(s): Ambulation;Wheelchair Mobility PT Plan PT Intensity: Minimum of 1-2 x/day ,45 to 90 minutes PT Frequency: 5 out of 7 days PT Duration Estimated Length of Stay: 19-21 days  PT Treatment/Interventions: Ambulation/gait training;Balance/vestibular training;Discharge planning;DME/adaptive equipment instruction;Functional mobility training;Pain management;Patient/family education;Psychosocial support;Therapeutic Activities;Therapeutic Exercise;UE/LE Strength taining/ROM;Wheelchair propulsion/positioning PT Transfers Anticipated Outcome(s): S overall (min A for car transfer due to height) PT Locomotion Anticipated Outcome(s): S overall PT Recommendation Follow Up Recommendations: Home health PT;24 hour supervision/assistance Equipment Recommended: None recommended by PT  Skilled Therapeutic Intervention PT assessment and evaluation completed, see full details below.  Initiated gait training  with RW and stand pivot transfers with RW.  Note she is extremely limited due to body habitus and increased pain in RLE.  Pt  currently requires up to total to +2 assist.  Discussed ELOS being approx 3 weeks due to sister is not able to physically assist.  Also discussed equipment and rehab schedule.  Both pt and sister verbalize understanding.  Pt left in recliner with all needs in reach with sister present.    PT Evaluation Precautions/Restrictions Precautions Precautions: Fall Precaution Comments: h/o falls Restrictions Weight Bearing Restrictions: Yes RLE Weight Bearing: Weight bearing as tolerated General Chart Reviewed: Yes Family/Caregiver Present: Yes Vital Signs Pain Pain Assessment Pain Assessment: 0-10 Pain Score: 5  Pain Type: Surgical pain Pain Location: Hip Pain Orientation: Right Pain Descriptors / Indicators: Aching;Throbbing Pain Intervention(s): RN made aware Home Living/Prior Functioning Home Living Living Arrangements: Alone Available Help at Discharge: Family;Available 24 hours/day (sister) Type of Home: House Home Access: Level entry Home Layout: Two level;Laundry or work area in basement;Able to live on main level with bedroom/bathroom Additional Comments: Has a can and a RW and rollator, grab bars in tub/shower, uses shower to push up from toilet  Lives With: Alone Prior Function Level of Independence: Needs assistance with homemaking;Requires assistive device for independence  Able to Take Stairs?: Yes (was taking stairs outside) Driving: No Vocation: Retired Comments: Amb with cane. Sleeps in lift chair. Vision/Perception   See OT note Cognition Overall Cognitive Status: Within Functional Limits for tasks assessed Arousal/Alertness: Awake/alert Orientation Level: Oriented X4 Attention: Selective Selective Attention: Appears intact Memory: Impaired Memory Impairment: Decreased recall of new information Problem Solving: Impaired Problem Solving  Impairment: Functional basic Safety/Judgment: Appears intact Comments: Pt had to be cued each time for standing for technique and safe hand placement Sensation Sensation Light Touch: Appears Intact Stereognosis: Not tested Hot/Cold: Not tested Proprioception: Appears Intact Coordination Gross Motor Movements are Fluid and Coordinated: Yes Fine Motor Movements are Fluid and Coordinated: No Coordination and Movement Description: Pt greatly limited by decreased strength and increased pain in RLE Motor  Motor Motor: Abnormal postural alignment and control Motor - Skilled Clinical Observations: Pt with overall decreased mobility due to hip sx, also poor postural control and balance with severe posterior lean in standing.   Mobility Bed Mobility Bed Mobility: Supine to Sit Supine to Sit: HOB elevated;With rails;3: Mod assist Supine to Sit Details: Verbal cues for sequencing;Verbal cues for technique;Verbal cues for precautions/safety;Manual facilitation for weight shifting;Manual facilitation for placement Transfers Transfers: Yes Sit to Stand: 1: +1 Total assist;2: Max assist Sit to Stand Details: Verbal cues for sequencing;Verbal cues for technique;Verbal cues for precautions/safety;Manual facilitation for weight shifting;Manual facilitation for weight bearing;Manual facilitation for placement;Verbal cues for safe use of DME/AE Stand to Sit: 2: Max assist;1: +1 Total assist Stand to Sit Details (indicate cue type and reason): Verbal cues for sequencing;Verbal cues for technique;Verbal cues for precautions/safety;Manual facilitation for weight shifting;Manual facilitation for weight bearing Stand Pivot Transfers: 2: Max assist Stand Pivot Transfer Details: Verbal cues for sequencing;Verbal cues for technique;Verbal cues for precautions/safety;Manual facilitation for weight shifting;Verbal cues for safe use of DME/AE;Verbal cues for gait pattern;Manual facilitation for weight bearing Stand  Pivot Transfer Details (indicate cue type and reason): Pt with very heavy posterior lean in standing requiring up to total A to get into standing, once standing requires max assist for transfer with cues for upright posture and stepping sequence.  Locomotion  Ambulation Ambulation: Yes Ambulation/Gait Assistance: 1: +2 Total assist Ambulation Distance (Feet): 12 Feet Assistive device: Rolling walker Gait Gait: Yes Gait Pattern: Impaired Gait Pattern: Decreased stance time - right;Decreased step length - left;Decreased stride length;Shuffle;Trendelenburg;Antalgic;Wide  base of support;Decreased weight shift to right Stairs / Additional Locomotion Stairs: No (unsafe at this time) Architect: Yes Wheelchair Assistance: 4: Advertising account executive Details: Tactile cues for initiation;Verbal cues for sequencing;Verbal cues for technique;Verbal cues for Information systems manager: Both upper extremities Wheelchair Parts Management: Needs assistance Distance: 30  Trunk/Postural Assessment  Cervical Assessment Cervical Assessment: Exceptions to [facility] Cervical Strength Overall Cervical Strength Comments: Pt with very flexed cervical spine Thoracic Assessment Thoracic Assessment: Exceptions to [facility] Thoracic Strength Overall Thoracic Strength Comments: Pt demonstrates very kyphotic posture during sitting and standing.  Lumbar Assessment Lumbar Assessment: Exceptions to [facility] Lumbar Strength Overall Lumbar Strength Comments: Pt with sits and stands with posterior pelvic tilt Postural Control Postural Control: Deficits on evaluation Postural Limitations: Pt with severe posterior lean in standing  Balance Balance Balance Assessed: Yes Static Sitting Balance Static Sitting - Balance Support: Feet supported;Bilateral upper extremity supported Static Sitting - Level of Assistance: 6: Modified independent (Device/Increase time) Dynamic Sitting  Balance Dynamic Sitting - Balance Support: Feet supported;Left upper extremity supported Dynamic Sitting - Level of Assistance: 4: Min assist;5: Stand by assistance Sitting balance - Comments: tendancy to posterior lean in sitting Static Standing Balance Static Standing - Balance Support: During functional activity;Bilateral upper extremity supported Static Standing - Level of Assistance: 2: Max assist Dynamic Standing Balance Dynamic Standing - Balance Support: During functional activity;Bilateral upper extremity supported Dynamic Standing - Level of Assistance: 2: Max assist;1: +1 Total assist Extremity Assessment      RLE Assessment RLE Assessment: Exceptions to [facility] RLE Strength RLE Overall Strength: Deficits;Due to pain RLE Overall Strength Comments: Pt with very limited hip flex due to pain and body habitus (grossly 2/5), knee flex and ext grossly 3/5, ankle motions 4/5 LLE Assessment LLE Assessment: Within Functional Limits (grossly 3 to 4/5 throughout)  FIM:  FIM - Bed/Chair Transfer Bed/Chair Transfer Assistive Devices: Adult nurse Transfer: 3: Supine > Sit: Mod A (lifting assist/Pt. 50-74%/lift 2 legs;2: Bed > Chair or W/C: Max A (lift and lower assist) FIM - Locomotion: Wheelchair Distance: 30 Locomotion: Wheelchair: 2: Travels 50 - 149 ft with minimal assistance (Pt.>75%) FIM - Locomotion: Ambulation Locomotion: Ambulation Assistive Devices: Administrator Ambulation/Gait Assistance: 1: +2 Total assist Locomotion: Ambulation: 1: Two helpers FIM - Locomotion: Stairs Locomotion: Stairs: 0: Activity did not occur (did not attempt due to safety)   Refer to Care Plan for Long Term Goals  Recommendations for other services: None  Discharge Criteria: Patient will be discharged from PT if patient refuses treatment 3 consecutive times without medical reason, if treatment goals not met, if there is a change in medical status, if patient makes no progress towards goals  or if patient is discharged from hospital.  The above assessment, treatment plan, treatment alternatives and goals were discussed and mutually agreed upon: by patient and by family  Denice Bors 06/17/2014, 12:59 PM

## 2014-06-17 NOTE — Plan of Care (Signed)
Problem: RH PAIN MANAGEMENT Goal: RH STG PAIN MANAGED AT OR BELOW PT'S PAIN GOAL Outcome: Progressing Pain less than 4

## 2014-06-17 NOTE — Progress Notes (Signed)
Social Work Assessment and Plan Social Work Assessment and Plan  Patient Details  Name: Melissa Lambert MRN: [id number] Date of Birth: 07-12-1929  Today's Date: 06/17/2014  Problem List:  Patient Active Problem List   Diagnosis Date Noted  . Hip fracture 06/16/2014  . Essential hypertension   . Shoulder joint contracture   . Protein C deficiency   . Protein S deficiency   . Femur fracture, right 06/12/2014  . History of pulmonary embolus (PE) 06/12/2014   Past Medical History:  Past Medical History  Diagnosis Date  . Hypertension   . Asthma   . PE (pulmonary embolism)   . Protein C deficiency   . Protein S deficiency    Past Surgical History:  Past Surgical History  Procedure Laterality Date  . Femur im nail Right 06/12/2014    Procedure: INTRAMEDULLARY (IM) NAIL FEMORAL;  Surgeon: Wylene Simmer, MD;  Location: [facility];  Service: Orthopedics;  Laterality: Right;   Social History:  reports that she has never smoked. She does not have any smokeless tobacco history on file. She reports that she does not drink alcohol or use illicit drugs.  Family / Support Systems Marital Status: Single Patient Roles: Other (Comment), Volunteer (Sibling) Other Supports: Melissa Lambert-sister  [phone] Anticipated Caregiver: Sister Ability/Limitations of Caregiver: Sister is elderly but in good shape for her age Caregiver Availability: 24/7 Family Dynamics: Pt has four siblings three of which are sister's.  They have always been there for one another and Melissa Bers will stay with her until no longer needed.  Pt has never been married or has any children.  Social History Preferred language: English Religion: Baptist Cultural Background: No issues Education: Some Data processing manager: Yes Write: Yes Employment Status: Retired Freight forwarder Issues: No issues Guardian/Conservator: None-according to MD pt is capable of making her own decisions while here   Abuse/Neglect Physical  Abuse: Denies Verbal Abuse: Denies Sexual Abuse: Denies Exploitation of patient/patient's resources: Denies Self-Neglect: Denies  Emotional Status Pt's affect, behavior adn adjustment status: Pt is sleepy today but ready to do therapy.  She reports she has always been independent and wants to remain so.  Sister reports she will get there eventually but aware it will take time to heal.  Sister plans to be here to observe in therapies. Recent Psychosocial Issues: Other medical issues-has shoulder issues from arthritis and surgery years ago Pyschiatric History: No history deferred depression screen due to sleepy and not completely alert.  Will come back and reassess.  Sister feels she is doing well with all of this and it will take time to heal. Substance Abuse History: No issues  Patient / Family Perceptions, Expectations & Goals Pt/Family understanding of illness & functional limitations: Pt and sister can explain her surgery and WB issues.  The main issue is her pain and controlling this while here.  Both have spoken with MD and feel their questions have been answered and addressed. Premorbid pt/family roles/activities: Sister, Retiree, Child psychotherapist, Health visitor, etc Anticipated changes in roles/activities/participation: resume Pt/family expectations/goals: Pt states; " I will get there but I first have to wake up and get this pain under control."  Sister states: " I will stay with her until she doesn't need me too."  [employer]: None Premorbid Home Care/DME Agencies: Other (Comment) (Had in past) Transportation available at discharge: Family members  Discharge Planning Living Arrangements: [place]: Other relatives, Water engineer, Social worker community Type of Residence: Private residence Insurance Resources: [employer], Multimedia programmer (specify) (  AARP) Financial Resources: Radio broadcast assistant Screen Referred: No Living Expenses:  Own Money Management: Patient Does the patient have any problems obtaining your medications?: No Home Management: Self Patient/Family Preliminary Plans: Return home with sister staying with here to assist with her care.  She has other siblings that will also help if necessary.  Await team's evaluations and discuss a safe discharge plan.  Sister plans to be here and participate in her therapies. Social Work Anticipated Follow Up Needs: HH/OP  Clinical Impression Pleasant female who is willing to do her part to progress in therapies.  She has a supportive sister who plans to be involved and stay with her until not needed. Will await team's evaluations and discuss a discharge plan.  Melissa Lambert 06/17/2014, 11:30 AM

## 2014-06-17 NOTE — Progress Notes (Signed)
ANTICOAGULATION CONSULT NOTE - Follow Up Consult  Pharmacy Consult for coumadin Indication: hx of PE  Allergies  Allergen Reactions  . Adhesive [Tape] Itching  . Penicillins Itching, Swelling and Rash    Streptomycin also  . Tuberculin Tests Itching, Swelling and Rash    Patient Measurements: Height: 5\' 1"  (154.9 cm) Weight: 202 lb 13.2 oz (92 kg) IBW/kg (Calculated) : 47.8 Heparin Dosing Weight:   Vital Signs: Temp: 98.5 F (36.9 C) (11/06 0527) Temp Source: Oral (11/06 0527) BP: 125/58 mmHg (11/06 0527) Pulse Rate: 75 (11/06 0527)  Labs:  Recent Labs  06/15/14 0613 06/16/14 0433 06/17/14 0552  HGB 9.1*  --  9.1*  HCT 27.0*  --  26.9*  PLT 168  --  222  LABPROT 27.8* 27.5* 25.1*  INR 2.56* 2.53* 2.25*  CREATININE 0.64  --  0.62    Estimated Creatinine Clearance: 55.1 mL/min (by C-G formula based on Cr of 0.62).   Medications:  Scheduled:  . calcium-vitamin D  1 tablet Oral BID  . docusate sodium  100 mg Oral BID  . loratadine  10 mg Oral Daily  . oxybutynin  5 mg Oral Daily  . pantoprazole  40 mg Oral Daily  . senna  2 tablet Oral BID  . Warfarin - Pharmacist Dosing Inpatient   Does not apply q1800   Infusions:    Assessment: 80 yo female with hx of PE is currently on therapeutic coumadin.  INR today is 2.25.  Goal of Therapy:  INR 2-3 Monitor platelets by anticoagulation protocol: Yes   Plan:  - coumadin 7.5 mg po x1 - INR in am  Shahrukh Pasch, Tsz-Yin 06/17/2014,8:20 AM

## 2014-06-17 NOTE — Care Management Note (Signed)
[facility] Individual Statement of Services  Patient Name:  Melissa Lambert  Date:  06/17/2014  Welcome to the [facility].  Our goal is to provide you with an individualized program based on your diagnosis and situation, designed to meet your specific needs.  With this comprehensive rehabilitation program, you will be expected to participate in at least 3 hours of rehabilitation therapies Monday-Friday, with modified therapy programming on the weekends.  Your rehabilitation program will include the following services:  Physical Therapy (PT), Occupational Therapy (OT), 24 hour per day rehabilitation nursing, Case Management (Social Worker), Rehabilitation Medicine, Nutrition Services and Pharmacy Services  Weekly team conferences will be held on Wednesday to discuss your progress.  Your Social Worker will talk with you frequently to get your input and to update you on team discussions.  Team conferences with you and your family in attendance may also be held.  Expected length of stay: 17-20 days  Overall anticipated outcome: supervision/min level  Depending on your progress and recovery, your program may change. Your Social Worker will coordinate services and will keep you informed of any changes. Your Social Worker's name and contact numbers are listed  below.  The following services may also be recommended but are not provided by the [facility] will be made to provide these services after discharge if needed.  Arrangements include referral to agencies that provide these services.  Your insurance has been verified to be:  Medicare & [place] Your primary doctor is:  Dr Monico Blitz  Pertinent information will be shared with your doctor and your insurance company.  Social Worker:  Ovidio Kin, [place] or ([phone]  Information discussed with and copy given to patient by: [name], 06/17/2014, 11:14 AM

## 2014-06-17 NOTE — Care Management Note (Signed)
CARE MANAGEMENT NOTE 06/17/2014  Patient:  Melissa Lambert, Melissa Lambert   Account Number:  [id number]  Date Initiated:  06/16/2014  Documentation initiated by:  Ricki Miller  Subjective/Objective Assessment:   80 yr old demale admitted s/p fall with a right hip fracture. Patient had a right hip IM Nailing.     Action/Plan:   Patient will go to Inpatient rehab for therapy.   Anticipated DC Date:  06/16/2014   Anticipated DC Plan:  IP REHAB FACILITY  In-house referral  Clinical Social Worker      DC Planning Services  CM consult      [facility] Choice  NA   Choice offered to / List presented to:     DME arranged  NA        [place] arranged  NA      Status of service:  Completed, signed off Medicare Important Message given?   (If response is "NO", the following Medicare IM given date fields will be blank) Date Medicare IM given:   Medicare IM given by:   Date Additional Medicare IM given:   Additional Medicare IM given by:    Discharge Disposition:  IP REHAB FACILITY  Per UR Regulation:  Reviewed for med. necessity/level of care/duration of stay  If discussed at [place] of Stay Meetings, dates discussed:    Comments:

## 2014-06-17 NOTE — IPOC Note (Signed)
Overall Plan of Care [facility]) Patient Details Name: Melissa Lambert MRN: [id number] DOB: February 15, 1929  Admitting Diagnosis: subtrachanteric femur fx  Hospital Problems: Active Problems:   Hip fracture   Shoulder joint contracture   Protein C deficiency   Protein S deficiency     Functional Problem List: Nursing Bladder, Bowel, Edema, Endurance, Motor, Pain, Safety, Skin Integrity  PT Balance, Endurance, Motor, Pain, Safety  OT Balance, Motor, Pain, Safety  SLP    TR         Basic ADL's: OT Grooming, Bathing, Dressing, Toileting     Advanced  ADL's: OT  (n/a)     Transfers: PT Bed Mobility, Bed to Chair, Car, Manufacturing systems engineer, Metallurgist: PT Ambulation, Emergency planning/management officer     Additional Impairments: OT None  SLP        TR      Anticipated Outcomes Item Anticipated Outcome  Self Feeding n/a  Swallowing      Basic self-care  Min A - supervision  Toileting  supervision   Bathroom Transfers supervision  Bowel/Bladder  Mod I of bladder with briefs, continent of bowel  Transfers  S overall (min A for car transfer due to height)  Locomotion  S overall  Communication     Cognition     Pain  </=3  Safety/Judgment  No falls with injury   Therapy Plan: PT Intensity: Minimum of 1-2 x/day ,45 to 90 minutes PT Frequency: 5 out of 7 days PT Duration Estimated Length of Stay: 19-21 days  OT Intensity: Minimum of 1-2 x/day, 45 to 90 minutes OT Frequency: 5 out of 7 days OT Duration/Estimated Length of Stay: 19-21 days         Team Interventions: Nursing Interventions Patient/Family Education, Bladder Management, Bowel Management, Pain Management, Skin Care/Wound Management, Psychosocial Support  PT interventions Ambulation/gait training, Balance/vestibular training, Discharge planning, DME/adaptive equipment instruction, Functional mobility training, Pain management, Patient/family education, Psychosocial support, Therapeutic Activities,  Therapeutic Exercise, UE/LE Strength taining/ROM, Wheelchair propulsion/positioning  OT Interventions Balance/vestibular training, Discharge planning, Pain management, Self Care/advanced ADL retraining, Therapeutic Activities, UE/LE Coordination activities, Patient/family education, Functional mobility training, Therapeutic Exercise, UE/LE Strength taining/ROM, Psychosocial support, DME/adaptive equipment instruction  SLP Interventions    TR Interventions    SW/CM Interventions Discharge Planning, Psychosocial Support, Patient/Family Education    Team Discharge Planning: Destination: PT-  ,OT- Home , SLP-  Projected Follow-up: PT-Home health PT, 24 hour supervision/assistance, OT-  Home health OT, 24 hour supervision/assistance, SLP-  Projected Equipment Needs: PT-None recommended by PT, OT- Tub/shower bench, SLP-  Equipment Details: PT- , OT-  Patient/family involved in discharge planning: PT- Patient, Family member/caregiver,  OT-Patient, Family member/caregiver, SLP-   MD ELOS: 19-20 days Medical Rehab Prognosis:  Good Assessment: The patient has been admitted for CIR therapies with the diagnosis of subtrochanteric hip fracture. The team will be addressing functional mobility, strength, stamina, balance, safety, adaptive techniques and equipment, self-care, bowel and bladder mgt, patient and caregiver education, pain mgt, ortho precautions, wound care, leisure awareness, ego support. Goals have been set at supervision to min assist for self-care and basic mobility. Meredith Staggers, MD, FAAPMR      See Team Conference Notes for weekly updates to the plan of care

## 2014-06-17 NOTE — Progress Notes (Signed)
[place] PHYSICAL MEDICINE & REHABILITATION     PROGRESS NOTE    Subjective/Complaints: Up with therapies this am. Sitting eob. Right hip remains tender  Objective: Vital Signs: Blood pressure 125/58, pulse 75, temperature 98.5 F (36.9 C), temperature source Oral, resp. rate 18, height 5\' 1"  (1.549 m), weight 92 kg (202 lb 13.2 oz), SpO2 93 %. No results found.  Recent Labs  06/15/14 0613 06/17/14 0552  WBC 6.5 6.6  HGB 9.1* 9.1*  HCT 27.0* 26.9*  PLT 168 222    Recent Labs  06/15/14 0613 06/17/14 0552  NA 131* 131*  K 4.4 4.1  CL 98 94*  GLUCOSE 122* 116*  BUN 12 12  CREATININE 0.64 0.62  CALCIUM 8.2* 8.5   CBG (last 3)  No results for input(s): GLUCAP in the last 72 hours.  Wt Readings from Last 3 Encounters:  06/16/14 92 kg (202 lb 13.2 oz)  06/12/14 89.812 kg (198 lb)    Physical Exam:  Constitutional: obese, no distress HENT: oral mucosa pink Head: Normocephalic.  Eyes: EOM are normal.  Neck: Normal range of motion. Neck supple. No thyromegaly present.  Cardiovascular: Normal rate and regular rhythm.  Respiratory: Effort normal and breath sounds normal. No respiratory distress.  GI: Soft. Bowel sounds are normal. She exhibits no distension.  Skin:  Right hip surgical site dressed/tender M/s: right shoulder ROM limited Neuro: alert and oriented. hoh 3 minus left deltoid, 4/5 right deltoid, 4/5 bilateral biceps triceps and grip 2- right hip flexion 3- knee extension for ankle dorsiflexion plantar flexion 4/5 in the left hip flexor and extensor ankle dorsi flexors and plantar fldexor Sensory intact to light touch bilateral lower extremity Psych: pleasant and appropriate  Assessment/Plan: 1. Functional deficits secondary to right subtrochanteric femur fracture which require 3+ hours per day of interdisciplinary therapy in a comprehensive inpatient rehab setting. Physiatrist is providing close team supervision and 24 hour management of  active medical problems listed below. Physiatrist and rehab team continue to assess barriers to discharge/monitor patient progress toward functional and medical goals. FIM:                   Comprehension Comprehension Mode: Auditory Comprehension: 5-Follows basic conversation/direction: With extra time/assistive device  Expression Expression Mode: Verbal Expression: 5-Expresses basic 90% of the time/requires cueing < 10% of the time.  Social Interaction Social Interaction: 5-Interacts appropriately 90% of the time - Needs monitoring or encouragement for participation or interaction.  Problem Solving Problem Solving: 4-Solves basic 75 - 89% of the time/requires cueing 10 - 24% of the time  Memory Memory: 4-Recognizes or recalls 75 - 89% of the time/requires cueing 10 - 24% of the time  Medical Problem List and Plan: 1. Functional deficits secondary to right subtrochanteric femur fracture. Status post IM nailing 06/13/2014. Weightbearing as tolerated 2. 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   3. Pain Management: Hydrocodone as needed. Monitor with increased mobility 4. Acute blood loss anemia. Follow-up CBC 5. Neuropsych: This patient is capable of making decisions on her own behalf. 6. Skin/Wound Care: routine skin checks 7. Fluids/Electrolytes/Nutrition: follow-up chemistries appropriate today. Strict I and O's 8.Hypertension. No present antihypertensive medication. Patient on Norvasc 2.5 mg twice a day prior to admission and Diovan 320-25 milligrams daily.Monitor with increased mobility and resume antihypertensive medications as needed 9.Overactive Bladder. Ditropan 5 mg daily. Checking pvr's 10.GERD. Protonix 11. Left shoulder contractures secondary to history of severe osteoarthritis and total shoulder arthroplasty  LOS (Days) 1 A FACE TO FACE EVALUATION WAS PERFORMED  Jood Retana T 06/17/2014  7:46 AM

## 2014-06-18 ENCOUNTER — Inpatient Hospital Stay (HOSPITAL_COMMUNITY): Payer: Medicare Other | Admitting: Physical Therapy

## 2014-06-18 ENCOUNTER — Inpatient Hospital Stay (HOSPITAL_COMMUNITY): Payer: Medicare Other | Admitting: Occupational Therapy

## 2014-06-18 DIAGNOSIS — F411 Generalized anxiety disorder: Secondary | ICD-10-CM

## 2014-06-18 DIAGNOSIS — K219 Gastro-esophageal reflux disease without esophagitis: Secondary | ICD-10-CM

## 2014-06-18 LAB — PROTIME-INR
INR: 2.26 — ABNORMAL HIGH (ref 0.00–1.49)
PROTHROMBIN TIME: 25.2 s — AB (ref 11.6–15.2)

## 2014-06-18 MED ORDER — WARFARIN SODIUM 10 MG PO TABS
10.0000 mg | ORAL_TABLET | Freq: Once | ORAL | Status: AC
  Administered 2014-06-18: 10 mg via ORAL
  Filled 2014-06-18: qty 1

## 2014-06-18 NOTE — Progress Notes (Signed)
ANTICOAGULATION CONSULT NOTE - FOLLOW UP  Pharmacy Consult:  Coumadin Indication:  History of PE and Protein C/S deficiencies  Allergies  Allergen Reactions  . Adhesive [Tape] Itching  . Penicillins Itching, Swelling and Rash    Streptomycin also  . Tuberculin Tests Itching, Swelling and Rash    Patient Measurements: Height: 5\' 1"  (154.9 cm) Weight: 202 lb 13.2 oz (92 kg) IBW/kg (Calculated) : 47.8  Vital Signs: Temp: 98 F (36.7 C) (11/07 0600) Temp Source: Oral (11/07 0600) BP: 142/65 mmHg (11/07 0600) Pulse Rate: 67 (11/07 0600)  Labs:  Recent Labs  06/16/14 0433 06/17/14 0552 06/18/14 0409  HGB  --  9.1*  --   HCT  --  26.9*  --   PLT  --  222  --   LABPROT 27.5* 25.1* 25.2*  INR 2.53* 2.25* 2.26*  CREATININE  --  0.62  --     Estimated Creatinine Clearance: 55.1 mL/min (by C-G formula based on Cr of 0.62).    Assessment: 23 YOF continues on Coumadin for history of PE and protein C and S deficiencies.  INR therapeutic; no bleeding reported.   Goal of Therapy:  INR 2-3    Plan:  - Coumadin 10mg  PO today - Daily PT / INR    Hayden Mabin D. Mina Marble, PharmD, BCPS Pager:  [phone] 06/18/2014, 1:03 PM

## 2014-06-18 NOTE — Progress Notes (Signed)
Occupational Therapy Session Note  Patient Details  Name: Melissa Lambert MRN: [id number] Date of Birth: Feb 21, 1929  Today's Date: 06/18/2014 OT Individual Time: 9935-7017 and 7939-0300 OT Individual Time Calculation (min): 50 min and 30 min    Short Term Goals: Week 1:  OT Short Term Goal 1 (Week 1): Pt will perform tub transfer onto TTB with Max A of 1 in order to decrease assistance with functional transfer. OT Short Term Goal 2 (Week 1): Pt will perform toilet transfer with Max A of 1 in order to decrease assistance with functional transfer. OT Short Term Goal 3 (Week 1): Pt will perform 1/3 tasks of toileting in order to decrease assistance for functional tasks.  OT Short Term Goal 4 (Week 1): Pt will perform LB dressing with Max A of 1 in order to decrease assistance with self care.  Skilled Therapeutic Interventions/Progress Updates:  Session 1: Pt seated in wheelchair upon entering the room with 3/10 c/o pain in R LE. Pt performed UB bathing and dressing seated in wheelchair. Pt then reporting needing to use toilet with transfer utilizing STEDY to [facility] due to urgency. Pt requiring Max A of 2 to stand from wheelchair into STEDY and Min A from elevated height of stedy. Pt required total A for hygiene and clothing manage after toileting. Pt then returned to wheelchair with use of STEDY and NT tech present in room to assist with any further needs upon exiting.   Session 2: Upon entering room, pt seated in wheelchair with 2 sisters present in room. Therapist assisted pt with donning of both shoes for session. OT session with focus on leaning forward for functional transfers and weight shifting onto R LE for functional ambulation. Pt performed STS x 2 from wheelchair with Max A with verbal cues for hand placement and tactile cues for posture once standing. Pt ambulated 2 bouts of 4 feet and 8 feet respectively with focus on weight shifting onto R LE and RW advancement to return to [employer]. Pt  ambulated with Mod A for balance and advancement. Pt seated in wheelchair with call bell and all needs within reach upon exiting.   Therapy Documentation Precautions:  Precautions Precautions: Fall Precaution Comments: h/o falls Restrictions Weight Bearing Restrictions: Yes RLE Weight Bearing: Weight bearing as tolerated Pain: Pain Assessment Pain Score: 2   See FIM for current functional status  Therapy/Group: Individual Therapy  Phineas Semen 06/18/2014, 4:01 PM

## 2014-06-18 NOTE — Plan of Care (Signed)
Problem: RH BLADDER ELIMINATION Goal: RH STG MANAGE BLADDER WITH ASSISTANCE STG Manage Bladder With Assistance. Min A  Outcome: Progressing  Problem: RH SKIN INTEGRITY Goal: RH STG MAINTAIN SKIN INTEGRITY WITH ASSISTANCE STG Maintain Skin Integrity With Assistance. Mod A  Outcome: Progressing  Problem: RH PAIN MANAGEMENT Goal: RH STG PAIN MANAGED AT OR BELOW PT'S PAIN GOAL Outcome: Progressing

## 2014-06-18 NOTE — Progress Notes (Signed)
Physical Therapy Session Note  Patient Details  Name: Melissa Lambert MRN: [id number] Date of Birth: Oct 04, 1928  Today's Date: 06/18/2014 PT Individual Time: 5053-9767 PT Individual Time Calculation (min): 45 min   Short Term Goals: Week 1:  PT Short Term Goal 1 (Week 1): Pt will perform rolling R and L with mod A without bedrails PT Short Term Goal 2 (Week 1): Pt will perform bed mobility with mod A with HOB flat and without bed rails PT Short Term Goal 3 (Week 1): Pt will perform sit<>stand with RW at mod A level from higher surfaces PT Short Term Goal 4 (Week 1): Pt will perform stand pivot transfer with RW at mod A level  PT Short Term Goal 5 (Week 1): Pt will ambulate with RW x 20' with max A of single therapist (+2 for chair follow)  Skilled Therapeutic Interventions/Progress Updates:  Pt was seen bedside in the am. Pt transferred supine to edge of bed with side rail and head of bed elevated with mod A and verbal cues. Pt transferred edge of bed to w/c with rolling walker and mod A with verbal cues. Pt very fearful of falling, tends to keep weight shifted posteriorly requiring verbal cues to correct. Pt transported to rehab gym. Pt transferred sit to stand with rolling walker and mod A with verbal cues. Pt tolerated standing about 30 seconds with min guard to min A. Pt transferred sit to stand with rolling walker and mod A, pt abruptly sat back down during transfer due to weight shifted posteriorly and fear. Following transfer discussed with pt the need to keep weight shifted anterior to assist with transfers. Pt verbalized understanding. Following treatment, pt returned to room and left sitting up in w/c with call bell within reach.   Therapy Documentation Precautions:  Precautions Precautions: Fall Precaution Comments: h/o falls Restrictions Weight Bearing Restrictions: Yes RLE Weight Bearing: Weight bearing as tolerated General:   Vital Signs: Pt 2 liters nasal canula, O2 sats  94-97% during therapy.   Pain: Pt c/o mod pain R hip.   See FIM for current functional status  Therapy/Group: Individual Therapy  Dub Amis 06/18/2014, 12:21 PM

## 2014-06-18 NOTE — Progress Notes (Signed)
Melissa Lambert is a 80 y.o. female 1929/01/27 [id number]  Subjective: No new complaints: anxiety as before. No new problems. Slept well. Feeling OK.  Objective: Vital signs in last 24 hours: Temp:  [98 F (36.7 C)-98.3 F (36.8 C)] 98 F (36.7 C) (11/07 0600) Pulse Rate:  [67-76] 67 (11/07 0600) Resp:  [17-18] 18 (11/07 0600) BP: (123-142)/(50-65) 142/65 mmHg (11/07 0600) SpO2:  [85 %-94 %] 93 % (11/07 0600) Weight change:  Last BM Date: 06/17/14  Intake/Output from previous day: 11/06 0701 - 11/07 0700 In: 840 [P.O.:840] Out: -  Last cbgs: CBG (last 3)  No results for input(s): GLUCAP in the last 72 hours.   Physical Exam General: No apparent distress   HEENT: not dry Lungs: Normal effort. Lungs clear to auscultation, no crackles or wheezes. Cardiovascular: Regular rate and rhythm, no edema Abdomen: S/NT/ND; BS(+) Musculoskeletal:  unchanged Neurological: No new neurological deficits Wounds: N/A    Skin: clear  Aging changes Mental state: Alert, oriented, cooperative    Lab Results: BMET    Component Value Date/Time   NA 131* 06/17/2014 0552   K 4.1 06/17/2014 0552   CL 94* 06/17/2014 0552   CO2 28 06/17/2014 0552   GLUCOSE 116* 06/17/2014 0552   BUN 12 06/17/2014 0552   CREATININE 0.62 06/17/2014 0552   CALCIUM 8.5 06/17/2014 0552   GFRNONAA 81* 06/17/2014 0552   GFRAA >90 06/17/2014 0552   CBC    Component Value Date/Time   WBC 6.6 06/17/2014 0552   RBC 3.03* 06/17/2014 0552   HGB 9.1* 06/17/2014 0552   HCT 26.9* 06/17/2014 0552   PLT 222 06/17/2014 0552   MCV 88.8 06/17/2014 0552   MCH 30.0 06/17/2014 0552   MCHC 33.8 06/17/2014 0552   RDW 13.6 06/17/2014 0552   LYMPHSABS 2.7 06/17/2014 0552   MONOABS 0.6 06/17/2014 0552   EOSABS 0.3 06/17/2014 0552   BASOSABS 0.0 06/17/2014 0552    Studies/Results: No results found.  Medications: I have reviewed the patient's current medications.  Assessment/Plan:   1. Functional deficits  secondary to right subtrochanteric femur fracture. Status post IM nailing 06/13/2014. Weightbearing as tolerated 2. 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  3. Pain Management: Hydrocodone as needed. Monitor with increased mobility 4. Acute blood loss anemia. Follow-up CBC 5. Neuropsych: This patient is capable of making decisions on her own behalf. 6. Skin/Wound Care: routine skin checks 7. Fluids/Electrolytes/Nutrition: follow-up chemistries appropriate today. Strict I and O's 8.Hypertension. No present antihypertensive medication. Patient on Norvasc 2.5 mg twice a day prior to admission and Diovan 320-25 milligrams daily.Monitor with increased mobility and resume antihypertensive medications as needed 9.Overactive Bladder. Ditropan 5 mg daily. Checking pvr's 10.GERD. Protonix 11. Left shoulder contractures secondary to history of severe osteoarthritis and total shoulder arthroplasty 12. Anxiety - on meds prn   Length of stay, days: 2  Walker Kehr , MD 06/18/2014, 9:52 AM

## 2014-06-18 NOTE — Progress Notes (Signed)
Physical Therapy Session Note  Patient Details  Name: Melissa Lambert MRN: [id number] Date of Birth: 29-Sep-1928  Today's Date: 06/18/2014 PT Individual Time: 1345-1430 PT Individual Time Calculation (min): 45 min   Short Term Goals: Week 1:  PT Short Term Goal 1 (Week 1): Pt will perform rolling R and L with mod A without bedrails PT Short Term Goal 2 (Week 1): Pt will perform bed mobility with mod A with HOB flat and without bed rails PT Short Term Goal 3 (Week 1): Pt will perform sit<>stand with RW at mod A level from higher surfaces PT Short Term Goal 4 (Week 1): Pt will perform stand pivot transfer with RW at mod A level  PT Short Term Goal 5 (Week 1): Pt will ambulate with RW x 20' with max A of single therapist (+2 for chair follow)  Skilled Therapeutic Interventions/Progress Updates:  Pt was seen bedside in the pm sitting up in recliner. Pt transferred recliner to w/c with rolling walker and mod A with verbal cues. Pt transported to rehab gym. Pt switched to a 20" x 16" w/c. Pt transferred w/c to w/c with mod A and rolling walker, verbal cues for technique. Pt transferred sit to stand with rolling walker and mod A. Pt ambulated with rolling walker and max A about 7 feet with verbal cues for technique. Pt requires increased time for all transfers and max encouragement due to fear of falling.   Therapy Documentation Precautions:  Precautions Precautions: Fall Precaution Comments: h/o falls Restrictions Weight Bearing Restrictions: Yes RLE Weight Bearing: Weight bearing as tolerated General:   Pain: Pt c/o mod pain R hip .   Locomotion : Ambulation Ambulation/Gait Assistance: 2: Max assist   See FIM for current functional status  Therapy/Group: Individual Therapy  Dub Amis 06/18/2014, 3:09 PM

## 2014-06-19 ENCOUNTER — Inpatient Hospital Stay (HOSPITAL_COMMUNITY): Payer: Medicare Other | Admitting: Rehabilitation

## 2014-06-19 LAB — PROTIME-INR
INR: 2.53 — ABNORMAL HIGH (ref 0.00–1.49)
Prothrombin Time: 27.5 seconds — ABNORMAL HIGH (ref 11.6–15.2)

## 2014-06-19 MED ORDER — WARFARIN SODIUM 10 MG PO TABS
10.0000 mg | ORAL_TABLET | ORAL | Status: DC
  Filled 2014-06-19: qty 1

## 2014-06-19 MED ORDER — WARFARIN SODIUM 7.5 MG PO TABS
7.5000 mg | ORAL_TABLET | ORAL | Status: DC
  Administered 2014-06-19 – 2014-06-22 (×3): 7.5 mg via ORAL
  Filled 2014-06-19 (×4): qty 1

## 2014-06-19 NOTE — Progress Notes (Signed)
ANTICOAGULATION CONSULT NOTE - FOLLOW UP  Pharmacy Consult:  Coumadin Indication:  History of PE and Protein C/S deficiencies  Allergies  Allergen Reactions  . Adhesive [Tape] Itching  . Penicillins Itching, Swelling and Rash    Streptomycin also  . Tuberculin Tests Itching, Swelling and Rash    Patient Measurements: Height: 5\' 1"  (154.9 cm) Weight: 202 lb 13.2 oz (92 kg) IBW/kg (Calculated) : 47.8  Vital Signs:    Labs:  Recent Labs  06/17/14 0552 06/18/14 0409 06/19/14 0620  HGB 9.1*  --   --   HCT 26.9*  --   --   PLT 222  --   --   LABPROT 25.1* 25.2* 27.5*  INR 2.25* 2.26* 2.53*  CREATININE 0.62  --   --     Estimated Creatinine Clearance: 55.1 mL/min (by C-G formula based on Cr of 0.62).    Assessment: 52 YOF continues on Coumadin for history of PE and protein C and S deficiencies.  INR therapeutic; no bleeding reported.   Goal of Therapy:  INR 2-3    Plan:  - Coumadin 7.5 mg PO daily except 10mg  on Tues and Sat - Daily PT / INR for now   Mirca Yale D. Mina Marble, PharmD, BCPS Pager:  [phone] 06/19/2014, 11:03 AM

## 2014-06-19 NOTE — Plan of Care (Signed)
Problem: RH PAIN MANAGEMENT Goal: RH STG PAIN MANAGED AT OR BELOW PT'S PAIN GOAL Outcome: Progressing

## 2014-06-19 NOTE — Progress Notes (Signed)
Physical Therapy Session Note  Patient Details  Name: Melissa Lambert MRN: [id number] Date of Birth: [birth year]  Today's Date: 06/19/2014 PT Individual Time: 1000-1045 PT Individual Time Calculation (min): 45 min   Short Term Goals: Week 1:  PT Short Term Goal 1 (Week 1): Pt will perform rolling R and L with mod A without bedrails PT Short Term Goal 2 (Week 1): Pt will perform bed mobility with mod A with HOB flat and without bed rails PT Short Term Goal 3 (Week 1): Pt will perform sit<>stand with RW at mod A level from higher surfaces PT Short Term Goal 4 (Week 1): Pt will perform stand pivot transfer with RW at mod A level  PT Short Term Goal 5 (Week 1): Pt will ambulate with RW x 20' with max A of single therapist (+2 for chair follow)  Skilled Therapeutic Interventions/Progress Updates:   Pt received lying in bed, asking for pain meds.  RN aware and brought pain meds during session.  Performed bed mobility with HOB slightly elevated and with bed rail.  Performed at mod A level for assisting RLE out of bed and also to assist trunk into sitting, but note marked improvement today vs Friday.  Once at EOB, took pain meds and assisted with donning shoes prior to transfer.  Performed stand pivot transfer with RW at mod A level with demonstration and verbal cues for using forward momentum with counting "1,2,3" before standing to assist with forward weight shift.  Again, note marked improvement in standing and performed stand pivot transfer with min A and mod cues for stepping sequence.  Assisted to/from therapy gym via w/c at total A level in order to work on gait training and therex.  Performed 18' x 1 and another 12' x 1 with RW at min A level (+2 for close chair follow).  Cues for upright posture, increasing L step length, as well as increasing WB through RLE and UEs with L step to increase smoothness of step.   SaO2 was 91% during and following gait on RA.  RN made aware.  Pt assisted back to room and  left in w/c with all needs in reach and sister in room.   Therapy Documentation Precautions:  Precautions Precautions: Fall Precaution Comments: h/o falls Restrictions Weight Bearing Restrictions: Yes RLE Weight Bearing: Weight bearing as tolerated   Pain: Pain Assessment Pain Score: 3  Pain Type: Surgical pain Pain Location: Hip Pain Orientation: Right Pain Intervention(s): Medication (See eMAR)  See FIM for current functional status  Therapy/Group: Individual Therapy  Denice Bors 06/19/2014, 10:29 AM

## 2014-06-19 NOTE — Plan of Care (Signed)
Problem: RH BLADDER ELIMINATION Goal: RH STG MANAGE BLADDER WITH ASSISTANCE STG Manage Bladder With Assistance. Min A  Outcome: Progressing  Problem: RH SKIN INTEGRITY Goal: RH STG MAINTAIN SKIN INTEGRITY WITH ASSISTANCE STG Maintain Skin Integrity With Assistance. Mod A  Outcome: Progressing  Problem: RH PAIN MANAGEMENT Goal: RH STG PAIN MANAGED AT OR BELOW PT'S PAIN GOAL Outcome: Progressing

## 2014-06-19 NOTE — Progress Notes (Addendum)
Melissa Lambert is a 80 y.o. female [birth year] [id number]  Subjective: . No new problems. Slept well. Feeling OK.  Objective: Vital signs in last 24 hours: Temp:  [98.2 F (36.8 C)] 98.2 F (36.8 C) (11/07 1608) Pulse Rate:  [68] 68 (11/07 1608) Resp:  [18] 18 (11/07 1608) BP: (130)/(62) 130/62 mmHg (11/07 1608) SpO2:  [92 %-96 %] 96 % (11/07 1608) Weight change:  Last BM Date: 06/17/14  Intake/Output from previous day: 11/07 0701 - 11/08 0700 In: 600 [P.O.:600] Out: -  Last cbgs: CBG (last 3)  No results for input(s): GLUCAP in the last 72 hours.   Physical Exam General: No apparent distress   HEENT: not dry. Eyes were examined during the episode of "darkening" - nl exam. Lungs: Normal effort. Lungs clear to auscultation, no crackles or wheezes. Cardiovascular: Regular rate and rhythm, no edema Abdomen: S/NT/ND; BS(+) Musculoskeletal:  unchanged Neurological: No new neurological deficits Wounds: N/A    Skin: clear  Aging changes Mental state: Alert, oriented, cooperative    Lab Results: BMET    Component Value Date/Time   NA 131* 06/17/2014 0552   K 4.1 06/17/2014 0552   CL 94* 06/17/2014 0552   CO2 28 06/17/2014 0552   GLUCOSE 116* 06/17/2014 0552   BUN 12 06/17/2014 0552   CREATININE 0.62 06/17/2014 0552   CALCIUM 8.5 06/17/2014 0552   GFRNONAA 81* 06/17/2014 0552   GFRAA >90 06/17/2014 0552   CBC    Component Value Date/Time   WBC 6.6 06/17/2014 0552   RBC 3.03* 06/17/2014 0552   HGB 9.1* 06/17/2014 0552   HCT 26.9* 06/17/2014 0552   PLT 222 06/17/2014 0552   MCV 88.8 06/17/2014 0552   MCH 30.0 06/17/2014 0552   MCHC 33.8 06/17/2014 0552   RDW 13.6 06/17/2014 0552   LYMPHSABS 2.7 06/17/2014 0552   MONOABS 0.6 06/17/2014 0552   EOSABS 0.3 06/17/2014 0552   BASOSABS 0.0 06/17/2014 0552    Studies/Results: No results found.  Medications: I have reviewed the patient's current medications.  Assessment/Plan:   1. Functional deficits  secondary to right subtrochanteric femur fracture. Status post IM nailing 06/13/2014. Weightbearing as tolerated 2. 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  3. Pain Management: Hydrocodone as needed. Monitor with increased mobility 4. Acute blood loss anemia. Follow-up CBC 5. Neuropsych: This patient is capable of making decisions on her own behalf. 6. Skin/Wound Care: routine skin checks 7. Fluids/Electrolytes/Nutrition: follow-up chemistries appropriate today. Strict I and O's 8.Hypertension. No present antihypertensive medication. Patient on Norvasc 2.5 mg twice a day prior to admission and Diovan 320-25 milligrams daily.Monitor with increased mobility and resume antihypertensive medications as needed 9.Overactive Bladder. Ditropan 5 mg daily. Checking pvr's 10.GERD. Protonix 11. Left shoulder contractures secondary to history of severe osteoarthritis and total shoulder arthroplasty 12. Anxiety - on meds prn    Length of stay, days: 3  Walker Kehr , MD 06/19/2014, 8:21 AM

## 2014-06-19 NOTE — Plan of Care (Signed)
Problem: RH SKIN INTEGRITY Goal: RH STG MAINTAIN SKIN INTEGRITY WITH ASSISTANCE STG Maintain Skin Integrity With Assistance. Mod A  Outcome: Progressing

## 2014-06-20 ENCOUNTER — Inpatient Hospital Stay (HOSPITAL_COMMUNITY): Payer: Medicare Other

## 2014-06-20 ENCOUNTER — Inpatient Hospital Stay (HOSPITAL_COMMUNITY): Payer: Medicare Other | Admitting: Physical Therapy

## 2014-06-20 DIAGNOSIS — S72009S Fracture of unspecified part of neck of unspecified femur, sequela: Secondary | ICD-10-CM

## 2014-06-20 DIAGNOSIS — K5901 Slow transit constipation: Secondary | ICD-10-CM

## 2014-06-20 LAB — PROTIME-INR
INR: 2.65 — ABNORMAL HIGH (ref 0.00–1.49)
Prothrombin Time: 28.5 seconds — ABNORMAL HIGH (ref 11.6–15.2)

## 2014-06-20 MED ORDER — TRIAMCINOLONE ACETONIDE 0.025 % EX CREA
TOPICAL_CREAM | Freq: Two times a day (BID) | CUTANEOUS | Status: DC
  Administered 2014-06-20: 1 via TOPICAL
  Administered 2014-06-21 – 2014-06-24 (×8): via TOPICAL
  Administered 2014-06-25 – 2014-06-27 (×3): 1 via TOPICAL
  Administered 2014-06-28: 08:00:00 via TOPICAL
  Filled 2014-06-20: qty 15

## 2014-06-20 NOTE — Progress Notes (Signed)
ANTICOAGULATION CONSULT NOTE - FOLLOW UP  Pharmacy Consult:  Coumadin Indication:  History of PE and Protein C/S deficiencies  Allergies  Allergen Reactions  . Adhesive [Tape] Itching  . Penicillins Itching, Swelling and Rash    Streptomycin also  . Tuberculin Tests Itching, Swelling and Rash    Patient Measurements: Height: 5\' 1"  (154.9 cm) Weight: 202 lb 13.2 oz (92 kg) IBW/kg (Calculated) : 47.8  Vital Signs: Temp: 98.2 F (36.8 C) (11/09 0600) Temp Source: Oral (11/09 0600) BP: 156/64 mmHg (11/09 0600) Pulse Rate: 72 (11/09 0600)  Labs:  Recent Labs  06/18/14 0409 06/19/14 0620 06/20/14 0357  LABPROT 25.2* 27.5* 28.5*  INR 2.26* 2.53* 2.65*    Estimated Creatinine Clearance: 55.1 mL/min (by C-G formula based on Cr of 0.62).    Assessment: 61 YOF continues on Coumadin for history of PE and protein C and S deficiencies.  INR therapeutic; no bleeding reported.   Goal of Therapy:  INR 2-3    Plan:  - Coumadin 7.5 mg PO daily except 10mg  on Tues and Sat - Daily PT / INR for now  Maryanna Shape, PharmD, BCPS  Clinical Pharmacist  Pager: [phone]   06/20/2014, 1:07 PM

## 2014-06-20 NOTE — Progress Notes (Signed)
Occupational Therapy Session Note  Patient Details  Name: Melissa Lambert MRN: [id number] Date of Birth: [birth year]  Today's Date: 06/20/2014 OT Individual Time: 1400-1500 OT Individual Time Calculation (min): 60 min    Short Term Goals: Week 1:  OT Short Term Goal 1 (Week 1): Pt will perform tub transfer onto TTB with Max A of 1 in order to decrease assistance with functional transfer. OT Short Term Goal 2 (Week 1): Pt will perform toilet transfer with Max A of 1 in order to decrease assistance with functional transfer. OT Short Term Goal 3 (Week 1): Pt will perform 1/3 tasks of toileting in order to decrease assistance for functional tasks.  OT Short Term Goal 4 (Week 1): Pt will perform LB dressing with Max A of 1 in order to decrease assistance with self care.  Skilled Therapeutic Interventions/Progress Updates: ADL-retraining with focus on transfer to TTB, home safety with falls, home modifications education.   Pt received seated in her w/c reporting that she had just finished bathing and dressing (pan/sink bath) with Engineer, manufacturing.   OT questioned pt on performance with tub transfers which provoked prolonged discussion on pt's home, bathroom, falls and previous injuries.   With min redirection, pt agreed to attempt trasnfer to TTB in tub room this session to assess as to whether she could manage similar transfer at her home.   Pt completed sit>stand with min assist and ambulated 5' to tub bench, sat at bench (adjusted to lowest setting), and completed pivot while seated but was unable to tolerate knee extension and hip flexion needed to lift right leg of tub side d/t her short stature.   OT advised use of small foot platform which pt considered however she was too fatigued to continue.   Pt was returned to her room to continue discussion on her falls and was educated on use of [employer] 911 pendant for EMS services.   Pt examined pendant and requested printed material to share with her sister  on pendant and on tub bench, which she plan to continue to practice with transfers.   Pt left in w/c awaiting physical therapist with call light within reach.    Therapy Documentation Precautions:  Precautions Precautions: Fall Precaution Comments: h/o falls Restrictions Weight Bearing Restrictions: Yes RLE Weight Bearing: Weight bearing as tolerated  Pain: Pain Assessment Pain Assessment: 0-10 Pain Score: 0-No pain Pain Type: Surgical pain Pain Location: Hip Pain Orientation: Right Pain Descriptors / Indicators: Sore Pain Onset: Gradual Pain Intervention(s): Rest  See FIM for current functional status  Therapy/Group: Individual Therapy  [name] 06/20/2014, 3:18 PM

## 2014-06-20 NOTE — Progress Notes (Signed)
Physical Therapy Session Note  Patient Details  Name: Melissa Lambert MRN: [id number] Date of Birth: October 15, 1928  Today's Date: 06/20/2014 PT Individual Time: 7371-0626 and 1500-1600 PT Individual Time Calculation (min): 68 min and 60 min   Short Term Goals: Week 1:  PT Short Term Goal 1 (Week 1): Pt will perform rolling R and L with mod A without bedrails (pt sleeps in lift chair at home due to breathing issues) PT Short Term Goal 1 - Progress (Week 1): Discontinued (comment) PT Short Term Goal 2 (Week 1): Pt will perform bed mobility with mod A with HOB flat and without bed rails PT Short Term Goal 2 - Progress (Week 1): Discontinued (comment) (pt sleeps in lift chair at home due to breathing issues) PT Short Term Goal 3 (Week 1): Pt will perform sit<>stand with RW at mod A level from higher surfaces PT Short Term Goal 4 (Week 1): Pt will perform stand pivot transfer with RW at mod A level  PT Short Term Goal 5 (Week 1): Pt will ambulate with RW x 20' with max A of single therapist (+2 for chair follow)  Skilled Therapeutic Interventions/Progress Updates:   Pt received in bed with sister present.  Pt reporting need to urinate and agreeable to use BSC.  Performed supine > sit with HOB elevated and bed rail with max A to maintain anterior and lateral leans to bring hips to EOB (pt tends to fall posterior).  EOB pt shoes donned; pt performed stand pivot bed > w/c with RW initially with +2 present but pt performing >50% of sit > stand and pivoting transfer.  Pt required total A for toileting tasks but pt able to perform prolonged standing with UE support on RW and min A for clothing doffing/donning and hygiene.  Performed stand pivot BSC > w/c with mod A with verbal cues for pivoting sequence and pt noted to be sliding each foot during pivot vs. Picking each foot up.  In gym performed pre-gait training with UE support on RW and focus on lateral weight shifting and tapping each foot to 2" step; pt able  to tap RLE with extra time but with LLE taps pt reporting pain and popping in R hip and requested to stop.  While pt standing performed 10 reps R hip flexion/marches and hip ABD while inserting apple board under cushion to provide pt more stable seat base.  While pt rested in sitting adjusted RW down for improved use of UE to unweight RLE with decreased strain through shoulders.  Pt performed gait training forwards and retro x 12' each with RW and min-mod A with verbal and tactile cues for upright posture and full step length bilaterally.  Returned to w/c and to room to rest before B&D.    PM session: Pt received in w/c; transitioned to gym in w/c total A.  Continued pre-gait and stair training standing with UE support on stair rails with focus on lifting each foot to first step; pt able to advance RLE to step x 5 reps but unable to advance LLE secondary to pain in RLE.  Changed to gait training with lateral stepping to L and R with RW x 6' each direction with focus on full lateral weight shifts.  Pt noted to keep RLE ER; in sitting performed 10 reps active hip IR/ADD with towel squeeze between thighs.  Performed dynamic standing balance training with pt releasing RW for 1-3 minutes at a time to reach out of BOS in various  planes of movement to retrieve objects with bilat UE with min A.  Returned to room in w/c and pt requesting to stay up until supper.      Therapy Documentation Precautions:  Precautions Precautions: Fall Precaution Comments: h/o falls Restrictions Weight Bearing Restrictions: Yes RLE Weight Bearing: Weight bearing as tolerated Vital Signs: Maintained Sp02 >90% during therapy session on RA but did demonstrate DOE during supine > sit to EOB. Pain: Pain Assessment Pain Assessment: 0-10 Pain Score: 5  Pain Type: Surgical pain Pain Location: Hip Pain Orientation: Right Pain Descriptors / Indicators: Sore Pain Onset: Gradual Pain Intervention(s): Rest Locomotion  : Ambulation Ambulation/Gait Assistance: 4: Min assist   See FIM for current functional status  Therapy/Group: Individual Therapy  Raylene Everts Faucette 06/20/2014, 12:50 PM

## 2014-06-20 NOTE — Progress Notes (Signed)
[facility] PHYSICAL MEDICINE & REHABILITATION     PROGRESS NOTE    Subjective/Complaints: Slept well, no bowel or bladder issues, using laxatives  Review of Systems - Negative except RIght hip pain with PT activities  Objective: Vital Signs: Blood pressure 156/64, pulse 72, temperature 98.2 F (36.8 C), temperature source Oral, resp. rate 18, height 5\' 1"  (1.549 m), weight 92 kg (202 lb 13.2 oz), SpO2 93 %. No results found. No results for input(s): WBC, HGB, HCT, PLT in the last 72 hours. No results for input(s): NA, K, CL, GLUCOSE, BUN, CREATININE, CALCIUM in the last 72 hours.  Invalid input(s): CO CBG (last 3)  No results for input(s): GLUCAP in the last 72 hours.  Wt Readings from Last 3 Encounters:  06/16/14 92 kg (202 lb 13.2 oz)  06/12/14 89.812 kg (198 lb)    Physical Exam:  Constitutional: obese, no distress HENT: oral mucosa pink Head: Normocephalic.  Eyes: EOM are normal.  Neck: Normal range of motion. Neck supple. No thyromegaly present.  Cardiovascular: Normal rate and regular rhythm.  Respiratory: Effort normal and breath sounds normal. No respiratory distress.  GI: Soft. Bowel sounds are normal. She exhibits no distension.  Skin:  Right hip surgical site dressed/moves with minimal pain M/s: right shoulder ROM limited Neuro: alert and oriented. hoh 3 minus left deltoid, 4/5 right deltoid, 4/5 bilateral biceps triceps and grip 3- right hip flexion 3- knee extension for ankle dorsiflexion plantar flexion 4/5 in the left hip flexor and extensor ankle dorsi flexors and plantar fldexor Sensory intact to light touch bilateral lower extremity Psych: pleasant and appropriate  Assessment/Plan: 1. Functional deficits secondary to right subtrochanteric femur fracture which require 3+ hours per day of interdisciplinary therapy in a comprehensive inpatient rehab setting. Physiatrist is providing close team supervision and 24 hour management of active  medical problems listed below. Physiatrist and rehab team continue to assess barriers to discharge/monitor patient progress toward functional and medical goals. FIM: FIM - Bathing Bathing Steps Patient Completed: Chest, Right Arm, Left Arm, Abdomen, Right upper leg, Left upper leg Bathing: 3: Mod-Patient completes 5-7 50f 10 parts or 50-74%  FIM - Upper Body Dressing/Undressing Upper body dressing/undressing steps patient completed: Thread/unthread right bra strap, Thread/unthread right sleeve of pullover shirt/dresss, Thread/unthread left sleeve of pullover shirt/dress, Put head through opening of pull over shirt/dress, Pull shirt over trunk, Hook/unhook bra Upper body dressing/undressing: 4: Min-Patient completed 75 plus % of tasks FIM - Lower Body Dressing/Undressing Lower body dressing/undressing: 1: Two helpers  FIM - Toileting Toileting steps completed by patient: Adjust clothing prior to toileting, Performs perineal hygiene, Adjust clothing after toileting Toileting: 1: Two helpers  FIM - Radio producer Devices: Engineer, civil (consulting), Insurance account manager Transfers: 1-Mechanical lift  FIM - Control and instrumentation engineer Devices: Walker, Arm rests, Bed rails, HOB elevated Bed/Chair Transfer: 3: Supine > Sit: Mod A (lifting assist/Pt. 50-74%/lift 2 legs, 3: Bed > Chair or W/C: Mod A (lift or lower assist)  FIM - Locomotion: Wheelchair Distance: 30 Locomotion: Wheelchair: 1: Total Assistance/staff pushes wheelchair (Pt<25%) FIM - Locomotion: Ambulation Locomotion: Ambulation Assistive Devices: Administrator Ambulation/Gait Assistance: 4: Min assist Locomotion: Ambulation: 1: Travels less than 50 ft with minimal assistance (Pt.>75%)  Comprehension Comprehension Mode: Auditory Comprehension: 7-Follows complex conversation/direction: With no assist  Expression Expression Mode: Verbal Expression: 5-Expresses complex 90% of the time/cues < 10%  of the time  Social Interaction Social Interaction: 5-Interacts appropriately 90% of the time - Needs monitoring  or encouragement for participation or interaction.  Problem Solving Problem Solving: 4-Solves basic 75 - 89% of the time/requires cueing 10 - 24% of the time  Memory Memory: 4-Recognizes or recalls 75 - 89% of the time/requires cueing 10 - 24% of the time  Medical Problem List and Plan: 1. Functional deficits secondary to right subtrochanteric femur fracture. Status post IM nailing 06/13/2014. Weightbearing as tolerated 2. 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   3. Pain Management: Hydrocodone as needed. Monitor with increased mobility 4. Acute blood loss anemia. Follow-up CBC 5. Neuropsych: This patient is capable of making decisions on her own behalf. 6. Skin/Wound Care: routine skin checks 7. Fluids/Electrolytes/Nutrition: follow-up chemistries appropriate today. Strict I and O's 8.Hypertension. No present antihypertensive medication. Patient on Norvasc 2.5 mg twice a day prior to admission and Diovan 320-25 milligrams daily.Monitor with increased mobility and resume antihypertensive medications as needed 9.Overactive Bladder. Ditropan 5 mg daily. Checking pvr's 10.GERD. Protonix 11. Left shoulder contractures secondary to history of severe osteoarthritis and total shoulder arthroplasty LOS (Days) 4 A FACE TO FACE EVALUATION WAS PERFORMED  KIRSTEINS,ANDREW E 06/20/2014 7:12 AM

## 2014-06-21 ENCOUNTER — Inpatient Hospital Stay (HOSPITAL_COMMUNITY): Payer: Medicare Other | Admitting: Physical Therapy

## 2014-06-21 ENCOUNTER — Inpatient Hospital Stay (HOSPITAL_COMMUNITY): Payer: Medicare Other | Admitting: Occupational Therapy

## 2014-06-21 ENCOUNTER — Inpatient Hospital Stay (HOSPITAL_COMMUNITY): Payer: Medicare Other | Admitting: Rehabilitation

## 2014-06-21 ENCOUNTER — Encounter (HOSPITAL_COMMUNITY): Payer: Medicare Other

## 2014-06-21 DIAGNOSIS — T814XXA Infection following a procedure, initial encounter: Secondary | ICD-10-CM

## 2014-06-21 LAB — PROTIME-INR
INR: 2.75 — ABNORMAL HIGH (ref 0.00–1.49)
Prothrombin Time: 29.3 seconds — ABNORMAL HIGH (ref 11.6–15.2)

## 2014-06-21 MED ORDER — WARFARIN SODIUM 7.5 MG PO TABS
7.5000 mg | ORAL_TABLET | Freq: Once | ORAL | Status: AC
  Administered 2014-06-21: 7.5 mg via ORAL
  Filled 2014-06-21: qty 1

## 2014-06-21 MED ORDER — WARFARIN SODIUM 10 MG PO TABS: 10.0000 mg | ORAL_TABLET | ORAL | Status: DC

## 2014-06-21 MED ORDER — DOXYCYCLINE HYCLATE 100 MG PO TABS
100.0000 mg | ORAL_TABLET | Freq: Two times a day (BID) | ORAL | Status: DC
  Administered 2014-06-21 – 2014-06-26 (×12): 100 mg via ORAL
  Filled 2014-06-21 (×15): qty 1

## 2014-06-21 NOTE — Plan of Care (Signed)
Problem: RH BLADDER ELIMINATION Goal: RH STG MANAGE BLADDER WITH ASSISTANCE STG Manage Bladder With Assistance. Min A  Outcome: Progressing Continent during the day

## 2014-06-21 NOTE — Plan of Care (Signed)
Problem: RH PAIN MANAGEMENT Goal: RH STG PAIN MANAGED AT OR BELOW PT'S PAIN GOAL Outcome: Progressing <4   

## 2014-06-21 NOTE — Progress Notes (Signed)
Occupational Therapy Session Note  Patient Details  Name: Valda Christenson MRN: [id number] Date of Birth: 1928-09-28  Today's Date: 06/21/2014 OT Individual Time: 3762-8315 OT Individual Time Calculation (min): 60 min    Short Term Goals: Week 1:  OT Short Term Goal 1 (Week 1): Pt will perform tub transfer onto TTB with Max A of 1 in order to decrease assistance with functional transfer. OT Short Term Goal 2 (Week 1): Pt will perform toilet transfer with Max A of 1 in order to decrease assistance with functional transfer. OT Short Term Goal 3 (Week 1): Pt will perform 1/3 tasks of toileting in order to decrease assistance for functional tasks.  OT Short Term Goal 4 (Week 1): Pt will perform LB dressing with Max A of 1 in order to decrease assistance with self care.  Skilled Therapeutic Interventions/Progress Updates:    1:1 Pt coming out of bathroom when arrived. Discussed goal of our session. Self care retraining at shower level with focus on sit to stands from different height surfaces, backing up to step into a shower stall, problem solving home situation and how to be most independent now in her home environment, functional ambulation with RW with min A, activity tolerance, standing tolerance and balance during bathing periarea, clothing management and grooming at sink for toothbrushing, standing balance without UE support for clothing management, use of reacher for threading lower body clothing. Sister present for session.  2nd session 11:15-11:45 make up session  1:1 Focus on functional ambulation to and from the bathroom, sit to stand and standing balance during toileting. Pt required A for hygiene due to inability to get bottom thoroughly cleaned. Pt able to take more weight through her right LE coming out of the bathroom demonstrating more normal symmetrical ambulation pattern.  Pt returned to w/c in prep for lunch. Pt did well tolerating 1hr and 30 min straight of functional activity with  rest breaks prn. Sister present for session.   Therapy Documentation Precautions:  Precautions Precautions: Fall Precaution Comments: h/o falls Restrictions Weight Bearing Restrictions: Yes RLE Weight Bearing: Weight bearing as tolerated General:   Vital Signs:  Pain: asessment for both sessions Pain Assessment Pain Assessment: 0-10 Pain Score: 4  Pain Type: Surgical pain Pain Location: Hip Pain Orientation: Right Pain Descriptors / Indicators: Aching;Tightness;Discomfort;Grimacing Pain Onset: With Activity Pain Intervention(s): Medication (See eMAR);Repositioned;Rest   Ice applied to right hip after 2nd session for comfort and for pain management   See FIM for current functional status  Therapy/Group: Individual Therapy  Willeen Cass [facility] 06/21/2014, 11:53 AM

## 2014-06-21 NOTE — Progress Notes (Signed)
Physical Therapy Session Note  Patient Details  Name: Melissa Lambert MRN: [id number] Date of Birth: 1928-11-25  Today's Date: 06/21/2014 PT Individual Time: 1300-1400 PT Individual Time Calculation (min): 60 min   Short Term Goals: Week 1:  PT Short Term Goal 1 (Week 1): Pt will perform rolling R and L with mod A without bedrails (pt sleeps in lift chair at home due to breathing issues) PT Short Term Goal 1 - Progress (Week 1): Discontinued (comment) PT Short Term Goal 2 (Week 1): Pt will perform bed mobility with mod A with HOB flat and without bed rails PT Short Term Goal 2 - Progress (Week 1): Discontinued (comment) (pt sleeps in lift chair at home due to breathing issues) PT Short Term Goal 3 (Week 1): Pt will perform sit<>stand with RW at mod A level from higher surfaces PT Short Term Goal 4 (Week 1): Pt will perform stand pivot transfer with RW at mod A level  PT Short Term Goal 5 (Week 1): Pt will ambulate with RW x 20' with max A of single therapist (+2 for chair follow)  Skilled Therapeutic Interventions/Progress Updates:   Pt received sitting in w/c in room, agreeable to therapy session.  Assisted pt to/from therapy gym via w/c at total A.  Performed stand pivot transfer with RW w/c<>mat at mod A level (mod A to stand and min A for transfer).  Performed supine therex as follows; RLE SLR x 10 reps (PT assisted and used leg lifter to assist), RLE knee flex (with maxi slide and leg lifter), R LE hip abd x 10 reps (with maxi slide and leg lifter).  Pt tolerated, however with increased pain, therefore needed pillow under knees between exercises with extended time for rest breaks.  Assisted into and out of supine with use of leg lifter with demonstration on how to utilize properly.  Pt able to self assist RLE out of bed, however still needed some assist to control descent of trunk and assist for safety of RLE out of bed.  Will continue to work on.  Then attempted to perform stairs for  strengthening and to increase amount of weight shift with BUE support on handrails.  Pt able to initiate stepping and move heel of LLE off of ground, however unable to actually step despite 4 attempts.  Pt with increased anxiety during task, limiting pt as well.  Ended session with gait training x 70' with RW at min/guard to min A level.  Continued cues for upright head and chest posture with noted improvement in steps when she does this throughout.  Pt assisted back to w/c and back to room.  Left in w/c with all needs in reach and sister present.    Therapy Documentation Precautions:  Precautions Precautions: Fall Precaution Comments: h/o falls Restrictions Weight Bearing Restrictions: Yes RLE Weight Bearing: Weight bearing as tolerated  Pain: Pain Assessment Pain Assessment: 0-10 Pain Score: 4  Pain Type: Surgical pain Pain Location: Hip Pain Orientation: Right Pain Descriptors / Indicators: Aching;Tightness;Discomfort;Grimacing Pain Onset: With Activity Pain Intervention(s): Medication (See eMAR);Repositioned;Rest   Locomotion : Ambulation Ambulation/Gait Assistance: 4: Min assist;4: Min Building control surveyor Distance: 100   See FIM for current functional status  Therapy/Group: Individual Therapy  Denice Bors 06/21/2014, 1:21 PM

## 2014-06-21 NOTE — Progress Notes (Signed)
ANTICOAGULATION CONSULT NOTE - FOLLOW UP  Pharmacy Consult:  Coumadin Indication:  History of PE and Protein C/S deficiencies  Allergies  Allergen Reactions  . Adhesive [Tape] Itching  . Penicillins Itching, Swelling and Rash    Streptomycin also  . Tuberculin Tests Itching, Swelling and Rash    Patient Measurements: Height: 5\' 1"  (154.9 cm) Weight: 202 lb 13.2 oz (92 kg) IBW/kg (Calculated) : 47.8  Vital Signs: Temp: 98.2 F (36.8 C) (11/10 0539) Temp Source: Oral (11/10 0539) BP: 146/58 mmHg (11/10 0539) Pulse Rate: 71 (11/10 0539)  Labs:  Recent Labs  06/19/14 0620 06/20/14 0357 06/21/14 0500  LABPROT 27.5* 28.5* 29.3*  INR 2.53* 2.65* 2.75*    Estimated Creatinine Clearance: 55.1 mL/min (by C-G formula based on Cr of 0.62).   Assessment: 88 YOF continues on Coumadin for history of PE and protein C and S deficiencies.  INR therapeutic; no bleeding reported. Started doxycycline today for superficial cellulitis of R thigh wound, which might potentiate coumadin effect.   PTA dose: Coumadin 7.5 mg PO daily except 10mg  on Tues and Sat  Goal of Therapy:  INR 2-3    Plan:  - Coumadin 7.5mg  po x 1 today then continue Coumadin 7.5 mg PO daily except 10mg  on Tues and Sat - Daily PT / INR for now  Maryanna Shape, PharmD, BCPS  Clinical Pharmacist  Pager: [phone]  06/21/2014, 1:08 PM

## 2014-06-21 NOTE — Progress Notes (Signed)
Physical Therapy Session Note  Patient Details  Name: Melissa Lambert MRN: [id number] Date of Birth: [birth year]  Today's Date: 06/21/2014 PT Individual Time: 0832-0932 PT Individual Time Calculation (min): 60 min   Short Term Goals: Week 1:  PT Short Term Goal 1 (Week 1): Pt will perform rolling R and L with mod A without bedrails (pt sleeps in lift chair at home due to breathing issues) PT Short Term Goal 1 - Progress (Week 1): Discontinued (comment) PT Short Term Goal 2 (Week 1): Pt will perform bed mobility with mod A with HOB flat and without bed rails PT Short Term Goal 2 - Progress (Week 1): Discontinued (comment) (pt sleeps in lift chair at home due to breathing issues) PT Short Term Goal 3 (Week 1): Pt will perform sit<>stand with RW at mod A level from higher surfaces PT Short Term Goal 4 (Week 1): Pt will perform stand pivot transfer with RW at mod A level  PT Short Term Goal 5 (Week 1): Pt will ambulate with RW x 20' with max A of single therapist (+2 for chair follow)  Skilled Therapeutic Interventions/Progress Updates:   Pt received sitting in recliner with sister present. Pt requesting socks/shoes for session, therapist assisted with donning them due to time constraints. Pt performed stand pivot transfer to w/c using RW with min A. W/c mobility using BUE x 100 ft with supervision and increased time. Gait training x 30 ft using RW with min guard, pt reporting increase in pain 10/10. RN notified and administered pain meds during session. Pt required prolonged seated rest due to pain in R hip. Pt transferred w/c <> NuStep with mod A using RW. Pt able to tolerate NuStep Level 1 using BLE and RUE x total of 10 min with 1 rest break. Pt returned to room and left sitting in w/c with all needs within reach.   Therapy Documentation Precautions:  Precautions Precautions: Fall Precaution Comments: h/o falls Restrictions Weight Bearing Restrictions: Yes RLE Weight Bearing: Weight bearing  as tolerated Pain: Pain Assessment Pain Assessment: 0-10 Pain Score: 10-Worst pain ever Pain Type: Surgical pain Pain Location: Hip Pain Orientation: Right Pain Descriptors / Indicators: Aching;Tightness;Discomfort;Grimacing Pain Onset: With Activity Pain Intervention(s): Medication (See eMAR);Repositioned;Rest  See FIM for current functional status  Therapy/Group: Individual Therapy  Laretta Alstrom 06/21/2014, 9:38 AM

## 2014-06-21 NOTE — Plan of Care (Signed)
Problem: RH SKIN INTEGRITY Goal: RH STG MAINTAIN SKIN INTEGRITY WITH ASSISTANCE STG Maintain Skin Integrity With Assistance. Mod A  Outcome: Progressing

## 2014-06-21 NOTE — Progress Notes (Signed)
[place] PHYSICAL MEDICINE & REHABILITATION     PROGRESS NOTE    Subjective/Complaints: Slept well, no bowel or bladder issues, using laxatives  Review of Systems - Negative except RIght hip pain with PT activities  Objective: Vital Signs: Blood pressure 146/58, pulse 71, temperature 98.2 F (36.8 C), temperature source Oral, resp. rate 20, height 5\' 1"  (1.549 m), weight 92 kg (202 lb 13.2 oz), SpO2 93 %. No results found. No results for input(s): WBC, HGB, HCT, PLT in the last 72 hours. No results for input(s): NA, K, CL, GLUCOSE, BUN, CREATININE, CALCIUM in the last 72 hours.  Invalid input(s): CO CBG (last 3)  No results for input(s): GLUCAP in the last 72 hours.  Wt Readings from Last 3 Encounters:  06/16/14 92 kg (202 lb 13.2 oz)  06/12/14 89.812 kg (198 lb)    Physical Exam:  Constitutional: obese, no distress HENT: oral mucosa pink Head: Normocephalic.  Eyes: EOM are normal.  Neck: Normal range of motion. Neck supple. No thyromegaly present.  Cardiovascular: Normal rate and regular rhythm.  Respiratory: Effort normal and breath sounds normal. No respiratory distress.  GI: Soft. Bowel sounds are normal. She exhibits no distension.  Skin:  Right hip surgical site erythema no drainage, R lat and inf thigh CDI M/s: right shoulder ROM limited Neuro: alert and oriented. hoh 3 minus left deltoid, 4/5 right deltoid, 4/5 bilateral biceps triceps and grip 3- right hip flexion 3- knee extension for ankle dorsiflexion plantar flexion 4/5 in the left hip flexor and extensor ankle dorsi flexors and plantar fldexor Sensory intact to light touch bilateral lower extremity Psych: pleasant and appropriate  Assessment/Plan: 1. Functional deficits secondary to right subtrochanteric femur fracture which require 3+ hours per day of interdisciplinary therapy in a comprehensive inpatient rehab setting. Physiatrist is providing close team supervision and 24 hour management of  active medical problems listed below. Physiatrist and rehab team continue to assess barriers to discharge/monitor patient progress toward functional and medical goals. FIM: FIM - Bathing Bathing Steps Patient Completed: Chest, Right Arm, Left Arm, Abdomen, Right upper leg, Left upper leg Bathing: 3: Mod-Patient completes 5-7 58f 10 parts or 50-74%  FIM - Upper Body Dressing/Undressing Upper body dressing/undressing steps patient completed: Thread/unthread right bra strap, Thread/unthread right sleeve of pullover shirt/dresss, Thread/unthread left sleeve of pullover shirt/dress, Put head through opening of pull over shirt/dress, Pull shirt over trunk, Hook/unhook bra Upper body dressing/undressing: 4: Min-Patient completed 75 plus % of tasks FIM - Lower Body Dressing/Undressing Lower body dressing/undressing: 1: Two helpers  FIM - Toileting Toileting steps completed by patient: Adjust clothing prior to toileting, Performs perineal hygiene, Adjust clothing after toileting Toileting: 1: Total-Patient completed zero steps, helper did all 3  FIM - Radio producer Devices: Engineer, civil (consulting), Insurance account manager Transfers: 3-To toilet/BSC: Mod A (lift or lower assist), 3-From toilet/BSC: Mod A (lift or lower assist)  FIM - Control and instrumentation engineer Devices: Walker, Bed rails, HOB elevated Bed/Chair Transfer: 3: Supine > Sit: Mod A (lifting assist/Pt. 50-74%/lift 2 legs, 3: Chair or W/C > Bed: Mod A (lift or lower assist), 3: Bed > Chair or W/C: Mod A (lift or lower assist)  FIM - Locomotion: Wheelchair Distance: 30 Locomotion: Wheelchair: 1: Total Assistance/staff pushes wheelchair (Pt<25%) FIM - Locomotion: Ambulation Locomotion: Ambulation Assistive Devices: Administrator Ambulation/Gait Assistance: 4: Min assist Locomotion: Ambulation: 1: Travels less than 50 ft with minimal assistance (Pt.>75%)  Comprehension Comprehension Mode:  Auditory Comprehension: 7-Follows complex  conversation/direction: With no assist  Expression Expression Mode: Verbal Expression: 5-Expresses complex 90% of the time/cues < 10% of the time  Social Interaction Social Interaction: 5-Interacts appropriately 90% of the time - Needs monitoring or encouragement for participation or interaction.  Problem Solving Problem Solving: 4-Solves basic 75 - 89% of the time/requires cueing 10 - 24% of the time  Memory Memory: 4-Recognizes or recalls 75 - 89% of the time/requires cueing 10 - 24% of the time  Medical Problem List and Plan: 1. Functional deficits secondary to right subtrochanteric femur fracture. Status post IM nailing 06/13/2014. Weightbearing as tolerated 2. 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   3. Pain Management: Hydrocodone as needed. Monitor with increased mobility 4. Acute blood loss anemia. Follow-up CBC 5. Neuropsych: This patient is capable of making decisions on her own behalf. 6. Skin/Wound Care: routine skin checks, start abx for superficial celluilitis R thigh wound 7. Fluids/Electrolytes/Nutrition: follow-up chemistries appropriate today. Strict I and O's 8.Hypertension. No present antihypertensive medication. Patient on Norvasc 2.5 mg twice a day prior to admission and Diovan 320-25 milligrams daily.Monitor with increased mobility and resume antihypertensive medications as needed 9.Overactive Bladder. Ditropan 5 mg daily. Checking pvr's 10.GERD. Protonix 11. Left shoulder contractures secondary to history of severe osteoarthritis and total shoulder arthroplasty LOS (Days) 5 A FACE TO FACE EVALUATION WAS PERFORMED  KIRSTEINS,ANDREW E 06/21/2014 7:42 AM

## 2014-06-22 ENCOUNTER — Inpatient Hospital Stay (HOSPITAL_COMMUNITY): Payer: Medicare Other | Admitting: Rehabilitation

## 2014-06-22 ENCOUNTER — Inpatient Hospital Stay (HOSPITAL_COMMUNITY): Payer: Medicare Other | Admitting: Occupational Therapy

## 2014-06-22 LAB — PROTIME-INR
INR: 2.75 — ABNORMAL HIGH (ref 0.00–1.49)
PROTHROMBIN TIME: 29.3 s — AB (ref 11.6–15.2)

## 2014-06-22 NOTE — Patient Care Conference (Signed)
Inpatient RehabilitationTeam Conference and Plan of Care Update Date: 06/22/2014   Time: 11;45 AM    Patient Name: Melissa Lambert      Medical Record Number: [id number]  Date of Birth: 1929-03-16 Sex: Female         Room/Bed: 4M06C/4M06C-01 Payor Info: Payor: MEDICARE / Plan: MEDICARE PART A AND B / Product Type: *No Product type* /    Admitting Diagnosis: subtrachanteric femur fx  Admit Date/Time:  06/16/2014  4:33 PM Admission Comments: No comment available   Primary Diagnosis:  <principal problem not specified> Principal Problem: <principal problem not specified>  Patient Active Problem List   Diagnosis Date Noted  . Hip fracture 06/16/2014  . Essential hypertension   . Shoulder joint contracture   . Protein C deficiency   . Protein S deficiency   . Femur fracture, right 06/12/2014  . History of pulmonary embolus (PE) 06/12/2014    Expected Discharge Date: Expected Discharge Date: 06/30/14  Team Members Present: Physician leading conference: Dr. Alysia Penna Social Worker Present: Ovidio Kin, LCSW Nurse Present: Elliot Cousin, RN PT Present: Cameron Sprang, PT;Mykel Mohl Jari Favre, PT OT Present: Benay Pillow, Maryella Shivers, OT SLP Present: Gunnar Fusi, SLP PPS Coordinator present : Daiva Nakayama, RN, CRRN     Current Status/Progress Goal Weekly Team Focus  Medical   Right hip pain  Maintaining comfort to ensure adequate rehabilitation process patient  Increase exercise tolerance   Bowel/Bladder   Continent of bowel and bladder. LBM 06/21/14  Pt to remain continent of bowel and bladder  Assess   Swallow/Nutrition/ Hydration     na        ADL's   Mod A for STS and functional transfers, Balance standing with min A, grooming standing at sink with steady A, min A for UB ADLs, Mod A for LB dressing and bathing  supervision - Min A  functional transfers, endurance, self care task, AE training, functional balance   Mobility   mod A for bed mobility, esp without bed  rails and HOB flat, mod A for sit<>stand, min A stand pivot transfers, min A gait.  Limited by body habitus, poor endurance and pain in RLE.   S overall, min A car transfer  transfers, bed mobility, RLE strengthening, activity tolerance   Communication     na        Safety/Cognition/ Behavioral Observations    no unsafe behaviors        Pain   Pain managed with Vicodin x 1 q 4hrs prn for R hip discomfort  <4  Offer pain medication 1 hour prior to initial therapy session   Skin   R hip incision x 2, R upper incision with redness along incisional line. Stage 2 pressure ulcer with visible slough, Allevyn dressing to area  No additional skin breakdown  Turn q 2hrs, Assess skin q shift for appropriate healing      *See Care Plan and progress notes for long and short-term goals.  Barriers to Discharge: Poor endurance, elderly caregiver    Possible Resolutions to Barriers:  Continue rehabilitation program    Discharge Planning/Teaching Needs:  HOme to her home with sister staying with her and assisting with her care-sister is here daily      Team Discussion:  Limited by poor endurance, goals -supervision/min level, pain being managed.  Sister here to attend therapies with pt. Stage 2 on sacrum-dressing changes. Progress is slow but each day is getting better.  Revisions to Treatment Plan:  None  Continued Need for Acute Rehabilitation Level of Care: The patient requires daily medical management by a physician with specialized training in physical medicine and rehabilitation for the following conditions: Daily direction of a multidisciplinary physical rehabilitation program to ensure safe treatment while eliciting the highest outcome that is of practical value to the patient.: Yes Daily medical management of patient stability for increased activity during participation in an intensive rehabilitation regime.: Yes Daily analysis of laboratory values and/or radiology reports with any  subsequent need for medication adjustment of medical intervention for : Other;Post surgical problems  Reham Slabaugh, Gardiner Rhyme 06/22/2014, 2:32 PM

## 2014-06-22 NOTE — Progress Notes (Signed)
Physical Therapy Session Note  Patient Details  Name: Melissa Lambert MRN: [id number] Date of Birth: [birth year]  Today's Date: 06/22/2014 PT Individual Time: 1000-1100 PT Individual Time Calculation (min): 60 min   Short Term Goals: Week 1:  PT Short Term Goal 1 (Week 1): Pt will perform rolling R and L with mod A without bedrails (pt sleeps in lift chair at home due to breathing issues) PT Short Term Goal 1 - Progress (Week 1): Discontinued (comment) PT Short Term Goal 2 (Week 1): Pt will perform bed mobility with mod A with HOB flat and without bed rails PT Short Term Goal 2 - Progress (Week 1): Discontinued (comment) (pt sleeps in lift chair at home due to breathing issues) PT Short Term Goal 3 (Week 1): Pt will perform sit<>stand with RW at mod A level from higher surfaces PT Short Term Goal 4 (Week 1): Pt will perform stand pivot transfer with RW at mod A level  PT Short Term Goal 5 (Week 1): Pt will ambulate with RW x 20' with max A of single therapist (+2 for chair follow)  Skilled Therapeutic Interventions/Progress Updates:   Pt received sitting in w/c in room, sister present during session to observe.  Pt ambulated to ADL apt x 14' with min/guard A for safety with continued cues for upright posture, increased L step length, and increasing WB through RLE.   Discussed using leg lifter to assist RLE into and out of bed while in ADL apt.  Also discussed whether or not she wanted to get back into her bed at home or use lift chair.  She states she would like to get back to bed.  Due to pts body habitus, she was unable to use leg lifter to assist RLE into bed, therefore requires min/mod A for getting into bed.  She was able to use leg lifter to assist RLE into better position in bed and used to get RLE out of bed.  Requires light min/guard when getting out of bed.  Note she was able to stand from low surface at min A level throughout session.  Ambulated from bed to w/c in ADL apt.  Discussed that  she has small step to step up onto to get into bed.  Pt self propelled w/c using BUEs to therapy gym at min A level in order to practice stepping up small 4" step with RW backwards.  She was able to perform with mod A for safety with demonstration and verbal cues for stepping sequence.  Ended session with seated nustep task with stand pivot transfer to/from w/c with RW at min/mod A.  Performed seated nustep x 6 mins at level 3-4 resistance with BUE/LEs for overall strengthening, endurance, and increasing ROM in RLE.  Pt tolerated well without rest break.  Transferred back to w/c and assisted back to room.  Applied ice pack to R hip and left in w/c with all needs in reach.  Sister present.   Continue to discuss needing measurements or picture of van seat height and step to get into bed if that's what she wants to do at home.  Also discussed going out to their [place] prior to D/C in order to determine if this is safest option.  Pt repeatedly states "I can step up in there and hook my foot.." therefore had to provide max education that she would not be stepping into [place] and that she would back up and sit, then place LEs into [place].  Both verbalized understanding.  Therapy Documentation Precautions:  Precautions Precautions: Fall Precaution Comments: h/o falls Restrictions Weight Bearing Restrictions: Yes RLE Weight Bearing: Weight bearing as tolerated  Pain: Pain Assessment Pain Assessment: 0-10 Pain Score: 3  Pain Type: Surgical pain Pain Location: Hip Pain Orientation: Right Pain Descriptors / Indicators: Aching Pain Frequency: Intermittent Pain Intervention(s): Repositioned;Emotional support   Locomotion : Ambulation Ambulation/Gait Assistance: 4: Min guard Wheelchair Mobility Distance: 50   See FIM for current functional status  Therapy/Group: Individual Therapy  Denice Bors 06/22/2014, 11:34 AM

## 2014-06-22 NOTE — Progress Notes (Signed)
Social Work Patient ID: Melissa Lambert, female   DOB: [birth year], 80 y.o.   MRN: [id number] Met with pt and sister to discuss team conference goals-supervision/min level goals and discharge 11/19.  Sister hopes she will be ready and will be here to learn her care. Pt was sedentary prior to admission and slept in a lift chair at home.  Will continue to work toward discharge and discuss needs closer to discharge.

## 2014-06-22 NOTE — Progress Notes (Signed)
ANTICOAGULATION CONSULT NOTE - Follow Up Consult  Pharmacy Consult for coumadin Indication: hx of PE  Allergies  Allergen Reactions  . Adhesive [Tape] Itching  . Penicillins Itching, Swelling and Rash    Streptomycin also  . Tuberculin Tests Itching, Swelling and Rash    Patient Measurements: Height: 5\' 1"  (154.9 cm) Weight: 202 lb 13.2 oz (92 kg) IBW/kg (Calculated) : 47.8 Heparin Dosing Weight:   Vital Signs: Temp: 98.5 F (36.9 C) (11/11 0500) Temp Source: Oral (11/11 0500) BP: 142/54 mmHg (11/11 0500) Pulse Rate: 69 (11/11 0500)  Labs:  Recent Labs  06/20/14 0357 06/21/14 0500  LABPROT 28.5* 29.3*  INR 2.65* 2.75*    Estimated Creatinine Clearance: 55.1 mL/min (by C-G formula based on Cr of 0.62).   Medications:  Scheduled:  . calcium-vitamin D  1 tablet Oral BID  . docusate sodium  100 mg Oral BID  . doxycycline  100 mg Oral Q12H  . loratadine  10 mg Oral Daily  . oxybutynin  5 mg Oral Daily  . pantoprazole  40 mg Oral Daily  . senna  2 tablet Oral BID  . triamcinolone   Topical BID  . [START ON 06/25/2014] warfarin  10 mg Oral Once per day on Tue Sat  . warfarin  7.5 mg Oral Once per day on Sun Mon Wed Thu Fri  . Warfarin - Pharmacist Dosing Inpatient   Does not apply q1800   Infusions:    Assessment: 80 yo female with hx of PE is currently on therapeutic coumadin.  INR is 2.75.  Patient is on doxycyline which may affect INR.  Goal of Therapy:  INR 2-3 Monitor platelets by anticoagulation protocol: Yes   Plan:  - Cont Coumadin 7.5 mg PO daily except 10mg  on Tues and Sat - Daily PT / INR for now  Eduarda Scrivens, Tsz-Yin 06/22/2014,8:13 AM

## 2014-06-22 NOTE — Progress Notes (Signed)
Social Work [name], [place] Social Worker Signed  Patient Care Conference 06/22/2014  2:32 PM    Expand All Collapse All   Inpatient RehabilitationTeam Conference and Plan of Care Update Date: 06/22/2014   Time: 11;45 AM     Patient Name: Melissa Lambert       Medical Record Number: [id number]  Date of Birth: [birth year] Sex: Female         Room/Bed: 4M06C/4M06C-01 Payor Info: Payor: MEDICARE / Plan: MEDICARE PART A AND B / Product Type: *No Product type* /    Admitting Diagnosis: subtrachanteric femur fx   Admit Date/Time:  06/16/2014  4:33 PM Admission Comments: No comment available   Primary Diagnosis:  <principal problem not specified> Principal Problem: <principal problem not specified>    Patient Active Problem List     Diagnosis  Date Noted   .  Hip fracture  06/16/2014   .  Essential hypertension     .  Shoulder joint contracture     .  Protein C deficiency     .  Protein S deficiency     .  Femur fracture, right  06/12/2014   .  History of pulmonary embolus (PE)  06/12/2014     Expected Discharge Date: Expected Discharge Date: 06/30/14  Team Members Present: Physician leading conference: Dr. Alysia Penna Social Worker Present: Ovidio Kin, LCSW Nurse Present: Elliot Cousin, RN PT Present: Cameron Sprang, PT;Aristotelis Vilardi Jari Favre, PT OT Present: Benay Pillow, Maryella Shivers, OT SLP Present: Gunnar Fusi, SLP PPS Coordinator present : Daiva Nakayama, RN, CRRN        Current Status/Progress  Goal  Weekly Team Focus   Medical     Right hip pain  Maintaining comfort to ensure adequate rehabilitation process patient  Increase exercise tolerance   Bowel/Bladder     Continent of bowel and bladder. LBM 06/21/14   Pt to remain continent of bowel and bladder   Assess   Swallow/Nutrition/ Hydration       na         ADL's     Mod A for STS and functional transfers, Balance standing with min A, grooming standing at sink with steady A, min A for UB ADLs, Mod A for  LB dressing and bathing  supervision - Min A  functional transfers, endurance, self care task, AE training, functional balance   Mobility     mod A for bed mobility, esp without bed rails and HOB flat, mod A for sit<>stand, min A stand pivot transfers, min A gait.  Limited by body habitus, poor endurance and pain in RLE.   S overall, min A car transfer  transfers, bed mobility, RLE strengthening, activity tolerance    Communication       na         Safety/Cognition/ Behavioral Observations      no unsafe behaviors         Pain     Pain managed with Vicodin x 1 q 4hrs prn for R hip discomfort   <4  Offer pain medication 1 hour prior to initial therapy session    Skin     R hip incision x 2, R upper incision with redness along incisional line. Stage 2 pressure ulcer with visible slough, Allevyn dressing to area  No additional skin breakdown  Turn q 2hrs, Assess skin q shift for appropriate healing       *See Care Plan and progress notes for long and short-term goals.  Barriers to Discharge:  Poor endurance, elderly caregiver     Possible Resolutions to Barriers:   Continue rehabilitation program     Discharge Planning/Teaching Needs:   HOme to her home with sister staying with her and assisting with her care-sister is here daily       Team Discussion:    Limited by poor endurance, goals -supervision/min level, pain being managed.  Sister here to attend therapies with pt. Stage 2 on sacrum-dressing changes. Progress is slow but each day is getting better.   Revisions to Treatment Plan:    None    Continued Need for Acute Rehabilitation Level of Care: The patient requires daily medical management by a physician with specialized training in physical medicine and rehabilitation for the following conditions: Daily direction of a multidisciplinary physical rehabilitation program to ensure safe treatment while eliciting the highest outcome that is of practical value to the patient.:  Yes Daily medical management of patient stability for increased activity during participation in an intensive rehabilitation regime.: Yes Daily analysis of laboratory values and/or radiology reports with any subsequent need for medication adjustment of medical intervention for : Other;Post surgical problems  [name] 06/22/2014, 2:32 PM                  Patient ID: Dorthula Rue, female   DOB: 20-Jul-1929, 80 y.o.   MRN: [id number]

## 2014-06-22 NOTE — Progress Notes (Signed)
[facility] PHYSICAL MEDICINE & REHABILITATION     PROGRESS NOTE    Subjective/Complaints: Forgot to take pain med prior to PT yesterday , using laxatives  Review of Systems - Negative except RIght hip pain with PT activities  Objective: Vital Signs: Blood pressure 142/54, pulse 69, temperature 98.5 F (36.9 C), temperature source Oral, resp. rate 16, height 5' 1" (1.549 m), weight 92 kg (202 lb 13.2 oz), SpO2 95 %. No results found. No results for input(s): WBC, HGB, HCT, PLT in the last 72 hours. No results for input(s): NA, K, CL, GLUCOSE, BUN, CREATININE, CALCIUM in the last 72 hours.  Invalid input(s): CO CBG (last 3)  No results for input(s): GLUCAP in the last 72 hours.  Wt Readings from Last 3 Encounters:  06/16/14 92 kg (202 lb 13.2 oz)  06/12/14 89.812 kg (198 lb)    Physical Exam:  Constitutional: obese, no distress HENT: oral mucosa pink Head: Normocephalic.  Eyes: EOM are normal.  Neck: Normal range of motion. Neck supple. No thyromegaly present.  Cardiovascular: Normal rate and regular rhythm.  Respiratory: Effort normal and breath sounds normal. No respiratory distress.  GI: Soft. Bowel sounds are normal. She exhibits no distension.  Skin:  Right hip surgical site erythema no drainage, R lat and inf thigh CDI M/s: right shoulder ROM limited Neuro: alert and oriented. hoh 3 minus left deltoid, 4/5 right deltoid, 4/5 bilateral biceps triceps and grip 3- right hip flexion 3- knee extension for ankle dorsiflexion plantar flexion 4/5 in the left hip flexor and extensor ankle dorsi flexors and plantar fldexor Sensory intact to light touch bilateral lower extremity Psych: pleasant and appropriate  Assessment/Plan: 1. Functional deficits secondary to right subtrochanteric femur fracture which require 3+ hours per day of interdisciplinary therapy in a comprehensive inpatient rehab setting. Physiatrist is providing close team supervision and 24 hour  management of active medical problems listed below. Physiatrist and rehab team continue to assess barriers to discharge/monitor patient progress toward functional and medical goals. Team conference today please see physician documentation under team conference tab, met with team face-to-face to discuss problems,progress, and goals. Formulized individual treatment plan based on medical history, underlying problem and comorbidities. FIM: FIM - Bathing Bathing Steps Patient Completed: Chest, Right upper leg, Left upper leg, Right Arm, Left Arm, Abdomen, Front perineal area Bathing: 3: Mod-Patient completes 5-7 13f10 parts or 50-74%  FIM - Upper Body Dressing/Undressing Upper body dressing/undressing steps patient completed: Thread/unthread right bra strap, Thread/unthread right sleeve of pullover shirt/dresss, Thread/unthread left sleeve of pullover shirt/dress, Put head through opening of pull over shirt/dress, Pull shirt over trunk, Thread/unthread left bra strap Upper body dressing/undressing: 4: Min-Patient completed 75 plus % of tasks FIM - Lower Body Dressing/Undressing Lower body dressing/undressing steps patient completed: Thread/unthread left underwear leg, Thread/unthread right underwear leg, Thread/unthread right pants leg, Thread/unthread left pants leg, Pull pants up/down, Pull underwear up/down Lower body dressing/undressing: 4: Min-Patient completed 75 plus % of tasks  FIM - Toileting Toileting steps completed by patient: Adjust clothing prior to toileting, Adjust clothing after toileting Toileting: 3: Mod-Patient completed 2 of 3 steps  FIM - TRadio producerDevices: Bedside commode, WInsurance account managerTransfers: 3-To toilet/BSC: Mod A (lift or lower assist), 3-From toilet/BSC: Mod A (lift or lower assist)  FIM - BControl and instrumentation engineerDevices: Walker, Arm rests Bed/Chair Transfer: 3: Supine > Sit: Mod A (lifting assist/Pt.  50-74%/lift 2 legs, 3: Sit > Supine: Mod A (lifting assist/Pt. 50-74%/lift  2 legs), 3: Bed > Chair or W/C: Mod A (lift or lower assist), 3: Chair or W/C > Bed: Mod A (lift or lower assist)  FIM - Locomotion: Wheelchair Distance: 100 Locomotion: Wheelchair: 1: Total Assistance/staff pushes wheelchair (Pt<25%) FIM - Locomotion: Ambulation Locomotion: Ambulation Assistive Devices: Administrator Ambulation/Gait Assistance: 4: Min assist, 4: Min guard Locomotion: Ambulation: 2: Travels 50 - 149 ft with minimal assistance (Pt.>75%)  Comprehension Comprehension Mode: Auditory Comprehension: 6-Follows complex conversation/direction: With extra time/assistive device  Expression Expression Mode: Verbal Expression: 5-Expresses complex 90% of the time/cues < 10% of the time  Social Interaction Social Interaction: 5-Interacts appropriately 90% of the time - Needs monitoring or encouragement for participation or interaction.  Problem Solving Problem Solving: 4-Solves basic 75 - 89% of the time/requires cueing 10 - 24% of the time  Memory Memory: 4-Recognizes or recalls 75 - 89% of the time/requires cueing 10 - 24% of the time  Medical Problem List and Plan: 1. Functional deficits secondary to right subtrochanteric femur fracture. Status post IM nailing 06/13/2014. Weightbearing as tolerated 2. 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   3. Pain Management: Hydrocodone as needed. Monitor with increased mobility 4. Acute blood loss anemia. Follow-up CBC 5. Neuropsych: This patient is capable of making decisions on her own behalf. 6. Skin/Wound Care: routine skin checks, start abx for superficial celluilitis R thigh wound 7. Fluids/Electrolytes/Nutrition: follow-up chemistries appropriate today. Strict I and O's 8.Hypertension. No present antihypertensive medication. Patient on Norvasc 2.5 mg twice a day prior to admission and  Diovan 320-25 milligrams daily.Monitor with increased mobility and resume antihypertensive medications as needed 9.Overactive Bladder. Ditropan 5 mg daily. Checking pvr's 10.GERD. Protonix 11. Left shoulder contractures secondary to history of severe osteoarthritis and total shoulder arthroplasty LOS (Days) 6 A FACE TO FACE EVALUATION WAS PERFORMED  Charlett Blake 06/22/2014 7:49 AM

## 2014-06-22 NOTE — Progress Notes (Signed)
Occupational Therapy Session Note  Patient Details  Name: Nica Friske MRN: [id number] Date of Birth: 06-19-1929  Today's Date: 06/22/2014 OT Individual Time: 4034-7425 and 1440-1540 OT Individual Time Calculation (min): 60 min and 60 min   Short Term Goals: Week 1:  OT Short Term Goal 1 (Week 1): Pt will perform tub transfer onto TTB with Max A of 1 in order to decrease assistance with functional transfer. OT Short Term Goal 2 (Week 1): Pt will perform toilet transfer with Max A of 1 in order to decrease assistance with functional transfer. OT Short Term Goal 3 (Week 1): Pt will perform 1/3 tasks of toileting in order to decrease assistance for functional tasks.  OT Short Term Goal 4 (Week 1): Pt will perform LB dressing with Max A of 1 in order to decrease assistance with self care.  Skilled Therapeutic Interventions/Progress Updates:  Session 1: Upon entering the room, pt seated in recliner chair with no c/o pain and sister present. Pt declined shower this session. Session with focus on endurance, self care retraining, patient and family education, and STS. Pt seated in wheelchair at sink side for bathing and dressing. Pt requiring Min A for balance when standing to wash peri area and buttocks. OT educated pt on use of long handled reacher to thread elastic waist pants and underwear in which pt demonstrated understanding with increased time and verbal cues for proper technique. Pt performed STS with Min - Mod A  X 6 reps during session with use of RW. Therapist continued to educate pt on pursed lip breathing during session secondary to fatigue. Pt seated in recliner chair upon exiting the room with call bell and all needed items within reach.  Session 2:  Pt seated in recliner chair upon entering the room, pt repots 3/10 pain in R hip described as an ache. Pt ambulated ~ 15 feet with RW and min A to sit in wheelchair. Pt propelled wheelchair with B UEs ~ 150 feet with increased time to tub  room. OT educated and demonstrated transfer onto TTB with use of RW and grab bar (as pt has one installed in shower currently). Pt returned demonstration of simulated tub transfer with Mod A for LEs and balance. OT recommended removal of back rest from TTB in order to give pt more room on bench to feel stable. OT educated pt on fall risks within the bathroom during functional activities as recommended purchase of hand held shower head, removal of throw rugs in bathroom, purchase of one non slip bath mat to place outside of tub, and purchase of steady strips. Pt transferred back to wheelchair with Min A to propel wheelchair back to room. Pt seated in wheelchair with sister present and all needs within reach.   Therapy Documentation Precautions:  Precautions Precautions: Fall Precaution Comments: h/o falls Restrictions Weight Bearing Restrictions: Yes RLE Weight Bearing: Weight bearing as tolerated Pain: Pain Assessment Pain Assessment: 0-10 Pain Score: 3  Pain Type: Surgical pain Pain Location: Hip Pain Orientation: Right Pain Descriptors / Indicators: Aching Pain Frequency: Intermittent Pain Intervention(s): Repositioned;Emotional support  See FIM for current functional status  Therapy/Group: Individual Therapy  Phineas Semen 06/22/2014, 11:52 AM

## 2014-06-23 ENCOUNTER — Inpatient Hospital Stay (HOSPITAL_COMMUNITY): Payer: Medicare Other | Admitting: Occupational Therapy

## 2014-06-23 ENCOUNTER — Inpatient Hospital Stay (HOSPITAL_COMMUNITY): Payer: Medicare Other | Admitting: Rehabilitation

## 2014-06-23 LAB — PROTIME-INR
INR: 3.12 — AB (ref 0.00–1.49)
PROTHROMBIN TIME: 32.4 s — AB (ref 11.6–15.2)

## 2014-06-23 MED ORDER — HYDROCODONE-ACETAMINOPHEN 5-325 MG PO TABS
1.0000 | ORAL_TABLET | Freq: Once | ORAL | Status: AC
  Administered 2014-06-23: 1 via ORAL
  Filled 2014-06-23: qty 1

## 2014-06-23 MED ORDER — WARFARIN SODIUM 7.5 MG PO TABS
7.5000 mg | ORAL_TABLET | ORAL | Status: DC
  Filled 2014-06-23: qty 1

## 2014-06-23 MED ORDER — WARFARIN SODIUM 3 MG PO TABS
3.0000 mg | ORAL_TABLET | Freq: Once | ORAL | Status: AC
  Administered 2014-06-23: 3 mg via ORAL
  Filled 2014-06-23: qty 1

## 2014-06-23 NOTE — Plan of Care (Signed)
Problem: RH BLADDER ELIMINATION Goal: RH STG MANAGE BLADDER WITH ASSISTANCE STG Manage Bladder With Assistance. Mod I  Outcome: Progressing     

## 2014-06-23 NOTE — Progress Notes (Signed)
Physical Therapy Session Note  Patient Details  Name: Melissa Lambert MRN: [id number] Date of Birth: 08-24-1928  Today's Date: 06/23/2014 PT Individual Time: 0930-1000 PT Individual Time Calculation (min): 30 min   Short Term Goals: Week 1:  PT Short Term Goal 1 (Week 1): Pt will perform rolling R and L with mod A without bedrails (pt sleeps in lift chair at home due to breathing issues) PT Short Term Goal 1 - Progress (Week 1): Discontinued (comment) PT Short Term Goal 2 (Week 1): Pt will perform bed mobility with mod A with HOB flat and without bed rails PT Short Term Goal 2 - Progress (Week 1): Discontinued (comment) (pt sleeps in lift chair at home due to breathing issues) PT Short Term Goal 3 (Week 1): Pt will perform sit<>stand with RW at mod A level from higher surfaces PT Short Term Goal 4 (Week 1): Pt will perform stand pivot transfer with RW at mod A level  PT Short Term Goal 5 (Week 1): Pt will ambulate with RW x 20' with max A of single therapist (+2 for chair follow)  Skilled Therapeutic Interventions/Progress Updates:   Pt received sitting in recliner, sister present in room but did not attend therapy session.  Skilled therapy session focused on gait training with RW for improved quality and distance for endurance, and car transfer to better simulate home vehicle and determine which vehicle will be safest.  Pt ambulated approx 125' to ortho gym at min/guard to close S level with continued cues for upright posture throughout and demonstration cues for increased smoothness of LLE and increased WB through RLE.  Elevated car height to lower SUV height to somewhat simulate van height.  Pt able to perform with min A with cues for safety and technique and assist for RLE into and out of car.  Educated pt to have sister present during Mondays session so that we could go out to the car. Pt verbalized understanding and also discussed with other sister in room. Pt assisted back to w/c and back to  room.  Transferred to recliner and left with all needs in reach.   Therapy Documentation Precautions:  Precautions Precautions: Fall Precaution Comments: h/o falls Restrictions Weight Bearing Restrictions: Yes RLE Weight Bearing: Weight bearing as tolerated  Pain: Pain Assessment Pain Score: 0-No pain   Locomotion : Ambulation Ambulation/Gait Assistance: 4: Min guard   See FIM for current functional status  Therapy/Group: Individual Therapy  Denice Bors 06/23/2014, 12:16 PM

## 2014-06-23 NOTE — Progress Notes (Signed)
Physical Therapy Session Note  Patient Details  Name: Melissa Lambert MRN: [id number] Date of Birth: 01/11/1929  Today's Date: 06/23/2014 PT Individual Time: 1300-1400 and 1500-1530  PT Individual Time Calculation (min): 60 min and 30 mins  Short Term Goals: Week 1:  PT Short Term Goal 1 (Week 1): Pt will perform rolling R and L with mod A without bedrails (pt sleeps in lift chair at home due to breathing issues) PT Short Term Goal 1 - Progress (Week 1): Discontinued (comment) PT Short Term Goal 2 (Week 1): Pt will perform bed mobility with mod A with HOB flat and without bed rails PT Short Term Goal 2 - Progress (Week 1): Discontinued (comment) (pt sleeps in lift chair at home due to breathing issues) PT Short Term Goal 3 (Week 1): Pt will perform sit<>stand with RW at mod A level from higher surfaces PT Short Term Goal 4 (Week 1): Pt will perform stand pivot transfer with RW at mod A level  PT Short Term Goal 5 (Week 1): Pt will ambulate with RW x 20' with max A of single therapist (+2 for chair follow)  Skilled Therapeutic Interventions/Progress Updates:   First PM session: Pt received sitting in recliner in room, RN present to provide meds and wound care nurse present to assess skin break down between buttocks.  Assisted pt with sit<>stand at min A level while wound care nurse assessed skin.  She states that breakdown is due to moisture and that allyvan dressing is adequate at this time.  Pt ambulated x 110' to ADL apt to have seated rest.  Requires close S for gait with continued cues for upright posture and larger steps on LLE.  Ambulated remainder of distance to therapy gym and performed sit<>supine at min A level.  Performed supine therex; RLE SLR x 10 reps, RLE hip abd x 10 reps, RLE heel slides, and RLE knee presses x 10 reps (utilized leg lifter).  Once at [facility], performed 3, 4" steps with B handrails at mod A level for strengthening and endurance.  Pt did much better today vs when  attempted two days ago.  Pt did become somewhat dizzy during last step, however improved with cues for continued pursed lip breathing.  Ended session with seated nustep x 5 mins at level 3 resistance with BUEs/LEs for overall strengthening and endurance.  Tolerated well.   Assisted back to w/c and back to room.  Transferred back to recliner with min A and left with all needs in reach.    Second PM session:  Pt received sitting in recliner, agreeable to therapy session.  Pt requesting to use restroom prior to leaving room.  Placed bedside commode over toilet in order to ambulate to/from restroom.  Performed gait x 15' to restroom with min/guard assist with cues for upright posture.  Pt able to stand at S level to adjust clothing prior to and following toileting, as well as peri care.  Ambulated back to sink and washed/dried hands at S level.  Remainder of session focused on w/c mobility with UEs to increase overall strengthening and endurance.  Performed 110' at S level with min A for turns.  Once in therapy gym, performed 5 mins of standing to perform connect four game for standing balance and tolerance at S level.  Pt returned to room and to recliner.  All needs in reach and sister present.    Therapy Documentation Precautions:  Precautions Precautions: Fall Precaution Comments: h/o falls Restrictions Weight Bearing Restrictions:  Yes RLE Weight Bearing: Weight bearing as tolerated  Pain: Pain Assessment Pain Assessment: 0-10 Pain Score: 2  Pain Type: Surgical pain Pain Location: Hip Pain Orientation: Right Pain Descriptors / Indicators: Aching Pain Frequency: Intermittent Pain Onset: With Activity Patients Stated Pain Goal: 2 Pain Intervention(s): Medication (See eMAR) (vicodin 1 po)   Locomotion : Ambulation Ambulation/Gait Assistance: 4: Min guard   See FIM for current functional status  Therapy/Group: Individual Therapy  Denice Bors 06/23/2014, 1:31 PM

## 2014-06-23 NOTE — Progress Notes (Signed)
Occupational Therapy Weekly Progress Note and OT Intervention  Patient Details  Name: Melissa Lambert MRN: [id number] Date of Birth: 10/20/1928  Beginning of progress report period: June 17, 2014 End of progress report period: June 23, 2014  Today's Date: 06/23/2014 OT Individual Time: 4098-1191 OT Individual Time Calculation (min): 60 min    Patient has met 4 of 4 short term goals. Pt is making steady progress towards occupational therapy goals since initial evaluation. Pt requiring assist of 1 person for B & D sessions as well as beginning education and practice with use of TTB for bathing. Sisters have been present during treatment sessions for education as well.   Patient continues to demonstrate the following deficits: decreased activity tolerance, decreased endurance, decreased I in self care, decreased strength in R LE, decreased functional transfer/mobility, and decreased standing balance and therefore will continue to benefit from skilled OT intervention to enhance overall performance with BADL.  Patient progressing toward long term goals..  Continue plan of care.  OT Short Term Goals Week 1:  OT Short Term Goal 1 (Week 1): Pt will perform tub transfer onto TTB with Max A of 1 in order to decrease assistance with functional transfer. OT Short Term Goal 1 - Progress (Week 1): Met OT Short Term Goal 2 (Week 1): Pt will perform toilet transfer with Max A of 1 in order to decrease assistance with functional transfer. OT Short Term Goal 2 - Progress (Week 1): Met OT Short Term Goal 3 (Week 1): Pt will perform 1/3 tasks of toileting in order to decrease assistance for functional tasks.  OT Short Term Goal 3 - Progress (Week 1): Met OT Short Term Goal 4 (Week 1): Pt will perform LB dressing with Max A of 1 in order to decrease assistance with self care. OT Short Term Goal 4 - Progress (Week 1): Met Week 2:  OT Short Term Goal 1 (Week 2): Continue to progress towards long term  goals based on estimated LOS  Skilled Therapeutic Interventions/Progress Updates:  Upon entering room, pt seated in recliner chair finishing breakfast. Pt with 7/10 c/o pain in R hip during session and calling RN for pain medication upon entering the room. OT session with focus on STS, self care with use of AE, functional transfers, and dynamic standing balance. STS from recliner chair with Mod A and ambulation to bathroom with increased time and min A for balance. Transfer onto shower chair with min A for safety during session. After bathing, pt ambulated to sink side for dressing. OT continued to educate pt on use of long handled reacher to thread B pant leg and panties with verbal cues for proper technique. Pt performing grooming tasks seated this session in order to conserve energy as pt was fatigued. O2 stats remained above 90% throughout session. Pt requesting to use [facility] with therapist assisting pt onto chair and exiting as NT arriving to assist pt with hygiene and clothing management as needed. Sister present in room during session.   Therapy Documentation Precautions:  Precautions Precautions: Fall Precaution Comments: h/o falls Restrictions Weight Bearing Restrictions: Yes RLE Weight Bearing: Weight bearing as tolerated  See FIM for current functional status  Therapy/Group: Individual Therapy  Phineas Semen 06/23/2014, 12:42 PM

## 2014-06-23 NOTE — Plan of Care (Signed)
Problem: RH BLADDER ELIMINATION Goal: RH STG MANAGE BLADDER WITH ASSISTANCE STG Manage Bladder With Assistance. Min A  Outcome: Progressing  Problem: RH SKIN INTEGRITY Goal: RH STG MAINTAIN SKIN INTEGRITY WITH ASSISTANCE STG Maintain Skin Integrity With Assistance. Mod A  Outcome: Progressing  Problem: RH PAIN MANAGEMENT Goal: RH STG PAIN MANAGED AT OR BELOW PT'S PAIN GOAL <5  Outcome: Progressing

## 2014-06-23 NOTE — Plan of Care (Signed)
Problem: RH PAIN MANAGEMENT Goal: RH STG PAIN MANAGED AT OR BELOW PT'S PAIN GOAL <5  Outcome: Progressing Rates pain as 3

## 2014-06-23 NOTE — Progress Notes (Signed)
ANTICOAGULATION CONSULT NOTE - Follow Up Consult  Pharmacy Consult for coumadin Indication: hx of PE  Allergies  Allergen Reactions  . Adhesive [Tape] Itching  . Penicillins Itching, Swelling and Rash    Streptomycin also  . Tuberculin Tests Itching, Swelling and Rash    Patient Measurements: Height: 5\' 1"  (154.9 cm) Weight: 202 lb 13.2 oz (92 kg) IBW/kg (Calculated) : 47.8 Heparin Dosing Weight:   Vital Signs: Temp: 98.8 F (37.1 C) (11/12 0531) Temp Source: Oral (11/12 0531) BP: 144/56 mmHg (11/12 0531) Pulse Rate: 69 (11/12 0531)  Labs:  Recent Labs  06/21/14 0500 06/22/14 0800 06/23/14 0355  LABPROT 29.3* 29.3* 32.4*  INR 2.75* 2.75* 3.12*    Estimated Creatinine Clearance: 55.1 mL/min (by C-G formula based on Cr of 0.62).   Medications:  Scheduled:  . calcium-vitamin D  1 tablet Oral BID  . docusate sodium  100 mg Oral BID  . doxycycline  100 mg Oral Q12H  . loratadine  10 mg Oral Daily  . oxybutynin  5 mg Oral Daily  . pantoprazole  40 mg Oral Daily  . senna  2 tablet Oral BID  . triamcinolone   Topical BID  . [START ON 06/25/2014] warfarin  10 mg Oral Once per day on Tue Sat  . warfarin  7.5 mg Oral Once per day on Sun Mon Wed Thu Fri  . Warfarin - Pharmacist Dosing Inpatient   Does not apply q1800   Infusions:    Assessment: 80 yo female with hx of PE is currently on therapeutic coumadin.  INR is 3.12 slightly supratherapeutic.  Patient is on doxycyline which may affect INR.   PTA dose: Coumadin 7.5 mg PO daily except 10mg  on Tues and Sat  Goal of Therapy:  INR 2-3 Monitor platelets by anticoagulation protocol: Yes   Plan:  - Coumadin 3 mg PO x 1 today - Continue home regimen tomorrow if INR not continue trending up - Daily PT / INR  Maryanna Shape, PharmD, BCPS  Clinical Pharmacist  Pager: [phone]   06/23/2014,9:16 AM

## 2014-06-23 NOTE — Plan of Care (Signed)
Problem: RH PAIN MANAGEMENT Goal: RH STG PAIN MANAGED AT OR BELOW PT'S PAIN GOAL <5  Outcome: Progressing

## 2014-06-23 NOTE — Progress Notes (Signed)
[facility] PHYSICAL MEDICINE & REHABILITATION     PROGRESS NOTE    Subjective/Complaints: Small BM yesterday, voiding well, pain under good control  Review of Systems - Negative except RIght hip pain with PT activities  Objective: Vital Signs: Blood pressure 144/56, pulse 69, temperature 98.8 F (37.1 C), temperature source Oral, resp. rate 16, height 5\' 1"  (1.549 m), weight 92 kg (202 lb 13.2 oz), SpO2 91 %. No results found. No results for input(s): WBC, HGB, HCT, PLT in the last 72 hours. No results for input(s): NA, K, CL, GLUCOSE, BUN, CREATININE, CALCIUM in the last 72 hours.  Invalid input(s): CO CBG (last 3)  No results for input(s): GLUCAP in the last 72 hours.  Wt Readings from Last 3 Encounters:  06/16/14 92 kg (202 lb 13.2 oz)  06/12/14 89.812 kg (198 lb)    Physical Exam:  Constitutional: obese, no distress HENT: oral mucosa pink Head: Normocephalic.  Eyes: EOM are normal.  Neck: Normal range of motion. Neck supple. No thyromegaly present.  Cardiovascular: Normal rate and regular rhythm.  Respiratory: Effort normal and breath sounds normal. No respiratory distress.  GI: Soft. Bowel sounds are normal. She exhibits no distension.  Skin:  Right hip surgical site erythema no drainage, R lat and inf thigh CDI M/s: right shoulder ROM limited Neuro: alert and oriented. hoh 3 minus left deltoid, 4/5 right deltoid, 4/5 bilateral biceps triceps and grip 3- right hip flexion 3- knee extension for ankle dorsiflexion plantar flexion 4/5 in the left hip flexor and extensor ankle dorsi flexors and plantar fldexor Sensory intact to light touch bilateral lower extremity Psych: pleasant and appropriate  Assessment/Plan: 1. Functional deficits secondary to right subtrochanteric femur fracture which require 3+ hours per day of interdisciplinary therapy in a comprehensive inpatient rehab setting. Physiatrist is providing close team supervision and 24 hour management  of active medical problems listed below. Physiatrist and rehab team continue to assess barriers to discharge/monitor patient progress toward functional and medical goals. Discussed D/C date with pt  FIM: FIM - Bathing Bathing Steps Patient Completed: Chest, Right Arm, Left Arm, Abdomen, Front perineal area Bathing: 3: Mod-Patient completes 5-7 57f 10 parts or 50-74%  FIM - Upper Body Dressing/Undressing Upper body dressing/undressing steps patient completed: Thread/unthread right bra strap, Thread/unthread right sleeve of pullover shirt/dresss, Thread/unthread left sleeve of pullover shirt/dress, Put head through opening of pull over shirt/dress, Pull shirt over trunk, Thread/unthread left bra strap Upper body dressing/undressing: 4: Min-Patient completed 75 plus % of tasks FIM - Lower Body Dressing/Undressing Lower body dressing/undressing steps patient completed: Thread/unthread left underwear leg, Thread/unthread right pants leg, Thread/unthread left pants leg, Thread/unthread right underwear leg, Pull pants up/down, Pull underwear up/down Lower body dressing/undressing: 3: Mod-Patient completed 50-74% of tasks  FIM - Toileting Toileting steps completed by patient: Adjust clothing prior to toileting, Adjust clothing after toileting Toileting: 3: Mod-Patient completed 2 of 3 steps  FIM - Radio producer Devices: Bedside commode, Insurance account manager Transfers: 3-To toilet/BSC: Mod A (lift or lower assist), 3-From toilet/BSC: Mod A (lift or lower assist)  FIM - Control and instrumentation engineer Devices: Walker, Arm rests Bed/Chair Transfer: 4: Supine > Sit: Min A (steadying Pt. > 75%/lift 1 leg), 3: Sit > Supine: Mod A (lifting assist/Pt. 50-74%/lift 2 legs), 3: Bed > Chair or W/C: Mod A (lift or lower assist), 3: Chair or W/C > Bed: Mod A (lift or lower assist)  FIM - Locomotion: Wheelchair Distance: 50 Locomotion: Wheelchair: 2: [employer]  33 - 149 ft  with minimal assistance (Pt.>75%) FIM - Locomotion: Ambulation Locomotion: Ambulation Assistive Devices: Walker - Rolling Ambulation/Gait Assistance: 4: Min guard Locomotion: Ambulation: 2: Travels 50 - 149 ft with minimal assistance (Pt.>75%)  Comprehension Comprehension Mode: Auditory Comprehension: 6-Follows complex conversation/direction: With extra time/assistive device  Expression Expression Mode: Verbal Expression: 5-Expresses complex 90% of the time/cues < 10% of the time  Social Interaction Social Interaction: 5-Interacts appropriately 90% of the time - Needs monitoring or encouragement for participation or interaction.  Problem Solving Problem Solving: 4-Solves basic 75 - 89% of the time/requires cueing 10 - 24% of the time  Memory Memory: 4-Recognizes or recalls 75 - 89% of the time/requires cueing 10 - 24% of the time  Medical Problem List and Plan: 1. Functional deficits secondary to right subtrochanteric femur fracture. Status post IM nailing 06/13/2014. Weightbearing as tolerated 2. 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   3. Pain Management: Hydrocodone as needed. Monitor with increased mobility 4. Acute blood loss anemia. Follow-up CBC 5. Neuropsych: This patient is capable of making decisions on her own behalf. 6. Skin/Wound Care: routine skin checks, start abx for superficial celluilitis R thigh wound 7. Fluids/Electrolytes/Nutrition: follow-up chemistries appropriate today. Strict I and O's 8.Hypertension. No present antihypertensive medication. Patient on Norvasc 2.5 mg twice a day prior to admission and Diovan 320-25 milligrams daily.Monitor with increased mobility and resume antihypertensive medications as needed 9.Overactive Bladder. Ditropan 5 mg daily. Checking pvr's 10.GERD. Protonix 11. Left shoulder contractures secondary to history of severe osteoarthritis and total shoulder  arthroplasty LOS (Days) 7 A FACE TO FACE EVALUATION WAS PERFORMED  Melissa Lambert E 06/23/2014 7:23 AM

## 2014-06-24 ENCOUNTER — Inpatient Hospital Stay (HOSPITAL_COMMUNITY): Payer: Medicare Other | Admitting: *Deleted

## 2014-06-24 ENCOUNTER — Inpatient Hospital Stay (HOSPITAL_COMMUNITY): Payer: Medicare Other | Admitting: Rehabilitation

## 2014-06-24 LAB — PROTIME-INR
INR: 3.09 — ABNORMAL HIGH (ref 0.00–1.49)
PROTHROMBIN TIME: 32.1 s — AB (ref 11.6–15.2)

## 2014-06-24 MED ORDER — WARFARIN SODIUM 7.5 MG PO TABS
7.5000 mg | ORAL_TABLET | Freq: Once | ORAL | Status: AC
  Administered 2014-06-24: 7.5 mg via ORAL
  Filled 2014-06-24: qty 1

## 2014-06-24 NOTE — Progress Notes (Signed)
Physical Therapy Weekly Progress Note  Patient Details  Name: Melissa Lambert MRN: [id number] Date of Birth: 1929-03-27  Beginning of progress report period: June 17, 2014 End of progress report period: June 24, 2014  Today's Date: 06/24/2014 PT Individual Time: 1000-1100 PT Individual Time Calculation (min): 60 min   Patient has met 3 of 3 short term goals.  Pt making very good progress with all aspects of mobility.  Continues to be limited by pain, body habitus, and decreased endurance.  She is now able to perform all transfers and gait with min/guard to close S assist level.  Have discussed with pt and sisters regarding concerns on car transfer.  Will plan to go to their car on Monday to determine which car will be safest.    Patient continues to demonstrate the following deficits: decreased strength, decreased endurance, increased pain, morbid obesity and therefore will continue to benefit from skilled PT intervention to enhance overall performance with activity tolerance, balance, ability to compensate for deficits and knowledge of precautions.  Patient progressing toward long term goals..  Continue plan of care.  PT Short Term Goals Week 1:  PT Short Term Goal 1 (Week 1): Pt will perform rolling R and L with mod A without bedrails (pt sleeps in lift chair at home due to breathing issues) PT Short Term Goal 1 - Progress (Week 1): Discontinued (comment) PT Short Term Goal 2 (Week 1): Pt will perform bed mobility with mod A with HOB flat and without bed rails PT Short Term Goal 2 - Progress (Week 1): Discontinued (comment) (pt sleeps in lift chair at home due to breathing issues) PT Short Term Goal 3 (Week 1): Pt will perform sit<>stand with RW at mod A level from higher surfaces PT Short Term Goal 3 - Progress (Week 1): Met PT Short Term Goal 4 (Week 1): Pt will perform stand pivot transfer with RW at mod A level  PT Short Term Goal 4 - Progress (Week 1): Met PT Short Term Goal 5  (Week 1): Pt will ambulate with RW x 20' with max A of single therapist (+2 for chair follow) PT Short Term Goal 5 - Progress (Week 1): Met Week 2:  PT Short Term Goal 1 (Week 2): =LTG's due to ELOS   Skilled Therapeutic Interventions/Progress Updates:   Pt received sitting in recliner, agreeable to therapy session.  Pt ambulated x 120' to therapy gym (with single rest break, seated) at close S level with continued cues for upright posture, increased L step length and increased smoothness of L step to protect RLE.  Once in therapy gym, sat at nustep to perform seated therex x 8 mins with BUEs/LEs at level 4 resistance to increase overall strengthening and endurance and to increase R LE ROM.  Transferred back to w/c and ended session with standing with RW and tapping LLE to 2" step to increase WB and weight shift to the RLE.  Tolerated well with cues for increased control of step with increased L hip and knee flex and not to "drag" LLE off of step.  Pt self propelled part of way back to room x 65' at S level.  Ambulated to restroom with RW at close S level and performed all adjusting clothing at S level.  Left on bedside commode over toilet and RN notified.  Sister in room to provide supervision.    Therapy Documentation Precautions:  Precautions Precautions: Fall Precaution Comments: h/o falls Restrictions Weight Bearing Restrictions: Yes RLE Weight Bearing:  Weight bearing as tolerated   Vital Signs: Therapy Vitals Temp: 98.2 F (36.8 C) Temp Source: Oral Pulse Rate: 70 Resp: 20 BP: (!) 150/61 mmHg Patient Position (if appropriate): Lying Oxygen Therapy SpO2: 92 % O2 Device: Not Delivered Pain: Pain Assessment Pain Score: 4  Pain Type: Surgical pain Pain Location: Hip Pain Orientation: Right Pain Descriptors / Indicators: Aching Pain Frequency: Intermittent Pain Intervention(s): Medication (See eMAR)  See FIM for current functional status  Therapy/Group: Individual  Therapy  Denice Bors 06/24/2014, 8:10 AM

## 2014-06-24 NOTE — Progress Notes (Signed)
Occupational Therapy Session Note  Patient Details  Name: Makalia Bare MRN: [id number] Date of Birth: 1928-12-30  Today's Date: 06/24/2014 OT Individual Time:  -   [phone]  (60 min)  1st session      Short Term Goals: Week 1:  OT Short Term Goal 1 (Week 1): Pt will perform tub transfer onto TTB with Max A of 1 in order to decrease assistance with functional transfer. OT Short Term Goal 1 - Progress (Week 1): Met OT Short Term Goal 2 (Week 1): Pt will perform toilet transfer with Max A of 1 in order to decrease assistance with functional transfer. OT Short Term Goal 2 - Progress (Week 1): Met OT Short Term Goal 3 (Week 1): Pt will perform 1/3 tasks of toileting in order to decrease assistance for functional tasks.  OT Short Term Goal 3 - Progress (Week 1): Met OT Short Term Goal 4 (Week 1): Pt will perform LB dressing with Max A of 1 in order to decrease assistance with self care. OT Short Term Goal 4 - Progress (Week 1): Met  Skilled Therapeutic Interventions/Progress Updates:    1st session:   Engaged in therapeutic bathing and dressing at shower level.  Addressed functional mobility, transfers, AE use.  Pt ambulated with RW to toilet with minimal assist.  She used good eccentric control with stand to sit.  Performed toileting at SBA level.  Ambulated to shower and transferred to shower seat with minimal assist.  Used reacher for washing feet.  Transferred out to recliner using RW with minimal assist.  Dressed slef using reacher and shoe horn with minimal assist with right shoe.    Therapy Documentation Precautions:  Precautions Precautions: Fall Precaution Comments: h/o falls Restrictions Weight Bearing Restrictions: Yes RLE Weight Bearing: Weight bearing as tolerated     Pain:  2/10  Right leg 1st session    Other Treatments:    2nd session:  Time:1400-1515  (75 min) Pain:none,; fatigued at end of session Individual session Engaged in functional mobility, transfers,  simple home management tasks, dynamic reaching in kitchen cabinets.  Pt. Ambulated with RW to ADL apartment.  Had 5 minute seated rest break.  Discussed tub and best possible DME. Ambulated to bathroom with faucets on left side.  Practice transfer with tub bench.  Was max assist with lifting right leg over side of tub>  Pt needed verbal cues to scoot bottom to rear of bench and plant one leg on floor to increase stability.  Did sit to stand in tub with grab bars with min verbal cues and SBA.  Pt. Needed assistance getting right leg to outside of tub.  Ambulated to kitchen and engaged in ambulation to simulate cooking.  Pt reached in overhead cabinets with mod assistance.  Her right shoulder is limited.  She can reach to first shelf but not beyond. Reacher assisted with light items but not much.  Pt. Reached into lower cabinets with good accessibility on top shelf but not below.  Family is available to get things out if needed. Pt. Was SBA with sit to stand from kitchen chair.   Ambulated back to room with increased tiredness.     See FIM for current functional status  Therapy/Group: Individual Therapy  Lisa Roca 06/24/2014, 8:16 AM

## 2014-06-24 NOTE — Plan of Care (Signed)
Problem: RH BLADDER ELIMINATION Goal: RH STG MANAGE BLADDER WITH ASSISTANCE STG Manage Bladder With Assistance. Mod I  Outcome: Progressing  Problem: RH SKIN INTEGRITY Goal: RH STG MAINTAIN SKIN INTEGRITY WITH ASSISTANCE STG Maintain Skin Integrity With Assistance. Min A  Outcome: Progressing  Problem: RH PAIN MANAGEMENT Goal: RH STG PAIN MANAGED AT OR BELOW PT'S PAIN GOAL <5  Outcome: Progressing

## 2014-06-24 NOTE — Progress Notes (Signed)
[facility] PHYSICAL MEDICINE & REHABILITATION     PROGRESS NOTE    Subjective/Complaints: No issues overnite, slept poorly due to pain, tried steps in PT yesterday "I overdid it"  Review of Systems - Negative except RIght hip pain with PT activities  Objective: Vital Signs: Blood pressure 144/60, pulse 70, temperature 97.8 F (36.6 C), temperature source Oral, resp. rate 20, height 5\' 1"  (1.549 m), weight 92 kg (202 lb 13.2 oz), SpO2 93 %. No results found. No results for input(s): WBC, HGB, HCT, PLT in the last 72 hours. No results for input(s): NA, K, CL, GLUCOSE, BUN, CREATININE, CALCIUM in the last 72 hours.  Invalid input(s): CO CBG (last 3)  No results for input(s): GLUCAP in the last 72 hours.  Wt Readings from Last 3 Encounters:  06/16/14 92 kg (202 lb 13.2 oz)  06/12/14 89.812 kg (198 lb)    Physical Exam:  Constitutional: obese, no distress HENT: oral mucosa pink Head: Normocephalic.  Eyes: EOM are normal.  Neck: Normal range of motion. Neck supple. No thyromegaly present.  Cardiovascular: Normal rate and regular rhythm.  Respiratory: Effort normal and breath sounds normal. No respiratory distress.  GI: Soft. Bowel sounds are normal. She exhibits no distension.  Skin:  Right hip surgical site erythema no drainage, R lat and inf thigh CDI M/s: right shoulder ROM limited Neuro: alert and oriented. hoh 3 minus left deltoid, 4/5 right deltoid, 4/5 bilateral biceps triceps and grip 3- right hip flexion 3- knee extension for ankle dorsiflexion plantar flexion 4/5 in the left hip flexor and extensor ankle dorsi flexors and plantar fldexor Sensory intact to light touch bilateral lower extremity Psych: pleasant and appropriate  Assessment/Plan: 1. Functional deficits secondary to right subtrochanteric femur fracture which require 3+ hours per day of interdisciplinary therapy in a comprehensive inpatient rehab setting. Physiatrist is providing close team  supervision and 24 hour management of active medical problems listed below. Physiatrist and rehab team continue to assess barriers to discharge/monitor patient progress toward functional and medical goals. Discussed  D/C with pt and sister  FIM: FIM - Bathing Bathing Steps Patient Completed: Chest, Right Arm, Left Arm, Abdomen, Front perineal area, Right upper leg, Left upper leg Bathing: 3: Mod-Patient completes 5-7 42f 10 parts or 50-74%  FIM - Upper Body Dressing/Undressing Upper body dressing/undressing steps patient completed: Thread/unthread right bra strap, Thread/unthread right sleeve of pullover shirt/dresss, Thread/unthread left sleeve of pullover shirt/dress, Put head through opening of pull over shirt/dress, Pull shirt over trunk, Thread/unthread left bra strap Upper body dressing/undressing: 4: Min-Patient completed 75 plus % of tasks FIM - Lower Body Dressing/Undressing Lower body dressing/undressing steps patient completed: Thread/unthread right underwear leg, Thread/unthread left underwear leg, Thread/unthread right pants leg, Thread/unthread left pants leg, Pull pants up/down Lower body dressing/undressing: 3: Mod-Patient completed 50-74% of tasks  FIM - Toileting Toileting steps completed by patient: Adjust clothing after toileting Toileting: 3: Mod-Patient completed 2 of 3 steps  FIM - Radio producer Devices: Engineer, civil (consulting), Insurance account manager Transfers: 3-To toilet/BSC: Mod A (lift or lower assist), 3-From toilet/BSC: Mod A (lift or lower assist)  FIM - Control and instrumentation engineer Devices: Walker, Arm rests Bed/Chair Transfer: 4: Supine > Sit: Min A (steadying Pt. > 75%/lift 1 leg), 3: Sit > Supine: Mod A (lifting assist/Pt. 50-74%/lift 2 legs), 3: Bed > Chair or W/C: Mod A (lift or lower assist), 3: Chair or W/C > Bed: Mod A (lift or lower assist)  FIM - Locomotion:  Wheelchair Distance: 110 Locomotion: Wheelchair: 2:  Travels 50 - 149 ft with minimal assistance (Pt.>75%) FIM - Locomotion: Ambulation Locomotion: Ambulation Assistive Devices: Administrator Ambulation/Gait Assistance: 4: Min guard Locomotion: Ambulation: 2: Travels 50 - 149 ft with minimal assistance (Pt.>75%)  Comprehension Comprehension Mode: Auditory Comprehension: 6-Follows complex conversation/direction: With extra time/assistive device  Expression Expression Mode: Verbal Expression: 5-Expresses complex 90% of the time/cues < 10% of the time  Social Interaction Social Interaction: 5-Interacts appropriately 90% of the time - Needs monitoring or encouragement for participation or interaction.  Problem Solving Problem Solving: 4-Solves basic 75 - 89% of the time/requires cueing 10 - 24% of the time  Memory Memory: 4-Recognizes or recalls 75 - 89% of the time/requires cueing 10 - 24% of the time  Medical Problem List and Plan: 1. Functional deficits secondary to right subtrochanteric femur fracture. Status post IM nailing 06/13/2014. Weightbearing as tolerated 2. 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   3. Pain Management: Hydrocodone as needed.encouraged pt to take 2 tabs  Monitor with increased mobility 4. Acute blood loss anemia. Follow-up CBC 5. Neuropsych: This patient is capable of making decisions on her own behalf. 6. Skin/Wound Care: routine skin checks, start abx for superficial celluilitis R thigh wound 7. Fluids/Electrolytes/Nutrition: follow-up chemistries appropriate today. Strict I and O's 8.Hypertension. No present antihypertensive medication. Patient on Norvasc 2.5 mg twice a day prior to admission and Diovan 320-25 milligrams daily.Monitor with increased mobility and resume antihypertensive medications as needed 9.Overactive Bladder. Ditropan 5 mg daily. Checking pvr's 10.GERD. Protonix 11. Left shoulder contractures secondary to history of  severe osteoarthritis and total shoulder arthroplasty LOS (Days) 8 A FACE TO FACE EVALUATION WAS PERFORMED   Melissa Lambert 06/24/2014 7:16 AM

## 2014-06-24 NOTE — Progress Notes (Signed)
ANTICOAGULATION CONSULT NOTE - Follow Up Consult  Pharmacy Consult for coumadin Indication: hx of PE and protein C & S deficiencies  Allergies  Allergen Reactions  . Adhesive [Tape] Itching  . Penicillins Itching, Swelling and Rash    Streptomycin also  . Tuberculin Tests Itching, Swelling and Rash    Patient Measurements: Height: 5\' 1"  (154.9 cm) Weight: 202 lb 13.2 oz (92 kg) IBW/kg (Calculated) : 47.8 Heparin Dosing Weight:   Vital Signs: Temp: 98.2 F (36.8 C) (11/13 0500) Temp Source: Oral (11/13 0500) BP: 150/61 mmHg (11/13 0500) Pulse Rate: 70 (11/13 0500)  Labs:  Recent Labs  06/22/14 0800 06/23/14 0355 06/24/14 0535  LABPROT 29.3* 32.4* 32.1*  INR 2.75* 3.12* 3.09*    Estimated Creatinine Clearance: 55.1 mL/min (by C-G formula based on Cr of 0.62).   Medications:  Scheduled:  . calcium-vitamin D  1 tablet Oral BID  . docusate sodium  100 mg Oral BID  . doxycycline  100 mg Oral Q12H  . loratadine  10 mg Oral Daily  . oxybutynin  5 mg Oral Daily  . pantoprazole  40 mg Oral Daily  . senna  2 tablet Oral BID  . triamcinolone   Topical BID  . Warfarin - Pharmacist Dosing Inpatient   Does not apply q1800   Infusions:    Assessment: 80 yo female with hx of PE and protein C & S deficiencies is currently on slightly supratherapeutic coumadin.  INR today is 3.09.  On day 4 of doxycyline which may affect INR. Goal of Therapy:  INR 2-3 Monitor platelets by anticoagulation protocol: Yes   Plan:  - d/c standing order of coumadin - coumadin 7.5mg  po x1 - INR in am  Axton Cihlar, Tsz-Yin 06/24/2014,8:22 AM

## 2014-06-25 ENCOUNTER — Inpatient Hospital Stay (HOSPITAL_COMMUNITY): Payer: Medicare Other | Admitting: Physical Therapy

## 2014-06-25 ENCOUNTER — Inpatient Hospital Stay (HOSPITAL_COMMUNITY): Payer: Medicare Other | Admitting: *Deleted

## 2014-06-25 DIAGNOSIS — S7291XD Unspecified fracture of right femur, subsequent encounter for closed fracture with routine healing: Secondary | ICD-10-CM

## 2014-06-25 LAB — PROTIME-INR
INR: 3.21 — ABNORMAL HIGH (ref 0.00–1.49)
Prothrombin Time: 33.1 seconds — ABNORMAL HIGH (ref 11.6–15.2)

## 2014-06-25 MED ORDER — WARFARIN SODIUM 3 MG PO TABS
3.0000 mg | ORAL_TABLET | Freq: Once | ORAL | Status: AC
Start: 2014-06-25 — End: 2014-06-25
  Administered 2014-06-25: 3 mg via ORAL
  Filled 2014-06-25: qty 1

## 2014-06-25 NOTE — Progress Notes (Signed)
ANTICOAGULATION CONSULT NOTE - Follow Up Consult  Pharmacy Consult for Coumadin Indication: Protein C/S def.  Allergies  Allergen Reactions  . Adhesive [Tape] Itching  . Penicillins Itching, Swelling and Rash    Streptomycin also  . Tuberculin Tests Itching, Swelling and Rash    Patient Measurements: Height: 5\' 1"  (154.9 cm) Weight: 202 lb 13.2 oz (92 kg) IBW/kg (Calculated) : 47.8 Heparin Dosing Weight:  Vital Signs: Temp: 98.4 F (36.9 C) (11/14 0642) Temp Source: Oral (11/14 0642) BP: 146/63 mmHg (11/14 0642) Pulse Rate: 71 (11/14 0642)  Labs:  Recent Labs  06/23/14 0355 06/24/14 0535 06/25/14 0458  LABPROT 32.4* 32.1* 33.1*  INR 3.12* 3.09* 3.21*    Estimated Creatinine Clearance: 55.1 mL/min (by C-G formula based on Cr of 0.62).    Assessment: 72 YOF continues on Coumadin for history of PE and protein C and S deficiencies. Femur fx S/p right hip IM nail 11/2.  Anticoagulation:Coumadin 7.5 mg daily except 10 mg Tue + Sat PTA (admit INR 1.67) for h/o PE several years ago. Protein C & S deficiencies; INR 3.21  Infectious Disease - afb, WBC 6.5. Doxycycline (Day 5) for superficial cellulitis of R thigh wound. No culture  Cardiovascular - hx HTN - VSS (home med were held for soft BP)  Endocrinology - no hx DM  Gastrointestinal / Nutrition - CMD - Oscal D, docusate, senokot, po ppi (Prilosec pta)  Neurology - prn Norco  Nephrology - SCr 0.62, Ditropan XL  Pulmonary - Asthma - [place] >> RA - loratidine (for Zyrtec)  Hematology / Oncology - H/o PE 2/2 Protein S/C deficiency; Hgb 9.1, PLTC 222, stable  PTA Medication Issues: off amlodipine, Valsartan-HCTZ  Best Practices: SCDs, warf, po ppi   Goal of Therapy:  INR 2-3 Monitor platelets by anticoagulation protocol: Yes   Plan:  Coumadin (1/2 dose) 3mg  po x 1 today.   Abryana Lykens S. Alford Highland, PharmD, [place] Clinical Staff Pharmacist Pager [phone]  Eilene Ghazi Stillinger 06/25/2014,1:28  PM

## 2014-06-25 NOTE — Progress Notes (Signed)
Physical Therapy Session Note  Patient Details  Name: Melissa Lambert MRN: [id number] Date of Birth: 1929/01/31  Today's Date: 06/25/2014 PT Individual Time: 1137-1210 PT Individual Time Calculation (min): 33 min   Short Term Goals: Week 2:  PT Short Term Goal 1 (Week 2): =LTG's due to ELOS   Skilled Therapeutic Interventions/Progress Updates:   Pt received sitting in recliner, sister Stanton Kidney present. Session focused on hands-on family training with Stanton Kidney due to patient's sister requesting to be cleared to assist patient in room. Pt transferred sit > stand from recliner with supervision and ambulated using RW x 100 ft with sister providing min guard assist. Although pt did not need physical assistance this session, pt/family educated on safest and most appropriate ways to assist with transfers and gait if assist is needed or if patient has LOB. Sister and patient verbalized understanding. Sister departing. Pt performed seated LAQ and standing RLE hip flexion using RW x 10 each. Gait training using RW x 100 ft with supervision back to room and patient left sitting in recliner with all needs within reach, RN present.   Therapy Documentation Precautions:  Precautions Precautions: Fall Precaution Comments: h/o falls Restrictions Weight Bearing Restrictions: Yes RLE Weight Bearing: Weight bearing as tolerated Pain: Pain Score: unrated Pain Type: Surgical pain Pain Location: Hip Pain Orientation: Right Pain Descriptors / Indicators: Aching, sore Pain Onset: with activity Pain Intervention(s): Medication (See eMAR)  See FIM for current functional status  Therapy/Group: Individual Therapy  Laretta Alstrom 06/25/2014, 12:19 PM

## 2014-06-25 NOTE — Progress Notes (Signed)
[facility] PHYSICAL MEDICINE & REHABILITATION     PROGRESS NOTE    Subjective/Complaints: No issues overnite, slept well "they have good therapy here"  Review of Systems - Negative except RIght hip pain with PT activities  Objective: Vital Signs: Blood pressure 146/63, pulse 71, temperature 98.4 F (36.9 C), temperature source Oral, resp. rate 18, height 5\' 1"  (1.549 m), weight 92 kg (202 lb 13.2 oz), SpO2 91 %. No results found. No results for input(s): WBC, HGB, HCT, PLT in the last 72 hours. No results for input(s): NA, K, CL, GLUCOSE, BUN, CREATININE, CALCIUM in the last 72 hours.  Invalid input(s): CO CBG (last 3)  No results for input(s): GLUCAP in the last 72 hours.  Wt Readings from Last 3 Encounters:  06/16/14 92 kg (202 lb 13.2 oz)  06/12/14 89.812 kg (198 lb)    Physical Exam:  Constitutional: obese, no distress HENT: oral mucosa pink Head: Normocephalic.  Eyes: EOM are normal.  Neck: Normal range of motion. Neck supple. No thyromegaly present.  Cardiovascular: Normal rate and regular rhythm.  Respiratory: Effort normal and breath sounds normal. No respiratory distress.  GI: Soft. Bowel sounds are normal. She exhibits no distension.  Skin:  Right hip surgical site erythema no drainage, R lat and inf thigh CDI M/s: right shoulder ROM limited Neuro: alert and oriented. hoh 3 minus left deltoid, 4/5 right deltoid, 4/5 bilateral biceps triceps and grip 3- right hip flexion 3- knee extension for ankle dorsiflexion plantar flexion 4/5 in the left hip flexor and extensor ankle dorsi flexors and plantar fldexor Sensory intact to light touch bilateral lower extremity Psych: pleasant and appropriate  Assessment/Plan: 1. Functional deficits secondary to right subtrochanteric femur fracture which require 3+ hours per day of interdisciplinary therapy in a comprehensive inpatient rehab setting. Physiatrist is providing close team supervision and 24 hour  management of active medical problems listed below. Physiatrist and rehab team continue to assess barriers to discharge/monitor patient progress toward functional and medical goals.   FIM: FIM - Bathing Bathing Steps Patient Completed: Chest, Right Arm, Left Arm, Abdomen, Front perineal area, Right upper leg, Left upper leg Bathing: 3: Mod-Patient completes 5-7 3f 10 parts or 50-74%  FIM - Upper Body Dressing/Undressing Upper body dressing/undressing steps patient completed: Thread/unthread right bra strap, Thread/unthread right sleeve of pullover shirt/dresss, Thread/unthread left sleeve of pullover shirt/dress, Put head through opening of pull over shirt/dress, Pull shirt over trunk, Thread/unthread left bra strap Upper body dressing/undressing: 4: Min-Patient completed 75 plus % of tasks FIM - Lower Body Dressing/Undressing Lower body dressing/undressing steps patient completed: Thread/unthread right underwear leg, Thread/unthread left underwear leg, Thread/unthread right pants leg, Thread/unthread left pants leg, Pull pants up/down, Pull underwear up/down Lower body dressing/undressing: 4: Min-Patient completed 75 plus % of tasks  FIM - Toileting Toileting steps completed by patient: Adjust clothing after toileting Toileting: 3: Mod-Patient completed 2 of 3 steps  FIM - Radio producer Devices: Bedside commode, Insurance account manager Transfers: 3-To toilet/BSC: Mod A (lift or lower assist), 3-From toilet/BSC: Mod A (lift or lower assist)  FIM - Control and instrumentation engineer Devices: Walker, Arm rests Bed/Chair Transfer: 4: Bed > Chair or W/C: Min A (steadying Pt. > 75%)  FIM - Locomotion: Wheelchair Distance: 110 Locomotion: Wheelchair: 2: Travels 50 - 149 ft with minimal assistance (Pt.>75%) FIM - Locomotion: Ambulation Locomotion: Ambulation Assistive Devices: Administrator Ambulation/Gait Assistance: 4: Min guard Locomotion: Ambulation:  2: Travels 50 - 149 ft with minimal  assistance (Pt.>75%)  Comprehension Comprehension Mode: Auditory Comprehension: 6-Follows complex conversation/direction: With extra time/assistive device  Expression Expression Mode: Verbal Expression: 5-Expresses complex 90% of the time/cues < 10% of the time  Social Interaction Social Interaction: 5-Interacts appropriately 90% of the time - Needs monitoring or encouragement for participation or interaction.  Problem Solving Problem Solving: 4-Solves basic 75 - 89% of the time/requires cueing 10 - 24% of the time  Memory Memory: 4-Recognizes or recalls 75 - 89% of the time/requires cueing 10 - 24% of the time  Medical Problem List and Plan: 1. Functional deficits secondary to right subtrochanteric femur fracture. Status post IM nailing 06/13/2014. Weightbearing as tolerated 2. 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   3. Pain Management: Hydrocodone as needed.encouraged pt to take 2 tabs  Monitor with increased mobility 4. Acute blood loss anemia. hgb have been stable 5. Neuropsych: This patient is capable of making decisions on her own behalf. 6. Skin/Wound Care: routine skin checks, start abx for superficial celluilitis R thigh wound 7. Fluids/Electrolytes/Nutrition: follow-up chemistries appropriate today. Strict I and O's 8.Hypertension. No present antihypertensive medication. Patient on Norvasc 2.5 mg twice a day prior to admission and Diovan 320-25 milligrams daily.Monitor with increased mobility and resume antihypertensive medications as needed 9.Overactive Bladder. Ditropan 5 mg daily. Checking pvr's 10.GERD. Protonix 11. Left shoulder contractures secondary to history of severe osteoarthritis and total shoulder arthroplasty LOS (Days) 9 A FACE TO FACE EVALUATION WAS PERFORMED   Charlett Blake 06/25/2014 7:32 AM

## 2014-06-25 NOTE — Plan of Care (Signed)
Problem: RH BLADDER ELIMINATION Goal: RH STG MANAGE BLADDER WITH ASSISTANCE STG Manage Bladder With Assistance. Mod I  Outcome: Progressing  Problem: RH SKIN INTEGRITY Goal: RH STG MAINTAIN SKIN INTEGRITY WITH ASSISTANCE STG Maintain Skin Integrity With Assistance. Min A  Outcome: Progressing  Problem: RH PAIN MANAGEMENT Goal: RH STG PAIN MANAGED AT OR BELOW PT'S PAIN GOAL <5  Outcome: Progressing

## 2014-06-25 NOTE — Progress Notes (Signed)
Occupational Therapy Session Note  Patient Details  Name: Melissa Lambert MRN: [id number] Date of Birth: 1928/10/26  Today's Date: 06/25/2014 OT Individual Time: 1530-1600 OT Individual Time Calculation (min): 30 min    Short Term Goals: Week 1:  OT Short Term Goal 1 (Week 1): Pt will perform tub transfer onto TTB with Max A of 1 in order to decrease assistance with functional transfer. OT Short Term Goal 1 - Progress (Week 1): Met OT Short Term Goal 2 (Week 1): Pt will perform toilet transfer with Max A of 1 in order to decrease assistance with functional transfer. OT Short Term Goal 2 - Progress (Week 1): Met OT Short Term Goal 3 (Week 1): Pt will perform 1/3 tasks of toileting in order to decrease assistance for functional tasks.  OT Short Term Goal 3 - Progress (Week 1): Met OT Short Term Goal 4 (Week 1): Pt will perform LB dressing with Max A of 1 in order to decrease assistance with self care. OT Short Term Goal 4 - Progress (Week 1): Met Week 2:  OT Short Term Goal 1 (Week 2): Continue to progress towards long term goals based on estimated LOS  Skilled Therapeutic Interventions/Progress Updates:    engaged in functional mobility and tub transfers.  Pt. Transferred from wc to tub bench using grab bar and leg lifter with mod assist.  She is unable to lift operated leg over the side of the tub.  Sister who lives close by was observing treatment.  Pt could benefit from grab bar placed vertically along short back wall to assist her with transfer.  She may need minimal assist getting leg over side of tub.  Ambulated back to room with RW and Minimal assist.  Left pt in recliner with safety needs  and call bell,phone within reach.    Therapy Documentation Precautions:  Precautions Precautions: Fall Precaution Comments: h/o falls Restrictions Weight Bearing Restrictions: Yes RLE Weight Bearing: Weight bearing as tolerated      Pain: Pain Assessment Pain Assessment: 0-10 Pain Score:  0-No pain Pain Type: Surgical pain Pain Location: Hip Pain Orientation: Right Pain Descriptors / Indicators: Aching Pain Frequency: Intermittent Pain Onset: Gradual Pain Intervention(s): Medication (See eMAR)      See FIM for current functional status  Therapy/Group: Individual Therapy  Lisa Roca 06/25/2014, 6:32 PM

## 2014-06-26 ENCOUNTER — Inpatient Hospital Stay (HOSPITAL_COMMUNITY): Payer: Medicare Other | Admitting: Rehabilitation

## 2014-06-26 LAB — PROTIME-INR
INR: 3.09 — ABNORMAL HIGH (ref 0.00–1.49)
Prothrombin Time: 32.1 seconds — ABNORMAL HIGH (ref 11.6–15.2)

## 2014-06-26 MED ORDER — WARFARIN SODIUM 3 MG PO TABS
3.0000 mg | ORAL_TABLET | Freq: Once | ORAL | Status: AC
  Administered 2014-06-26: 3 mg via ORAL
  Filled 2014-06-26: qty 1

## 2014-06-26 NOTE — Progress Notes (Signed)
Physical Therapy Session Note  Patient Details  Name: Melissa Lambert MRN: [id number] Date of Birth: 1929/04/24  Today's Date: 06/26/2014 PT Individual Time: 0900-1000 PT Individual Time Calculation (min): 60 min   Short Term Goals: Week 2:  PT Short Term Goal 1 (Week 2): =LTG's due to ELOS   Skilled Therapeutic Interventions/Progress Updates:   Pt received sitting in recliner in room, agreeable to therapy session.  Sister, Melissa Lambert present for family training and lengthy discussion of practicing getting in/out of her Lucianne Lei as she will have to do when she leaves.  Sister states that Lucianne Lei seat to floor height is 32".  Discussed that this would not be safe for pt at this time.  Sister then states that she has metal step that is 6" that pt can step up onto to make it easier to get in.  Voiced importance of practicing during session prior to D/C.  Will have sister go to [place] tomorrow in am to pull up to valet parking area in order to practice.  Both verbalized understanding.  Pt ambulated to/from therapy gym with single seated rest break.  Educated sister on how to assist pt for safety, as she is not needing physical assist, but to provide min/guard for safety.  Pts sister returned demonstration and ambulated with pt in hallway and assisted with sit<>stands.  Provided cues for sister to assist from side of pt instead of in front for safety and in case of needing to provide light assist for forward weight shift.  Once in therapy gym, performed seated nustep x 10 mins at level 4 resistance with BUEs/LEs.  Performed with only single rest break for overall strengthening and endurance.  Pt assisted back to room and sister assisted pt back to recliner.  Okayed sister to assist pt in room to/from restroom.  RN made aware.    Therapy Documentation Precautions:  Precautions Precautions: Fall Precaution Comments: h/o falls Restrictions Weight Bearing Restrictions: Yes RLE Weight Bearing: Weight bearing as  tolerated   Vital Signs: Therapy Vitals Temp: 97.9 F (36.6 C) Temp Source: Oral Pulse Rate: 63 Resp: 18 BP: (!) 146/69 mmHg Patient Position (if appropriate): Lying Oxygen Therapy SpO2: 91 % O2 Device: Not Delivered Pain: Pain Assessment Pain Assessment: 0-10 Pain Score: 5  Pain Type: Surgical pain Pain Location: Hip Pain Orientation: Right Pain Descriptors / Indicators: Aching Pain Frequency: Intermittent Pain Onset: With Breastfeeding Pain Intervention(s): Medication (See eMAR) Multiple Pain Sites: No  See FIM for current functional status  Therapy/Group: Individual Therapy  Denice Bors 06/26/2014, 9:43 AM

## 2014-06-26 NOTE — Progress Notes (Signed)
ANTICOAGULATION CONSULT NOTE - Follow Up Consult  Pharmacy Consult for Coumadin Indication: Protein C/S def.  Allergies  Allergen Reactions  . Adhesive [Tape] Itching  . Penicillins Itching, Swelling and Rash    Streptomycin also  . Tuberculin Tests Itching, Swelling and Rash    Patient Measurements: Height: 5\' 1"  (154.9 cm) Weight: 202 lb 13.2 oz (92 kg) IBW/kg (Calculated) : 47.8 Heparin Dosing Weight:  Vital Signs: Temp: 97.9 F (36.6 C) (11/15 0625) Temp Source: Oral (11/15 0625) BP: 146/69 mmHg (11/15 0625) Pulse Rate: 63 (11/15 0625)  Labs:  Recent Labs  06/24/14 0535 06/25/14 0458 06/26/14 0622  LABPROT 32.1* 33.1* 32.1*  INR 3.09* 3.21* 3.09*    Estimated Creatinine Clearance: 55.1 mL/min (by C-G formula based on Cr of 0.62).    Assessment: 81 YOF continues on Coumadin for history of PE and protein C and S deficiencies. Femur fx S/p right hip IM nail 11/2.  Anticoagulation:Coumadin 7.5 mg daily except 10 mg Tue + Sat PTA (admit INR 1.67) for h/o PE several years ago. Protein C & S deficiencies; INR 3.09  Infectious Disease - afb, WBC 6.5. Doxycycline (Day 6) for superficial cellulitis of R thigh wound. 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   Avanthika Dehnert S. Alford Highland, PharmD, [facility] Clinical Staff Pharmacist Pager [phone]  [place], [place] 06/26/2014,10:52 AM

## 2014-06-26 NOTE — Plan of Care (Signed)
Problem: RH Bed Mobility Goal: LTG Patient will perform bed mobility with assist (PT) LTG: Patient will perform bed mobility with assistance, with/without cues (PT).  Outcome: Not Applicable Date Met:  77/03/40 Pt planning to sleep in lift chair when she gets home, therefore will D/C bed mobility goal at this time.

## 2014-06-26 NOTE — Progress Notes (Signed)
[facility] PHYSICAL MEDICINE & REHABILITATION     PROGRESS NOTE    Subjective/Complaints: Notes some bruising on hip, pain during therapy but participating well  Review of Systems - Negative except RIght hip pain with PT activities  Objective: Vital Signs: Blood pressure 146/69, pulse 63, temperature 97.9 F (36.6 C), temperature source Oral, resp. rate 18, height 5\' 1"  (1.549 m), weight 92 kg (202 lb 13.2 oz), SpO2 91 %. No results found. No results for input(s): WBC, HGB, HCT, PLT in the last 72 hours. No results for input(s): NA, K, CL, GLUCOSE, BUN, CREATININE, CALCIUM in the last 72 hours.  Invalid input(s): CO CBG (last 3)  No results for input(s): GLUCAP in the last 72 hours.  Wt Readings from Last 3 Encounters:  06/16/14 92 kg (202 lb 13.2 oz)  06/12/14 89.812 kg (198 lb)    Physical Exam:  Constitutional: obese, no distress HENT: oral mucosa pink Head: Normocephalic.  Eyes: EOM are normal.  Neck: Normal range of motion. Neck supple. No thyromegaly present.  Cardiovascular: Normal rate and regular rhythm.  Respiratory: Effort normal and breath sounds normal. No respiratory distress.  GI: Soft. Bowel sounds are normal. She exhibits no distension.  Skin:  Right hip surgical site erythema no drainage, R lat and inf thigh CDI M/s: right shoulder ROM limited Neuro: alert and oriented. hoh 3 minus left deltoid, 4/5 right deltoid, 4/5 bilateral biceps triceps and grip 3- right hip flexion 3- knee extension for ankle dorsiflexion plantar flexion 4/5 in the left hip flexor and extensor ankle dorsi flexors and plantar fldexor Sensory intact to light touch bilateral lower extremity Psych: pleasant and appropriate  Assessment/Plan: 1. Functional deficits secondary to right subtrochanteric femur fracture which require 3+ hours per day of interdisciplinary therapy in a comprehensive inpatient rehab setting. Physiatrist is providing close team supervision and 24  hour management of active medical problems listed below. Physiatrist and rehab team continue to assess barriers to discharge/monitor patient progress toward functional and medical goals.   FIM: FIM - Bathing Bathing Steps Patient Completed: Chest, Right Arm, Left Arm, Abdomen, Front perineal area, Left upper leg, Right upper leg Bathing: 3: Mod-Patient completes 5-7 71f 10 parts or 50-74%  FIM - Upper Body Dressing/Undressing Upper body dressing/undressing steps patient completed: Thread/unthread right bra strap, Thread/unthread left bra strap, Hook/unhook bra, Thread/unthread right sleeve of pullover shirt/dresss, Thread/unthread left sleeve of pullover shirt/dress, Put head through opening of pull over shirt/dress, Pull shirt over trunk Upper body dressing/undressing: 4: Min-Patient completed 75 plus % of tasks FIM - Lower Body Dressing/Undressing Lower body dressing/undressing steps patient completed: Thread/unthread right underwear leg, Thread/unthread left underwear leg, Pull underwear up/down, Thread/unthread right pants leg, Thread/unthread left pants leg, Pull pants up/down Lower body dressing/undressing: 4: Min-Patient completed 75 plus % of tasks  FIM - Toileting Toileting steps completed by patient: Adjust clothing prior to toileting, Performs perineal hygiene Toileting Assistive Devices: Grab bar or rail for support Toileting: 3: Mod-Patient completed 2 of 3 steps  FIM - Radio producer Devices: Elevated toilet seat, Grab bars, Insurance account manager Transfers: 4-To toilet/BSC: Min A (steadying Pt. > 75%), 5-From toilet/BSC: Supervision (verbal cues/safety issues)  FIM - Control and instrumentation engineer Devices: Walker, Arm rests Bed/Chair Transfer: 5: Chair or W/C > Bed: Supervision (verbal cues/safety issues)  FIM - Locomotion: Wheelchair Distance: 110 Locomotion: Wheelchair: 0: Activity did not occur FIM - Locomotion:  Ambulation Locomotion: Ambulation Assistive Devices: Administrator Ambulation/Gait Assistance: 5: Supervision Locomotion:  Ambulation: 2: Travels 50 - 149 ft with supervision/safety issues  Comprehension Comprehension Mode: Auditory Comprehension: 6-Follows complex conversation/direction: With extra time/assistive device  Expression Expression Mode: Verbal Expression: 5-Expresses complex 90% of the time/cues < 10% of the time  Social Interaction Social Interaction: 5-Interacts appropriately 90% of the time - Needs monitoring or encouragement for participation or interaction.  Problem Solving Problem Solving: 4-Solves basic 75 - 89% of the time/requires cueing 10 - 24% of the time  Memory Memory: 4-Recognizes or recalls 75 - 89% of the time/requires cueing 10 - 24% of the time  Medical Problem List and Plan: 1. Functional deficits secondary to right subtrochanteric femur fracture. Status post IM nailing 06/13/2014. Weightbearing as tolerated 2. 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   3. Pain Management: Hydrocodone as needed.encouraged pt to take 2 tabs  Monitor with increased mobility 4. Acute blood loss anemia. hgb have been stable 5. Neuropsych: This patient is capable of making decisions on her own behalf. 6. Skin/Wound Care: routine skin checks, start abx for superficial celluilitis R thigh wound 7. Fluids/Electrolytes/Nutrition: follow-up chemistries appropriate today. Strict I and O's 8.Hypertension. No present antihypertensive medication. Patient on Norvasc 2.5 mg twice a day prior to admission and Diovan 320-25 milligrams daily.Monitor with increased mobility and resume antihypertensive medications as needed 9.Overactive Bladder. Ditropan 5 mg daily. Checking pvr's 10.GERD. Protonix 11. Left shoulder contractures secondary to history of severe osteoarthritis and total shoulder arthroplasty LOS (Days)  10 A FACE TO FACE EVALUATION WAS PERFORMED    Charlett Blake 06/26/2014 6:55 AM

## 2014-06-27 ENCOUNTER — Inpatient Hospital Stay (HOSPITAL_COMMUNITY): Payer: Medicare Other

## 2014-06-27 ENCOUNTER — Inpatient Hospital Stay (HOSPITAL_COMMUNITY): Payer: Medicare Other | Admitting: Rehabilitation

## 2014-06-27 DIAGNOSIS — T814XXD Infection following a procedure, subsequent encounter: Secondary | ICD-10-CM

## 2014-06-27 DIAGNOSIS — S72001G Fracture of unspecified part of neck of right femur, subsequent encounter for closed fracture with delayed healing: Secondary | ICD-10-CM

## 2014-06-27 LAB — BASIC METABOLIC PANEL
Anion gap: 12 (ref 5–15)
BUN: 14 mg/dL (ref 6–23)
CHLORIDE: 94 meq/L — AB (ref 96–112)
CO2: 24 mEq/L (ref 19–32)
Calcium: 9.3 mg/dL (ref 8.4–10.5)
Creatinine, Ser: 0.63 mg/dL (ref 0.50–1.10)
GFR calc non Af Amer: 81 mL/min — ABNORMAL LOW (ref >90)
Glucose, Bld: 112 mg/dL — ABNORMAL HIGH (ref 70–99)
Potassium: 4.1 mEq/L (ref 3.7–5.3)
SODIUM: 130 meq/L — AB (ref 137–147)

## 2014-06-27 LAB — CBC
HEMATOCRIT: 33.4 % — AB (ref 36.0–46.0)
HEMOGLOBIN: 10.9 g/dL — AB (ref 12.0–15.0)
MCH: 30.5 pg (ref 26.0–34.0)
MCHC: 32.6 g/dL (ref 30.0–36.0)
MCV: 93.6 fL (ref 78.0–100.0)
Platelets: 442 10*3/uL — ABNORMAL HIGH (ref 150–400)
RBC: 3.57 MIL/uL — ABNORMAL LOW (ref 3.87–5.11)
RDW: 15 % (ref 11.5–15.5)
WBC: 5.6 10*3/uL (ref 4.0–10.5)

## 2014-06-27 LAB — PROTIME-INR
INR: 2.91 — ABNORMAL HIGH (ref 0.00–1.49)
Prothrombin Time: 30.6 seconds — ABNORMAL HIGH (ref 11.6–15.2)

## 2014-06-27 MED ORDER — VANCOMYCIN HCL 10 G IV SOLR
1250.0000 mg | INTRAVENOUS | Status: DC
  Administered 2014-06-27 – 2014-06-29 (×3): 1250 mg via INTRAVENOUS
  Filled 2014-06-27 (×6): qty 1250

## 2014-06-27 MED ORDER — LEVOFLOXACIN IN D5W 500 MG/100ML IV SOLN
500.0000 mg | INTRAVENOUS | Status: DC
  Administered 2014-06-27 – 2014-06-29 (×3): 500 mg via INTRAVENOUS
  Filled 2014-06-27 (×5): qty 100

## 2014-06-27 MED ORDER — VANCOMYCIN HCL IN DEXTROSE 1-5 GM/200ML-% IV SOLN
1000.0000 mg | Freq: Once | INTRAVENOUS | Status: DC
  Filled 2014-06-27: qty 200

## 2014-06-27 MED ORDER — WARFARIN SODIUM 3 MG PO TABS
3.0000 mg | ORAL_TABLET | Freq: Once | ORAL | Status: AC
  Administered 2014-06-27: 3 mg via ORAL
  Filled 2014-06-27: qty 1

## 2014-06-27 NOTE — Progress Notes (Signed)
Physical Therapy Session Note  Patient Details  Name: Melissa Lambert MRN: [id number] Date of Birth: 1928/08/23  Today's Date: 06/27/2014 PT Individual Time: 1400-1505 PT Individual Time Calculation (min): 65 min   Short Term Goals: Week 2:  PT Short Term Goal 1 (Week 2): =LTG's due to ELOS   Skilled Therapeutic Interventions/Progress Updates:  Pain rated 1-2 R hip throughout session, even with activity. Gait training x 50' with RW, supervision; sit>< stand throughout session with extra time, supervision.  Attempted simulated van transfer to 32" high seat, using sturdy footstool from home; terminated due to pt's short stature and inability to boost up with LLE.  Gait up/down ramp and curb using RW, min> min/mod assist when descending curb.  Pt benefits from leading with L foot when ascending ramp.  [place] A exs for strengthening: standing - R hip abduction, R knee flexion, R hip extension with extended knee, calf raises/toe raises all done in standing 10 x 1 each. Standing bil heel cord stretch in standing on ramp with bil UE support. Pt needed cues for deep slow breathing with exertion.  Returned pt to room and pt transferred to recliner with supervision; all needs left within reach.    Therapy Documentation Precautions:  Precautions Precautions: Fall Precaution Comments: h/o falls Restrictions Weight Bearing Restrictions: Yes RLE Weight Bearing: Weight bearing as tolerated   Vital Signs: Therapy Vitals Temp: 97.6 F (36.4 C) Temp Source: Oral Pulse Rate: 87 Resp: 17 BP: (!) 107/55 mmHg Patient Position (if appropriate): Sitting Oxygen Therapy SpO2: 99 % O2 Device: Not Delivered   Locomotion : Ambulation Ambulation/Gait Assistance: 5: Supervision    See FIM for current functional status  Therapy/Group: Individual Therapy  Shanera Meske 06/27/2014, 4:32 PM

## 2014-06-27 NOTE — Progress Notes (Addendum)
ANTICOAGULATION CONSULT NOTE - Follow Up Consult  Pharmacy Consult for coumadin Indication: hx of PE and protein C & S deficiencies   Allergies  Allergen Reactions  . Adhesive [Tape] Itching  . Penicillins Itching, Swelling and Rash    Streptomycin also  . Tuberculin Tests Itching, Swelling and Rash    Patient Measurements: Height: 5\' 1"  (154.9 cm) Weight: 202 lb 13.2 oz (92 kg) IBW/kg (Calculated) : 47.8 Heparin Dosing Weight:   Vital Signs: Temp: 97.5 F (36.4 C) (11/16 0535) Temp Source: Oral (11/16 0535) BP: 128/55 mmHg (11/16 0535) Pulse Rate: 63 (11/16 0535)  Labs:  Recent Labs  06/25/14 0458 06/26/14 0622 06/27/14 0409  LABPROT 33.1* 32.1* 30.6*  INR 3.21* 3.09* 2.91*    Estimated Creatinine Clearance: 55.1 mL/min (by C-G formula based on Cr of 0.62).   Medications:  Scheduled:  . calcium-vitamin D  1 tablet Oral BID  . docusate sodium  100 mg Oral BID  . levofloxacin (LEVAQUIN) IV  500 mg Intravenous Q24H  . loratadine  10 mg Oral Daily  . oxybutynin  5 mg Oral Daily  . pantoprazole  40 mg Oral Daily  . senna  2 tablet Oral BID  . triamcinolone   Topical BID  . vancomycin  1,000 mg Intravenous Once  . Warfarin - Pharmacist Dosing Inpatient   Does not apply q1800   Infusions:    Assessment: 80 yo female with hx of PE and protein C and S deficiencies is currently on therapeutic coumadin.  INR is down to 2.91. Patient was on coumadin 7.5 mg daily except 10 mg on Tue + Sat prior to admission. Goal of Therapy:  INR 2-3 Monitor platelets by anticoagulation protocol: Yes   Plan:  - coumadin 3 mg po x1 - INR in am  Alvis Pulcini, Tsz-Yin 06/27/2014,8:18 AM

## 2014-06-27 NOTE — Progress Notes (Signed)
2 PRN vicodin given at 2303 for complaint of RLE pain. Reports feeling like there's a band squeezing around right thigh. Ice applied. At [place] complained of pain and inability to fall asleep, PRN tylenol and trazodone 50mg  given. Proximal most incision on RLE very red and draining. All 3 foam dressings changed to RLE. Up to [facility], SOB with transfer. Sister at bedside providing hands on care. Patrici Ranks A

## 2014-06-27 NOTE — Plan of Care (Signed)
Problem: RH BLADDER ELIMINATION Goal: RH STG MANAGE BLADDER WITH ASSISTANCE STG Manage Bladder With Assistance. Mod I  Outcome: Progressing     

## 2014-06-27 NOTE — Plan of Care (Signed)
Problem: RH BLADDER ELIMINATION Goal: RH STG MANAGE BLADDER WITH ASSISTANCE STG Manage Bladder With Assistance. Mod I  Outcome: Progressing  Problem: RH SKIN INTEGRITY Goal: RH STG MAINTAIN SKIN INTEGRITY WITH ASSISTANCE STG Maintain Skin Integrity With Assistance. Min A  Outcome: Progressing

## 2014-06-27 NOTE — Consult Note (Signed)
Reason for Consult:wound dehiscence  Referring Physician: Alysia Penna MD  Melissa Lambert is an 80 y.o. female.  HPI: Melissa Lambert underwent IM nail on 06/12/14 for a closed R inter-troch fx.  She did well post-operatively and was released for inpatient rehab. She has done well since sx, but was noted to have some drainage and purulence from her superior incision site. Staples have not been removed. She was started on doxycycline, but noted no change in wound appearance. She was subsequently switched to IV levaquin and vanc.  We are asked to eval today.  She states that she is feeling well and that her hip is not hurting her today. She states that she and the nurse noticed drainage coming from one of her incision sites early this morning when she was getting up to go to the bathroom. She otherwise has not noticed any problems or concerns, other than some mild discomfort when laying on the incisions. Patient denies any new HA, CP, SOB, N, V, fever, chills, changes in appetite, calf pain or swelling. Staples are still in place. She has been ambulatory and working well in rehab. She is still on coumadin.    Past Medical History  Diagnosis Date  . Hypertension   . Asthma   . PE (pulmonary embolism)   . Protein C deficiency   . Protein S deficiency     Past Surgical History  Procedure Laterality Date  . Femur im nail Right 06/12/2014    Procedure: INTRAMEDULLARY (IM) NAIL FEMORAL;  Surgeon: Wylene Simmer, MD;  Location: [place] [facility] [place];  Service: Orthopedics;  Laterality: Right;    History reviewed. No pertinent family history.  Social History:  reports that she has never smoked. She does not have any smokeless tobacco history on file. She reports that she does not drink alcohol or use illicit drugs.  Allergies:  Allergies  Allergen Reactions  . Adhesive [Tape] Itching  . Penicillins Itching, Swelling and Rash    Streptomycin also  . Tuberculin Tests Itching, Swelling and Rash     Medications: I have reviewed the patient's current medications.  Results for orders placed or performed during the hospital encounter of 06/16/14 (from the past 48 hour(s))  Protime-INR     Status: Abnormal   Collection Time: 06/26/14  6:22 AM  Result Value Ref Range   Prothrombin Time 32.1 (H) 11.6 - 15.2 seconds   INR 3.09 (H) 0.00 - 1.49  Protime-INR     Status: Abnormal   Collection Time: 06/27/14  4:09 AM  Result Value Ref Range   Prothrombin Time 30.6 (H) 11.6 - 15.2 seconds   INR 2.91 (H) 0.00 - 1.49  CBC     Status: Abnormal   Collection Time: 06/27/14  8:08 AM  Result Value Ref Range   WBC 5.6 4.0 - 10.5 K/uL   RBC 3.57 (L) 3.87 - 5.11 MIL/uL   Hemoglobin 10.9 (L) 12.0 - 15.0 g/dL   HCT 33.4 (L) 36.0 - 46.0 %   MCV 93.6 78.0 - 100.0 fL   MCH 30.5 26.0 - 34.0 pg   MCHC 32.6 30.0 - 36.0 g/dL   RDW 15.0 11.5 - 15.5 %   Platelets 442 (H) 150 - 400 K/uL  Basic metabolic panel     Status: Abnormal   Collection Time: 06/27/14  8:08 AM  Result Value Ref Range   Sodium 130 (L) 137 - 147 mEq/L   Potassium 4.1 3.7 - 5.3 mEq/L   Chloride 94 (L) 96 - 112  mEq/L   CO2 24 19 - 32 mEq/L   Glucose, Bld 112 (H) 70 - 99 mg/dL   BUN 14 6 - 23 mg/dL   Creatinine, Ser 0.63 0.50 - 1.10 mg/dL   Calcium 9.3 8.4 - 10.5 mg/dL   GFR calc non Af Amer 81 (L) >90 mL/min   GFR calc Af Amer >90 >90 mL/min    Comment: (NOTE) The eGFR has been calculated using the CKD EPI equation. This calculation has not been validated in all clinical situations. eGFR's persistently <90 mL/min signify possible Chronic Kidney Disease.    Anion gap 12 5 - 15    No results found.  ROS:  See above PE: WD/WN Caucasian female in nad. A and O x4. Mood and affect appropriate. EOMI. Respirations normal and unlabored. Abdomen soft and non-tender. Dressings removed, demonstrating erythema and mild tenderness to superior incision site. Mild serosanguinous drainage, no pus expressed from incision but bandage with  purulent matter; other two incisions without signs of infection or drainage, dressings C/D/I.  Staples still in.  NV intact with brisk capillary refill and 5/5 strength of toe extensors and flexors bilaterally. 5/5 knee extensors and flexors. Distal sensation intact bilaterally. No lymphadenopathy. No calf swelling or palpable cords.  Pt. Ambulatory with walker in room during examination.   Blood pressure 107/55, pulse 87, temperature 97.6 F (36.4 C), temperature source Oral, resp. rate 17, height _0  (1.549 m), weight 92 kg (202 lb 13.2 oz), SpO2 99 %.   Assessment/Plan: -Post-surgical wound dehiscence of R hip: superior wound is slightly erythematous and with some drainage/ purulence; other 2 incisions without signs of infection. Current IV ABX regimen with good coverage.  Staples removed today from all three incisions and covered with mepitel dressings.  Dressings can be changed in 2-3 days.  Continue to monitor for signs and symptoms of infection. This will likely respond well to ABX. Continue therapy and WBAT to RLE.    Melissa Lambert 06/27/2014, 4:57 PM  [phone]

## 2014-06-27 NOTE — Plan of Care (Signed)
Problem: RH PAIN MANAGEMENT Goal: RH STG PAIN MANAGED AT OR BELOW PT'S PAIN GOAL <5  Outcome: Progressing Rates pain as 3

## 2014-06-27 NOTE — Progress Notes (Signed)
Occupational Therapy Session Note  Patient Details  Name: Melissa Lambert MRN: [id number] Date of Birth: 10/14/1928  Today's Date: 06/27/2014 OT Individual Time: 2004-1593 OT Individual Time Calculation (min): 60 min    Short Term Goals: Week 1:  OT Short Term Goal 1 (Week 1): Pt will perform tub transfer onto TTB with Max A of 1 in order to decrease assistance with functional transfer. OT Short Term Goal 1 - Progress (Week 1): Met OT Short Term Goal 2 (Week 1): Pt will perform toilet transfer with Max A of 1 in order to decrease assistance with functional transfer. OT Short Term Goal 2 - Progress (Week 1): Met OT Short Term Goal 3 (Week 1): Pt will perform 1/3 tasks of toileting in order to decrease assistance for functional tasks.  OT Short Term Goal 3 - Progress (Week 1): Met OT Short Term Goal 4 (Week 1): Pt will perform LB dressing with Max A of 1 in order to decrease assistance with self care. OT Short Term Goal 4 - Progress (Week 1): Met  Skilled Therapeutic Interventions/Progress Updates:    Pt seen for ADL retraining with focus on functional transfers, standing balance, use of AE, and activity tolerance. Pt received sitting in recliner chair. Ambulated to bathroom with min assist using RW and increased time for sit>stand. Pt completed bathing at shower level with min assist for standing balance and use of grab bars. Pt required cues for rest breaks when drying d/t SOB. Completed dressing with use of reacher and min cues for proper technique. Pt required min-SBA for standing balance to manage clothing around waist. Educated pt on importance of rest breaks and energy conservation techniques during self-care tasks. Pt's SpO2>90% throughout session. Pt left sitting in recliner chair with all needs in reach and sister present.  Therapy Documentation Precautions:  Precautions Precautions: Fall Precaution Comments: h/o falls Restrictions Weight Bearing Restrictions: Yes RLE Weight  Bearing: Weight bearing as tolerated General:   Vital Signs:  Pain: No report of pain during therapy sessions.  See FIM for current functional status  Therapy/Group: Individual Therapy  Duayne Cal 06/27/2014, 12:32 PM

## 2014-06-27 NOTE — Progress Notes (Signed)
[facility] PHYSICAL MEDICINE & REHABILITATION     PROGRESS NOTE    Subjective/Complaints: Notes some bruising on hip, pain during therapy but participating well  Review of Systems - Negative except RIght hip pain with PT activities  Objective: Vital Signs: Blood pressure 128/55, pulse 63, temperature 97.5 F (36.4 C), temperature source Oral, resp. rate 16, height 5\' 1"  (1.549 m), weight 92 kg (202 lb 13.2 oz), SpO2 94 %. No results found. No results for input(s): WBC, HGB, HCT, PLT in the last 72 hours. No results for input(s): NA, K, CL, GLUCOSE, BUN, CREATININE, CALCIUM in the last 72 hours.  Invalid input(s): CO CBG (last 3)  No results for input(s): GLUCAP in the last 72 hours.  Wt Readings from Last 3 Encounters:  06/16/14 92 kg (202 lb 13.2 oz)  06/12/14 89.812 kg (198 lb)    Physical Exam:  Constitutional: obese, no distress HENT: oral mucosa pink Head: Normocephalic.  Eyes: EOM are normal.  Neck: Normal range of motion. Neck supple. No thyromegaly present.  Cardiovascular: Normal rate and regular rhythm.  Respiratory: Effort normal and breath sounds normal. No respiratory distress.  GI: Soft. Bowel sounds are normal. She exhibits no distension.  Skin:  Right hip surgical site erythema no drainage, R lat and inf thigh CDI M/s: right shoulder ROM limited Neuro: alert and oriented. hoh 3 minus left deltoid, 4/5 right deltoid, 4/5 bilateral biceps triceps and grip 3- right hip flexion 3- knee extension for ankle dorsiflexion plantar flexion 4/5 in the left hip flexor and extensor ankle dorsi flexors and plantar fldexor Sensory intact to light touch bilateral lower extremity Psych: pleasant and appropriate  Assessment/Plan: 1. Functional deficits secondary to right subtrochanteric femur fracture which require 3+ hours per day of interdisciplinary therapy in a comprehensive inpatient rehab setting. Physiatrist is providing close team supervision and 24  hour management of active medical problems listed below. Physiatrist and rehab team continue to assess barriers to discharge/monitor patient progress toward functional and medical goals.   FIM: FIM - Bathing Bathing Steps Patient Completed: Chest, Right Arm, Left Arm, Abdomen, Front perineal area, Left upper leg, Right upper leg Bathing: 3: Mod-Patient completes 5-7 11f 10 parts or 50-74%  FIM - Upper Body Dressing/Undressing Upper body dressing/undressing steps patient completed: Thread/unthread right bra strap, Thread/unthread left bra strap, Hook/unhook bra, Thread/unthread right sleeve of pullover shirt/dresss, Thread/unthread left sleeve of pullover shirt/dress, Put head through opening of pull over shirt/dress, Pull shirt over trunk Upper body dressing/undressing: 4: Min-Patient completed 75 plus % of tasks FIM - Lower Body Dressing/Undressing Lower body dressing/undressing steps patient completed: Thread/unthread right underwear leg, Thread/unthread left underwear leg, Pull underwear up/down, Thread/unthread right pants leg, Thread/unthread left pants leg, Pull pants up/down Lower body dressing/undressing: 4: Min-Patient completed 75 plus % of tasks  FIM - Toileting Toileting steps completed by patient: Adjust clothing prior to toileting, Performs perineal hygiene Toileting Assistive Devices: Grab bar or rail for support Toileting: 3: Mod-Patient completed 2 of 3 steps  FIM - Radio producer Devices: Elevated toilet seat, Grab bars, Insurance account manager Transfers: 4-To toilet/BSC: Min A (steadying Pt. > 75%), 4-From toilet/BSC: Min A (steadying Pt. > 75%)  FIM - Bed/Chair Transfer Bed/Chair Transfer Assistive Devices: Walker, Arm rests Bed/Chair Transfer: 5: Chair or W/C > Bed: Supervision (verbal cues/safety issues)  FIM - Locomotion: Wheelchair Distance: 110 Locomotion: Wheelchair: 0: Activity did not occur FIM - Locomotion: Ambulation Locomotion:  Ambulation Assistive Devices: Administrator Ambulation/Gait Assistance: 5:  Supervision Locomotion: Ambulation: 2: Travels 50 - 149 ft with supervision/safety issues  Comprehension Comprehension Mode: Auditory Comprehension: 6-Follows complex conversation/direction: With extra time/assistive device  Expression Expression Mode: Verbal Expression: 5-Expresses complex 90% of the time/cues < 10% of the time  Social Interaction Social Interaction: 5-Interacts appropriately 90% of the time - Needs monitoring or encouragement for participation or interaction.  Problem Solving Problem Solving: 4-Solves basic 75 - 89% of the time/requires cueing 10 - 24% of the time  Memory Memory: 4-Recognizes or recalls 75 - 89% of the time/requires cueing 10 - 24% of the time  Medical Problem List and Plan: 1. Functional deficits secondary to right subtrochanteric femur fracture. Status post IM nailing 06/13/2014. Weightbearing as tolerated 2. 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   3. Pain Management: Hydrocodone as needed.encouraged pt to take 2 tabs  Monitor with increased mobility 4. Acute blood loss anemia. hgb have been stable 5. Neuropsych: This patient is capable of making decisions on her own behalf. 6. Skin/Wound Care: routine skin checks, no response to doxy, has PCN allergy, start Vanc and levaquin   , ask Ortho to eval 7. Fluids/Electrolytes/Nutrition: follow-up chemistries appropriate today. Strict I and O's 8.Hypertension. No present antihypertensive medication. Patient on Norvasc 2.5 mg twice a day prior to admission and Diovan 320-25 milligrams daily.Monitor with increased mobility and resume antihypertensive medications as needed 9.Overactive Bladder. Ditropan 5 mg daily. Checking pvr's 10.GERD. Protonix 11. Left shoulder contractures secondary to history of severe osteoarthritis and total shoulder arthroplasty LOS  (Days) 11 A FACE TO FACE EVALUATION WAS PERFORMED    Charlett Blake 06/27/2014 6:58 AM

## 2014-06-27 NOTE — Progress Notes (Signed)
Physical Therapy Session Note  Patient Details  Name: Melissa Lambert MRN: [id number] Date of Birth: 12-15-1928  Today's Date: 06/27/2014 PT Individual Time: 0830-0930 PT Individual Time Calculation (min): 60 min   Short Term Goals: Week 2:  PT Short Term Goal 1 (Week 2): =LTG's due to ELOS   Skilled Therapeutic Interventions/Progress Updates:   Pt received sitting in recliner in room, stating that sister had gone to get van.  Skilled session focused on assessment and attempting to perform car transfer to sister's van.  Once outside, seemed that van height is somewhat shorter than originally expected, however still feel she needs her sister's step to get into [place].  Note that step is approx 6" hight but feel that pt could possibly perform.  PT demonstrated how to perform and where to place hands with max verbal cues on sitting back as far as possible into seat to avoid forward slide out of van.  Pt unable to perform on first attempt, and states that she is very nervous.  Had pt sit and rest while PT assessed getting into/out of back seat.  Note that this seat inclines upward more than front seat and would be more difficult to get into/out of than front.  Pt attempted one more time and she was able to place LLE up to step and lift RLE, however was unable to fully bring to step due to weakness, fatigue and anxiety.  Assisted pt back to therapy gym (brought step with [place] to continue practicing) to perform slightly lower step (approx 4").  Pt able to perform with min A.  Then attempted 6" step once more and pt unable to perform.  Will continue to work towards, however feel that she is safest leaving in other sister's car at D/C.  PT voiced concern of having to leave house if other sister were not there.  Discussed not attempting at home until she can safely do either before she leaves CIR or can work on with [place].  Both sister and pt verbalized understanding.  Pt assisted back to room and left in recliner.   All needs in reach.   Therapy Documentation Precautions:  Precautions Precautions: Fall Precaution Comments: h/o falls Restrictions Weight Bearing Restrictions: Yes RLE Weight Bearing: Weight bearing as tolerated  Pain: Pt with c/o muscle fatigue during session.   See FIM for current functional status  Therapy/Group: Individual Therapy  Denice Bors 06/27/2014, 9:42 AM

## 2014-06-27 NOTE — Progress Notes (Addendum)
ANTIBIOTIC CONSULT NOTE - INITIAL  Pharmacy Consult for Vancomycin  Indication: Wound infection  Allergies  Allergen Reactions  . Adhesive [Tape] Itching  . Penicillins Itching, Swelling and Rash    Streptomycin also  . Tuberculin Tests Itching, Swelling and Rash    Patient Measurements: Height: 5\' 1"  (154.9 cm) Weight: 202 lb 13.2 oz (92 kg) IBW/kg (Calculated) : 47.8   Medical History: Past Medical History  Diagnosis Date  . Hypertension   . Asthma   . PE (pulmonary embolism)   . Protein C deficiency   . Protein S deficiency     Assessment: Vancomycin for wound infection, no labs since 11/6  Goal of Therapy:  Vancomycin trough level 15-20 mcg/ml  Plan:  -Vancomycin 1000 mg IV x 1 -Check BMET to ensure no change in renal function before ordering additional doses  Narda Bonds 06/27/2014,7:23 AM   Addendum: SCr 0.63 (CrCl ~31 to 51 depends on if you round SCr to 1 or not due to age).  Per RN, hasn't given 1g of vancomycin dose yet.  - change vancomycin to 1250mg  iv q24h - check vancomycin trough when it's indicated

## 2014-06-28 ENCOUNTER — Inpatient Hospital Stay (HOSPITAL_COMMUNITY): Payer: Medicare Other | Admitting: *Deleted

## 2014-06-28 ENCOUNTER — Inpatient Hospital Stay (HOSPITAL_COMMUNITY): Payer: Medicare Other | Admitting: Physical Therapy

## 2014-06-28 ENCOUNTER — Inpatient Hospital Stay (HOSPITAL_COMMUNITY): Payer: Medicare Other | Admitting: Occupational Therapy

## 2014-06-28 ENCOUNTER — Inpatient Hospital Stay (HOSPITAL_COMMUNITY): Payer: Medicare Other

## 2014-06-28 LAB — PROTIME-INR
INR: 2.51 — AB (ref 0.00–1.49)
PROTHROMBIN TIME: 27.3 s — AB (ref 11.6–15.2)

## 2014-06-28 MED ORDER — WARFARIN SODIUM 5 MG PO TABS
5.0000 mg | ORAL_TABLET | Freq: Once | ORAL | Status: AC
  Administered 2014-06-28: 5 mg via ORAL
  Filled 2014-06-28 (×2): qty 1

## 2014-06-28 NOTE — Progress Notes (Signed)
Occupational Therapy Session Note  Patient Details  Name: Melissa Lambert MRN: [id number] Date of Birth: 08-05-1929  Today's Date: 06/28/2014 OT Individual Time: 8182-9937 OT Individual Time Calculation (min): 60 min    Short Term Goals: Week 1:  OT Short Term Goal 1 (Week 1): Pt will perform tub transfer onto TTB with Max A of 1 in order to decrease assistance with functional transfer. OT Short Term Goal 1 - Progress (Week 1): Met OT Short Term Goal 2 (Week 1): Pt will perform toilet transfer with Max A of 1 in order to decrease assistance with functional transfer. OT Short Term Goal 2 - Progress (Week 1): Met OT Short Term Goal 3 (Week 1): Pt will perform 1/3 tasks of toileting in order to decrease assistance for functional tasks.  OT Short Term Goal 3 - Progress (Week 1): Met OT Short Term Goal 4 (Week 1): Pt will perform LB dressing with Max A of 1 in order to decrease assistance with self care. OT Short Term Goal 4 - Progress (Week 1): Met  Skilled Therapeutic Interventions/Progress Updates:    Pt seen for ADL retraining with focus on functional mobility, standing balance and tolerance, and activity tolerance. Pt received sitting in recliner chair. Ambulated to bathroom with CGA-SBA using RW and increased time. Pt required CGA for standing balance during clothing management and hygiene. Pt completed "spot bathing" at sink, washing few body parts. Pt stood for grooming tasks, then required long rest break d/t fatigue. Therapist assisted with donning pants and undergarments d/t decreased time as pt required multiple rest breaks during session. Pt stood with SBA to manage pants around waist. At end of session pt left sitting in w/c with all needs in reach.   Therapy Documentation Precautions:  Precautions Precautions: Fall Precaution Comments: h/o falls Restrictions Weight Bearing Restrictions: Yes RLE Weight Bearing: Weight bearing as tolerated General:   Vital Signs:   Pain: Pt  reporting 4/10 pain in RLE. RN provided pain medication.  See FIM for current functional status  Therapy/Group: Individual Therapy  Duayne Cal 06/28/2014, 10:52 AM

## 2014-06-28 NOTE — Plan of Care (Signed)
Problem: RH BLADDER ELIMINATION Goal: RH STG MANAGE BLADDER WITH ASSISTANCE STG Manage Bladder With Assistance. Mod I  Outcome: Progressing  Problem: RH SKIN INTEGRITY Goal: RH STG MAINTAIN SKIN INTEGRITY WITH ASSISTANCE STG Maintain Skin Integrity With Assistance. Min A  Outcome: Not Progressing  Problem: RH PAIN MANAGEMENT Goal: RH STG PAIN MANAGED AT OR BELOW PT'S PAIN GOAL <5  Outcome: Progressing

## 2014-06-28 NOTE — Progress Notes (Signed)
[place] PHYSICAL MEDICINE & REHABILITATION     PROGRESS NOTE    Subjective/Complaints: Appreciate Ortho note  Review of Systems - Negative except RIght hip pain with PT activities  Objective: Vital Signs: Blood pressure 130/72, pulse 66, temperature 98 F (36.7 C), temperature source Oral, resp. rate 18, height 5\' 1"  (1.549 m), weight 92 kg (202 lb 13.2 oz), SpO2 94 %. No results found.  Recent Labs  06/27/14 0808  WBC 5.6  HGB 10.9*  HCT 33.4*  PLT 442*    Recent Labs  06/27/14 0808  NA 130*  K 4.1  CL 94*  GLUCOSE 112*  BUN 14  CREATININE 0.63  CALCIUM 9.3   CBG (last 3)  No results for input(s): GLUCAP in the last 72 hours.  Wt Readings from Last 3 Encounters:  06/16/14 92 kg (202 lb 13.2 oz)  06/12/14 89.812 kg (198 lb)    Physical Exam:  Constitutional: obese, no distress HENT: oral mucosa pink Head: Normocephalic.  Eyes: EOM are normal.  Neck: Normal range of motion. Neck supple. No thyromegaly present.  Cardiovascular: Normal rate and regular rhythm.  Respiratory: Effort normal and breath sounds normal. No respiratory distress.  GI: Soft. Bowel sounds are normal. She exhibits no distension.  Skin:  One remaining staple no erythema or drainage around it M/s: right shoulder ROM limited Neuro: alert and oriented. hoh 3 minus left deltoid, 4/5 right deltoid, 4/5 bilateral biceps triceps and grip 3- right hip flexion 3- knee extension for ankle dorsiflexion plantar flexion 4/5 in the left hip flexor and extensor ankle dorsi flexors and plantar fldexor  Psych: pleasant and appropriate  Assessment/Plan: 1. Functional deficits secondary to right subtrochanteric femur fracture which require 3+ hours per day of interdisciplinary therapy in a comprehensive inpatient rehab setting. Physiatrist is providing close team supervision and 24 hour management of active medical problems listed below. Physiatrist and rehab team continue to assess  barriers to discharge/monitor patient progress toward functional and medical goals. May remove remaining staple  FIM: FIM - Bathing Bathing Steps Patient Completed: Chest, Right Arm, Left Arm, Abdomen, Front perineal area, Left upper leg, Right upper leg Bathing: 3: Mod-Patient completes 5-7 59f 10 parts or 50-74%  FIM - Upper Body Dressing/Undressing Upper body dressing/undressing steps patient completed: Thread/unthread right bra strap, Thread/unthread left bra strap, Hook/unhook bra, Thread/unthread right sleeve of front closure shirt/dress, Thread/unthread left sleeve of front closure shirt/dress, Button/unbutton shirt Upper body dressing/undressing: 4: Min-Patient completed 75 plus % of tasks FIM - Lower Body Dressing/Undressing Lower body dressing/undressing steps patient completed: Thread/unthread right underwear leg, Pull underwear up/down, Thread/unthread right pants leg, Thread/unthread left pants leg, Pull pants up/down Lower body dressing/undressing: 3: Mod-Patient completed 50-74% of tasks  FIM - Toileting Toileting steps completed by patient: Adjust clothing prior to toileting, Performs perineal hygiene Toileting Assistive Devices: Grab bar or rail for support Toileting: 3: Mod-Patient completed 2 of 3 steps  FIM - Radio producer Devices: Elevated toilet seat, Grab bars, Insurance account manager Transfers: 4-To toilet/BSC: Min A (steadying Pt. > 75%), 4-From toilet/BSC: Min A (steadying Pt. > 75%)  FIM - Bed/Chair Transfer Bed/Chair Transfer Assistive Devices: Walker, Arm rests Bed/Chair Transfer: 5: Bed > Chair or W/C: Supervision (verbal cues/safety issues), 5: Chair or W/C > Bed: Supervision (verbal cues/safety issues)  FIM - Locomotion: Wheelchair Distance: 110 Locomotion: Wheelchair: 1: Total Assistance/staff pushes wheelchair (Pt<25%) FIM - Locomotion: Ambulation Locomotion: Ambulation Assistive Devices: Administrator Ambulation/Gait  Assistance: 5: Supervision Locomotion: Ambulation:  2: Travels 50 - 149 ft with supervision/safety issues  Comprehension Comprehension Mode: Auditory Comprehension: 6-Follows complex conversation/direction: With extra time/assistive device  Expression Expression Mode: Verbal Expression: 5-Expresses complex 90% of the time/cues < 10% of the time  Social Interaction Social Interaction: 5-Interacts appropriately 90% of the time - Needs monitoring or encouragement for participation or interaction.  Problem Solving Problem Solving: 4-Solves basic 75 - 89% of the time/requires cueing 10 - 24% of the time  Memory Memory: 4-Recognizes or recalls 75 - 89% of the time/requires cueing 10 - 24% of the time  Medical Problem List and Plan: 1. Functional deficits secondary to right subtrochanteric femur fracture. Status post IM nailing 06/13/2014. Weightbearing as tolerated 2. 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   3. Pain Management: Hydrocodone as needed.encouraged pt to take 2 tabs  Monitor with increased mobility 4. Acute blood loss anemia. hgb have been stable 5. Neuropsych: This patient is capable of making decisions on her own behalf. 6. Skin/Wound Care: routine skin checks, no response to doxy, has PCN allergy, start Vanc and levaquin   , ask Ortho to re eval prior to D/C should be able to switch IV to po 7. Fluids/Electrolytes/Nutrition: follow-up chemistries appropriate today. Strict I and O's 8.Hypertension. No present antihypertensive medication. Patient on Norvasc 2.5 mg twice a day prior to admission and Diovan 320-25 milligrams daily.Monitor with increased mobility and resume antihypertensive medications as needed 9.Overactive Bladder. Ditropan 5 mg daily. Checking pvr's 10.GERD. Protonix 11. Left shoulder contractures secondary to history of severe osteoarthritis and total shoulder arthroplasty LOS (Days) 12 A  FACE TO FACE EVALUATION WAS PERFORMED    Alysia Penna E 06/28/2014 7:32 AM

## 2014-06-28 NOTE — Progress Notes (Signed)
Physical Therapy Session Note  Patient Details  Name: Melissa Lambert MRN: [id number] Date of Birth: November 03, 1928  Today's Date: 06/28/2014 PT Individual Time: 1300-1400 PT Individual Time Calculation (min): 60 min   Short Term Goals:  Week 2:  PT Short Term Goal 1 (Week 2): =LTG's due to ELOS   Skilled Therapeutic Interventions/Progress Updates:    Tx focused on functional mobility training, gait with RW, and therex for strengthening.  Pt up in recliner, agreeable to PT. Performed scooting in recliner and WC with MIn A for hip advancement to safe/functional position.   Performed multiple stand-step and sit<>stand transfers with RW and Min A for steadying due to increased pain this afternoon. Pt needed cues for safe hand placement and increased time.   Gait training x100' with RW and close S. Pt needed cues for posture and forward gaze, which she was able to modify, but not maintain. Slow gate speed and seated rest required after.  Pt performed 36min on Nustep for strength and ROM at level 3 with bil UE and LEs.   Instructed pt in ramp entry with close S and RW, but pt declined reattempt for 6" step for car access. Pt did attempt curb step. She was able to step down 4" step with min-guard, but not back up due to LE weakness.   Pt left up in recliner with sister and all needs in reach.   Therapy Documentation Precautions:  Precautions Precautions: Fall Precaution Comments: h/o falls Restrictions Weight Bearing Restrictions: Yes RLE Weight Bearing: Weight bearing as tolerated    Pain: 5/10 RLE pain, modified tx prn    Locomotion : Ambulation Ambulation/Gait Assistance: 5: Supervision Wheelchair Mobility Distance: 50   See FIM for current functional status  Therapy/Group: Individual Therapy  Kennieth Rad, PT, DPT 06/28/2014, 4:14 PM

## 2014-06-28 NOTE — Plan of Care (Signed)
Problem: RH BLADDER ELIMINATION Goal: RH STG MANAGE BLADDER WITH ASSISTANCE STG Manage Bladder With Assistance. Mod I  Outcome: Progressing     

## 2014-06-28 NOTE — Progress Notes (Signed)
Physical Therapy Session Note  Patient Details  Name: Melissa Lambert MRN: [id number] Date of Birth: 07/20/1929  Today's Date: 06/28/2014 PT Individual Time: 1100-1205 PT Individual Time Calculation (min): 65 min   Short Term Goals: Week 2:  PT Short Term Goal 1 (Week 2): =LTG's due to ELOS   Skilled Therapeutic Interventions/Progress Updates:   Pt received sleeping in recliner, easily aroused to voice. Pt reports having difficult time getting up and moving this AM but agreeable to therapy. Pt performed sit <> stand using RW from w/c and recliner with increased time and supervision. Patient ambulated from room to therapy gym x 160 ft using RW with supervision and greatly increased time. Standing therex using RW: heel raises, toe raises, R/L hip flexion, R hip abduction, R hip extension, x 10-20 each. Pt propelled w/c x 25 ft with BUE and min A. Pt requesting to use bathroom and ambulated in room to/from elevated commode and performed clothing management and hygiene at supervision level. Pt left sitting in recliner with LEs elevated and all needs within reach, sister present.   Therapy Documentation Precautions:  Precautions Precautions: Fall Precaution Comments: h/o falls Restrictions Weight Bearing Restrictions: Yes RLE Weight Bearing: Weight bearing as tolerated Pain: Pain Assessment Pain Assessment: 0-10 Pain Score: 5  Pain Type: Surgical pain Pain Location: Knee Pain Orientation: Right Pain Descriptors / Indicators: Other (Comment) ("like a band") Pain Onset: On-going Pain Intervention(s): Repositioned;Rest  See FIM for current functional status  Therapy/Group: Individual Therapy  Laretta Alstrom 06/28/2014, 12:13 PM

## 2014-06-28 NOTE — Progress Notes (Signed)
ANTICOAGULATION CONSULT NOTE - Follow Up Consult  Pharmacy Consult for coumadin Indication: hx of PE and protein C & S deficiencies   Allergies  Allergen Reactions  . Adhesive [Tape] Itching  . Penicillins Itching, Swelling and Rash    Streptomycin also  . Tuberculin Tests Itching, Swelling and Rash    Patient Measurements: Height: 5\' 1"  (154.9 cm) Weight: 202 lb 13.2 oz (92 kg) IBW/kg (Calculated) : 47.8 Heparin Dosing Weight:   Vital Signs: Temp: 98 F (36.7 C) (11/17 0527) Temp Source: Oral (11/17 0527) BP: 130/72 mmHg (11/17 0527) Pulse Rate: 66 (11/17 0527)  Labs:  Recent Labs  06/26/14 0622 06/27/14 0409 06/27/14 0808 06/28/14 0620  HGB  --   --  10.9*  --   HCT  --   --  33.4*  --   PLT  --   --  442*  --   LABPROT 32.1* 30.6*  --  27.3*  INR 3.09* 2.91*  --  2.51*  CREATININE  --   --  0.63  --     Estimated Creatinine Clearance: 55.1 mL/min (by C-G formula based on Cr of 0.63).   Medications:  Scheduled:  . calcium-vitamin D  1 tablet Oral BID  . docusate sodium  100 mg Oral BID  . levofloxacin (LEVAQUIN) IV  500 mg Intravenous Q24H  . loratadine  10 mg Oral Daily  . oxybutynin  5 mg Oral Daily  . pantoprazole  40 mg Oral Daily  . senna  2 tablet Oral BID  . triamcinolone   Topical BID  . vancomycin  1,250 mg Intravenous Q24H  . Warfarin - Pharmacist Dosing Inpatient   Does not apply q1800   Infusions:    Assessment: 80 yo female with hx of PE and protein C & S deficiencies is currently on therapeutic coumadin.  INR today is down to 2.51. Patient is on levaquin which may affect INR.  Goal of Therapy:  INR 2-3 Monitor platelets by anticoagulation protocol: Yes   Plan:  - coumadin 5mg  po x1 - INR in am  Jermiyah Ricotta, Tsz-Yin 06/28/2014,8:14 AM

## 2014-06-29 ENCOUNTER — Inpatient Hospital Stay (HOSPITAL_COMMUNITY): Payer: Medicare Other | Admitting: Occupational Therapy

## 2014-06-29 ENCOUNTER — Inpatient Hospital Stay (HOSPITAL_COMMUNITY): Payer: Medicare Other | Admitting: Rehabilitation

## 2014-06-29 DIAGNOSIS — Z86711 Personal history of pulmonary embolism: Secondary | ICD-10-CM

## 2014-06-29 LAB — PROTIME-INR
INR: 2.31 — ABNORMAL HIGH (ref 0.00–1.49)
Prothrombin Time: 25.6 seconds — ABNORMAL HIGH (ref 11.6–15.2)

## 2014-06-29 MED ORDER — WARFARIN SODIUM 5 MG PO TABS
5.0000 mg | ORAL_TABLET | Freq: Once | ORAL | Status: AC
  Administered 2014-06-29: 5 mg via ORAL
  Filled 2014-06-29: qty 1

## 2014-06-29 NOTE — Discharge Summary (Signed)
NAMEWHITNEY, HILLEGASS NO.:  [id number]  MEDICAL RECORD NO.:  [id number]  LOCATION:  4M06C                        FACILITY:  [facility]  PHYSICIAN:  Charlett Blake, M.D.DATE OF BIRTH:  07-26-1929  DATE OF ADMISSION:  06/16/2014 DATE OF DISCHARGE:  06/30/2014                              DISCHARGE SUMMARY   DISCHARGE DIAGNOSES: 1. Functional deficits secondary to right subtrochanteric femur     fracture with intramedullary nailing, June 13, 2014. 2. Chronic Coumadin therapy for protein C, protein S deficiency, as     well as pulmonary emboli. 3. Pain management. 4. Acute blood loss anemia. 5. Hypertension. 6. Overactive bladder. 7. Gastroesophageal reflux disease. 8. Left shoulder contractures secondary to severe osteoarthritis.  HISTORY OF PRESENT ILLNESS:  This is an 80 year old right-handed female, history of protein C and protein S deficiency with pulmonary emboli, maintained on chronic Coumadin.  The patient lives alone, used a cane and walker prior to admission.  Her sister lives next door and provides assistance as needed.  Admitted on June 12, 2014, after mechanical fall at home, landing on her right side without loss of consciousness. Cranial CT scan negative for acute changes.  X-rays and imaging revealed a right subtrochanteric femur fracture.  Underwent open treatment of right subtrochanteric femur fracture with intramedullary nailing on June 13, 2014, per Dr. Doran Durand.  Weightbearing as tolerated.  Hospital course, pain management.  Chronic Coumadin resumed.  Acute blood loss anemia, 9.4, monitored.  Physical and occupational therapy ongoing.  The patient was admitted for comprehensive rehab program.  PAST MEDICAL HISTORY:  See discharge diagnoses.  SOCIAL HISTORY:  Lives alone, independent with a cane walker prior to admission.  She does have a lift chair functional mobility, prior to admission was +2 physical assist, supine to sit;  total assist, sit to supine; min to mod assist, activities of daily living.  PHYSICAL EXAMINATION:  VITAL SIGNS:  Blood pressure 95/37, pulse 69, temperature 98, respirations 18. GENERAL:  This was an alert female, oriented x3.  She was a bit hard of hearing.  Followed full commands. LUNGS:  Clear to auscultation. CARDIAC:  Regular rate and rhythm. ABDOMEN:  Soft, nontender.  Good bowel sounds. PELVIC:  Right hip surgical site with some dry drainage.  No odor. Appropriately tender.  REHABILITATION HOSPITAL COURSE:  Patient was admitted to Inpatient Rehab Services with therapies initiated on a 3-hour daily basis consisting of physical therapy, occupational therapy, and rehabilitation nursing.  The following issues were addressed during patient's rehabilitation stay. Pertaining to Mrs. Muecke's right subtrochanteric femur fracture, she had undergone IM nailing on June 13, 2014, per Dr. Doran Durand, weightbearing as tolerated.  Noted some drainage, purulence from superior incision site.  Staples remaining intact.  She was placed on intravenous Levaquin as well as vancomycin with followup per Orthopedic Services and monitored dressing changes as advised.  She remained afebrile.  She would follow up with Orthopedics Services.  She remained on chronic Coumadin therapy for history of pulmonary emboli, protein C, protein S deficiency, latest INR of 2.3.  She was to follow up with Dr. Manuella Ghazi, [phone] for ongoing Coumadin therapy monitoring  and home health nurse had been arranged.  Pain management  with the use of hydrocodone and good results.  Blood pressures remained well controlled and monitored on no current antihypertensive medications.  She did have a history of overactive bladder.  She remained on Ditropan.  The patient received weekly collaborative interdisciplinary team conferences to discuss estimated length of stay, family teaching, and any barriers to discharge.  She was ambulating  100 feet rolling walker, close supervision, strength and endurance continued to improve.  She was able to step down a 4-inch curb with minimal guard, activities of daily living.  Patient can ambulate to the bathroom with minimal assistance using a rolling walker.  Some increased time for sit to stand, completed bathing and shower level with minimal assist for standing balance. Completed dressing with use of a reacher and minimal cues.  Full family teaching was completed with her sister and plan was to be discharged to home with home health physical and occupational therapy.  DISCHARGE MEDICATIONS:  Included Os-Cal 1 tablet b.i.d., hydrocodone 1-2 tablets every 6 hours as needed for pain, dispense of 90 tablets, Colace 100 mg p.o. b.i.d., Levaquin 500 mg daily completed 10 day course, Claritin 10 mg p.o. daily, Ditropan 5 mg p.o. daily, Protonix 40 mg p.o. daily, Coumadin latest dose of 5 mg daily adjusted accordingly for INR of 2.0 to 3.0.  DIET:  Her diet was regular.  SPECIAL INSTRUCTIONS:  Keep wound clean and dry.  Call if any increased redness, drainage, or fever.  Home health nurse arranged to check INR on Monday, July 04, 2014, results to Dr. Monico Blitz, [address] [place], [place], [place] fax number [phone].     Lauraine Rinne, P.A.   ______________________________ Charlett Blake, M.D.    DA/MEDQ  D:  06/29/2014  T:  06/29/2014  Job:  [id number]  cc:   Monico Blitz, MD Wylene Simmer, MD

## 2014-06-29 NOTE — Progress Notes (Signed)
Social Work Patient ID: Melissa Lambert, female   DOB: 09/05/1928, 80 y.o.   MRN: [id number] Met with pt and sister to discussed team conference progression toward her goals and being prepared for discharge tomorrow.  Both feel she is ready and gl;ad to be going home. Both sister's have gone through training and feel comfortable with her care.  Sister to get the tub bench she needs at the local medical store., offered to get it for her and she  Declined.  [facility] to provide follow up pt's prefers had them before.  See tomorrow if more questions.

## 2014-06-29 NOTE — Progress Notes (Signed)
ANTICOAGULATION CONSULT NOTE - Follow Up Consult  Pharmacy Consult for coumadin Indication: hx of PE and protein C and S deficiencies  Allergies  Allergen Reactions  . Adhesive [Tape] Itching  . Penicillins Itching, Swelling and Rash    Streptomycin also  . Tuberculin Tests Itching, Swelling and Rash    Patient Measurements: Height: 5\' 1"  (154.9 cm) Weight: 203 lb 7.8 oz (92.3 kg) IBW/kg (Calculated) : 47.8 Heparin Dosing Weight:   Vital Signs: Temp: 97.9 F (36.6 C) (11/18 0535) Temp Source: Oral (11/18 0535) BP: 129/58 mmHg (11/18 0535) Pulse Rate: 57 (11/18 0535)  Labs:  Recent Labs  06/27/14 0409 06/27/14 0808 06/28/14 0620 06/29/14 0418  HGB  --  10.9*  --   --   HCT  --  33.4*  --   --   PLT  --  442*  --   --   LABPROT 30.6*  --  27.3* 25.6*  INR 2.91*  --  2.51* 2.31*  CREATININE  --  0.63  --   --     Estimated Creatinine Clearance: 55.2 mL/min (by C-G formula based on Cr of 0.63).   Medications:  Scheduled:  . calcium-vitamin D  1 tablet Oral BID  . docusate sodium  100 mg Oral BID  . levofloxacin (LEVAQUIN) IV  500 mg Intravenous Q24H  . loratadine  10 mg Oral Daily  . oxybutynin  5 mg Oral Daily  . pantoprazole  40 mg Oral Daily  . senna  2 tablet Oral BID  . triamcinolone   Topical BID  . vancomycin  1,250 mg Intravenous Q24H  . Warfarin - Pharmacist Dosing Inpatient   Does not apply q1800   Infusions:    Assessment: 80 yo female with hx of PE and protein C and S deficiencies is currently on therapeutic coumadin.  INR is down a little to 2.31. Goal of Therapy:  INR 2-3 Monitor platelets by anticoagulation protocol: Yes   Plan:  - coumadin 5 mg po x1 - INR in am  Offie Waide, Tsz-Yin 06/29/2014,8:10 AM

## 2014-06-29 NOTE — Plan of Care (Signed)
Problem: RH SKIN INTEGRITY Goal: RH STG MAINTAIN SKIN INTEGRITY WITH ASSISTANCE STG Maintain Skin Integrity With Assistance. Min A  Outcome: Progressing

## 2014-06-29 NOTE — Progress Notes (Signed)
Physical Therapy Discharge Summary  Patient Details  Name: Melissa Lambert MRN: [id number] Date of Birth: 16-Dec-1928  Today's Date: 06/29/2014 PT Individual Time: 0830-0930 PT Individual Time Calculation (min): 60 min    Patient has met 8 of 8 long term goals due to improved activity tolerance, improved balance, improved postural control, increased strength, decreased pain and ability to compensate for deficits.  Patient to discharge at an ambulatory level Supervision.   Patient's care partner is independent to provide the necessary cognitive and physical assistance at discharge.  Reasons goals not met: Note that bed mobility goal was D/C'd, however feel she could work on this with home health.   Recommendation:  Patient will benefit from ongoing skilled PT services in home health setting to continue to advance safe functional mobility, address ongoing impairments in decreased balance, decreased strength, decreased endurance, decreased ROM in RLE, and minimize fall risk.  Equipment: No equipment provided  Reasons for discharge: treatment goals met and discharge from hospital   PT Treatment/Intervention:  Pt received sitting in recliner, agreeable to therapy session.  Assisted with donning pants and shirt/bra prior to session for modesty.  Performed sit<>stand and dynamic standing balance and S level in order to assist with pulling up pants and snapping bra.  Pt ambulated up to 22' total throughout session in controlled and home environment with RW at S level.  Continued cues for upright posture and increased stride length.  Performed car transfer at min A level to elevate and lower RLE in/out of car.  Performed furniture transfer in ADL apt at S level with min cues for hand placement and safety.  Then assisted to therapy gym to perform simulated bed mobility at min A to elevate LE into bed and pt able to use leg lifter to get RLE out of bed on her own.  Performed 3, 4" steps with use of B  handrails at min/mod A level with cues for sequencing and technique.  Ended session with w/c mobility x 50' in controlled and ADL apt at S level with min cues for technique and to make turns safely.  Pt assisted remainder of distance back to room and sister assisted pt into restroom.    Patient/family agrees with progress made and goals achieved: Yes  PT Discharge Precautions/Restrictions Precautions Precautions: Fall Precaution Comments: h/o falls Restrictions Weight Bearing Restrictions: Yes RLE Weight Bearing: Weight bearing as tolerated  Pain Pain Assessment Pain Assessment: No/denies pain    Cognition Overall Cognitive Status: Within Functional Limits for tasks assessed Arousal/Alertness: Awake/alert Orientation Level: Oriented X4 Attention: Alternating Selective Attention: Appears intact Alternating Attention: Appears intact Memory: Impaired Memory Impairment: Decreased recall of new information Awareness: Appears intact Problem Solving: Appears intact Safety/Judgment: Appears intact Comments: Pt better able to recall hand placement and safety with standing and transfers.  Sensation Sensation Light Touch: Appears Intact Stereognosis: Not tested Hot/Cold: Not tested Proprioception: Appears Intact Coordination Gross Motor Movements are Fluid and Coordinated: Yes Fine Motor Movements are Fluid and Coordinated: No Coordination and Movement Description: limited secondary to pain and strength Motor  Motor Motor: Other (comment) Motor - Discharge Observations: Pt continues to be limited in RLE due to pain, causing decreased mobility and balance.   Mobility Bed Mobility Bed Mobility: Supine to Sit;Sit to Supine Supine to Sit: 4: Min guard Supine to Sit Details: Verbal cues for precautions/safety;Verbal cues for safe use of DME/AE Sit to Supine: 4: Min assist Sit to Supine - Details: Verbal cues for sequencing;Verbal cues for technique;Verbal cues  for  precautions/safety;Manual facilitation for placement Transfers Transfers: Yes Sit to Stand: 5: Supervision Sit to Stand Details: Verbal cues for sequencing;Verbal cues for technique;Verbal cues for precautions/safety Stand to Sit: 5: Supervision Stand to Sit Details (indicate cue type and reason): Verbal cues for sequencing;Verbal cues for technique;Verbal cues for precautions/safety Stand Pivot Transfers: 5: Supervision Stand Pivot Transfer Details: Verbal cues for sequencing;Verbal cues for technique;Verbal cues for precautions/safety Locomotion  Ambulation Ambulation: Yes Ambulation/Gait Assistance: 5: Supervision Ambulation Distance (Feet): 100 Feet Assistive device: Rolling walker Ambulation/Gait Assistance Details: Verbal cues for sequencing;Verbal cues for technique;Verbal cues for precautions/safety;Verbal cues for gait pattern;Verbal cues for safe use of DME/AE Gait Gait: Yes Gait Pattern: Impaired Gait Pattern: Decreased stance time - right;Decreased step length - left;Decreased stride length;Shuffle;Trendelenburg;Antalgic;Wide base of support;Decreased weight shift to right;Trunk flexed Stairs / Additional Locomotion Stairs: Yes Stairs Assistance: 3: Mod assist Stairs Assistance Details: Verbal cues for sequencing;Verbal cues for technique;Verbal cues for precautions/safety;Manual facilitation for weight shifting;Verbal cues for gait pattern Stair Management Technique: Two rails;Step to pattern;Forwards Number of Stairs: 3 Height of Stairs: 4 Architect: Yes Wheelchair Assistance: 5: Investment banker, operational Details: Verbal cues for sequencing;Verbal cues for Marketing executive: Both upper extremities Wheelchair Parts Management: Needs assistance Distance: 50  Trunk/Postural Assessment  Cervical Assessment Cervical Assessment: Exceptions to [facility] Cervical Strength Overall Cervical Strength Comments: Pt with very flexed  cervical spine Thoracic Assessment Thoracic Assessment: Exceptions to [facility] Thoracic Strength Overall Thoracic Strength Comments: kyphotic posture Lumbar Assessment Lumbar Assessment: Exceptions to [facility] Lumbar Strength Overall Lumbar Strength Comments: posterior pelvic tilt Postural Control Postural Control: Deficits on evaluation Postural Limitations: decreased ability to maintain upright posture, decreased righting reactions in standing.   Balance Balance Balance Assessed: Yes Static Sitting Balance Static Sitting - Balance Support: Feet supported;Bilateral upper extremity supported Static Sitting - Level of Assistance: 6: Modified independent (Device/Increase time) Dynamic Sitting Balance Dynamic Sitting - Balance Support: Feet supported;Left upper extremity supported Dynamic Sitting - Level of Assistance: 6: Modified independent (Device/Increase time) Static Standing Balance Static Standing - Balance Support: During functional activity;Bilateral upper extremity supported Static Standing - Level of Assistance: 5: Stand by assistance Dynamic Standing Balance Dynamic Standing - Balance Support: During functional activity;Bilateral upper extremity supported Dynamic Standing - Level of Assistance: 5: Stand by assistance Extremity Assessment      RLE Assessment RLE Assessment: Exceptions to [facility] RLE Strength RLE Overall Strength: Deficits;Due to pain RLE Overall Strength Comments: hip flex 2+/5, knee flex 3/5, knee ext 3/5, ankle DF/PF 4/5 LLE Assessment LLE Assessment: Within Functional Limits (grossly [facility])  See FIM for current functional status  Denice Bors 06/29/2014, 11:42 AM

## 2014-06-29 NOTE — Patient Care Conference (Signed)
Inpatient RehabilitationTeam Conference and Plan of Care Update Date: 06/29/2014   Time: 11;45 AM    Patient Name: Melissa Lambert      Medical Record Number: [id number]  Date of Birth: [birth year] Sex: Female         Room/Bed: 4M06C/4M06C-01 Payor Info: Payor: MEDICARE / Plan: MEDICARE PART A AND B / Product Type: *No Product type* /    Admitting Diagnosis: subtrachanteric femur fx  Admit Date/Time:  06/16/2014  4:33 PM Admission Comments: No comment available   Primary Diagnosis:  Femur fracture, right Principal Problem: Femur fracture, right  Patient Active Problem List   Diagnosis Date Noted  . Hip fracture 06/16/2014  . Essential hypertension   . Shoulder joint contracture   . Protein C deficiency   . Protein S deficiency   . Femur fracture, right 06/12/2014  . History of pulmonary embolus (PE) 06/12/2014    Expected Discharge Date: Expected Discharge Date: 06/30/14  Team Members Present: Physician leading conference: Dr. Alysia Penna Social Worker Present: Ovidio Kin, LCSW Nurse Present: Heather Roberts, RN PT Present: Carney Living, PT;Emily Rinaldo Cloud, PT OT Present: Clyda Greener, OT SLP Present: Gunnar Fusi, SLP PPS Coordinator present : Daiva Nakayama, RN, CRRN     Current Status/Progress Goal Weekly Team Focus  Medical   Right hip wound infection  D/C  IV abx   Bowel/Bladder   Continent of bowel and bladder. LBM 06/27/14  Pt to remain continent of bowel and bladder  Monitor   Swallow/Nutrition/ Hydration     na        ADL's   Min A - SBA for ADL tasks at sink side and shower, CGA for shower transfer, CGA - SBA for dynamic standing balance for LB dressing, use of AE for LB dressing   supervision - Min A  endurance, dynamic standing balance, self care with AE traiining, pt and family education, d/c planning   Mobility   Supervision to min for bed mobility (does not have goal), S for remainder of mobility and min A for car transfers.   S overall, min A car  transfer  D/C planning, pt/family education   Communication     na        Safety/Cognition/ Behavioral Observations    no unsafe behaviors        Pain   Hip discomfort managed with Vicodin x 2 tabs  <4  Continue to offer pain medication 1hour prior to initial therapy   Skin   R hip incision x 3, Redness continue to be along incisional line. MASD healed. Bruising to R inner thigh  No additional skin breakdown  Encourage turn q 2hrs      *See Care Plan and progress notes for long and short-term goals.  Barriers to Discharge: elderly caregiver    Possible Resolutions to Barriers:  D/C in am    Discharge Planning/Teaching Needs:  HOme sister to stay with ehr until not needed.  Sister is here daily to attend therapies with pt.      Team Discussion:  Reaching her goals-supervision/min level.  Both sister's trained in her care.  Finishing IV antibiotics today and will be on po tomorrow. Medically ready for dc tomorrow.  Revisions to Treatment Plan:  Ready for dc tomorrow.   Continued Need for Acute Rehabilitation Level of Care: The patient requires daily medical management by a physician with specialized training in physical medicine and rehabilitation for the following conditions: Daily direction of a multidisciplinary physical rehabilitation program to  ensure safe treatment while eliciting the highest outcome that is of practical value to the patient.: Yes Daily medical management of patient stability for increased activity during participation in an intensive rehabilitation regime.: Yes Daily analysis of laboratory values and/or radiology reports with any subsequent need for medication adjustment of medical intervention for : Post surgical problems  Melissa Lambert, Gardiner Rhyme 06/29/2014, 2:00 PM

## 2014-06-29 NOTE — Discharge Summary (Signed)
Discharge summary job [id number]

## 2014-06-29 NOTE — Plan of Care (Signed)
Problem: RH Balance Goal: LTG Patient will maintain dynamic standing with ADLs (OT) LTG: Patient will maintain dynamic standing balance with assist during activities of daily living (OT)  Outcome: Completed/Met Date Met:  06/29/14  Problem: RH Grooming Goal: LTG Patient will perform grooming w/assist,cues/equip (OT) LTG: Patient will perform grooming with assist, with/without cues using equipment (OT)  Outcome: Completed/Met Date Met:  06/29/14  Problem: RH Bathing Goal: LTG Patient will bathe with assist, cues/equipment (OT) LTG: Patient will bathe specified number of body parts with assist with/without cues using equipment (position) (OT)  Outcome: Completed/Met Date Met:  06/29/14  Problem: RH Dressing Goal: LTG Patient will perform upper body dressing (OT) LTG Patient will perform upper body dressing with assist, with/without cues (OT).  Outcome: Completed/Met Date Met:  06/29/14 Goal: LTG Patient will perform lower body dressing w/assist (OT) LTG: Patient will perform lower body dressing with assist, with/without cues in positioning using equipment (OT)  Outcome: Completed/Met Date Met:  06/29/14  Problem: RH Toileting Goal: LTG Patient will perform toileting w/assist, cues/equip (OT) LTG: Patient will perform toiletiing (clothes management/hygiene) with assist, with/without cues using equipment (OT)  Outcome: Completed/Met Date Met:  06/29/14  Problem: RH Toilet Transfers Goal: LTG Patient will perform toilet transfers w/assist (OT) LTG: Patient will perform toilet transfers with assist, with/without cues using equipment (OT)  Outcome: Completed/Met Date Met:  06/29/14  Problem: RH Tub/Shower Transfers Goal: LTG Patient will perform tub/shower transfers w/assist (OT) LTG: Patient will perform tub/shower transfers with assist, with/without cues using equipment (OT)  Outcome: Completed/Met Date Met:  06/29/14

## 2014-06-29 NOTE — Plan of Care (Signed)
Problem: RH BLADDER ELIMINATION Goal: RH STG MANAGE BLADDER WITH ASSISTANCE STG Manage Bladder With Assistance. Mod I  Outcome: Progressing     

## 2014-06-29 NOTE — Progress Notes (Signed)
[place] PHYSICAL MEDICINE & REHABILITATION     PROGRESS NOTE    Subjective/Complaints: No issues overnite, has more thigh   Review of Systems - Negative except RIght hip pain with PT activities  Objective: Vital Signs: Blood pressure 129/58, pulse 57, temperature 97.9 F (36.6 C), temperature source Oral, resp. rate 18, height 5\' 1"  (1.549 m), weight 92.3 kg (203 lb 7.8 oz), SpO2 94 %. No results found.  Recent Labs  06/27/14 0808  WBC 5.6  HGB 10.9*  HCT 33.4*  PLT 442*    Recent Labs  06/27/14 0808  NA 130*  K 4.1  CL 94*  GLUCOSE 112*  BUN 14  CREATININE 0.63  CALCIUM 9.3   CBG (last 3)  No results for input(s): GLUCAP in the last 72 hours.  Wt Readings from Last 3 Encounters:  06/29/14 92.3 kg (203 lb 7.8 oz)  06/12/14 89.812 kg (198 lb)    Physical Exam:  Constitutional: obese, no distress HENT: oral mucosa pink Head: Normocephalic.  Eyes: EOM are normal.  Gen NAD  Skin:  Erythema, superficial, no drainage, R upper hip wound M/s: right shoulder ROM limited, MIld tenderness to palp R Quads   Psych: pleasant and appropriate  Assessment/Plan: 1. Functional deficits secondary to right subtrochanteric femur fracture which require 3+ hours per day of interdisciplinary therapy in a comprehensive inpatient rehab setting. Physiatrist is providing close team supervision and 24 hour management of active medical problems listed below. Physiatrist and rehab team continue to assess barriers to discharge/monitor patient progress toward functional and medical goals.   FIM: FIM - Bathing Bathing Steps Patient Completed: Chest, Front perineal area (pt washing only 3 body parts) Bathing: 3: Mod-Patient completes 5-7 42f 10 parts or 50-74%  FIM - Upper Body Dressing/Undressing Upper body dressing/undressing steps patient completed: Thread/unthread right bra strap, Thread/unthread left bra strap, Hook/unhook bra, Thread/unthread right sleeve of front  closure shirt/dress, Thread/unthread left sleeve of front closure shirt/dress, Button/unbutton shirt Upper body dressing/undressing: 4: Min-Patient completed 75 plus % of tasks FIM - Lower Body Dressing/Undressing Lower body dressing/undressing steps patient completed: Pull pants up/down (therapist provided more assist d/t decreased time) Lower body dressing/undressing: 1: Total-Patient completed less than 25% of tasks  FIM - Toileting Toileting steps completed by patient: Adjust clothing prior to toileting, Adjust clothing after toileting Toileting Assistive Devices: Grab bar or rail for support Toileting: 3: Mod-Patient completed 2 of 3 steps  FIM - Radio producer Devices: Elevated toilet seat, Grab bars, Insurance account manager Transfers: 4-To toilet/BSC: Min A (steadying Pt. > 75%), 4-From toilet/BSC: Min A (steadying Pt. > 75%)  FIM - Control and instrumentation engineer Devices: Arm rests, Copy: 4: Bed > Chair or W/C: Min A (steadying Pt. > 75%), 4: Chair or W/C > Bed: Min A (steadying Pt. > 75%)  FIM - Locomotion: Wheelchair Distance: 50 Locomotion: Wheelchair: 2: Travels 50 - 149 ft with supervision, cueing or coaxing FIM - Locomotion: Ambulation Locomotion: Ambulation Assistive Devices: Administrator Ambulation/Gait Assistance: 5: Supervision Locomotion: Ambulation: 2: Travels 50 - 149 ft with supervision/safety issues  Comprehension Comprehension Mode: Auditory Comprehension: 6-Follows complex conversation/direction: With extra time/assistive device  Expression Expression Mode: Verbal Expression: 5-Expresses complex 90% of the time/cues < 10% of the time  Social Interaction Social Interaction: 5-Interacts appropriately 90% of the time - Needs monitoring or encouragement for participation or interaction.  Problem Solving Problem Solving: 5-Solves complex 90% of the time/cues < 10% of the time  Memory Memory:  5-Recognizes or recalls 90% of the time/requires cueing < 10% of the time  Medical Problem List and Plan: 1. Functional deficits secondary to right subtrochanteric femur fracture. Status post IM nailing 06/13/2014. Weightbearing as tolerated 2. 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   3. Pain Management: Muscle soreness R quad, likely due to exercise 4. Acute blood loss anemia. hgb have been stable 5. Neuropsych: This patient is capable of making decisions on her own behalf. 6. Skin/Wound Care: routine skin checks, no response to doxy, has PCN allergy, start Vanc and levaquin   , ask Ortho to re eval prior to D/C should be able to switch IV to po 7. Fluids/Electrolytes/Nutrition: follow-up chemistries appropriate today. Strict I and O's 8.Hypertension. No present antihypertensive medication. Patient on Norvasc 2.5 mg twice a day prior to admission and Diovan 320-25 milligrams daily.Monitor with increased mobility and resume antihypertensive medications as needed 9.Overactive Bladder. Ditropan 5 mg daily. Checking pvr's 10.GERD. Protonix 11. Left shoulder contractures secondary to history of severe osteoarthritis and total shoulder arthroplasty LOS (Days) 13 A FACE TO FACE EVALUATION WAS PERFORMED    Fahad Cisse E 06/29/2014 8:01 AM

## 2014-06-29 NOTE — Plan of Care (Signed)
Problem: Consults Goal: Skin Care Protocol Initiated - if Braden Score 18 or less If consults are not indicated, leave blank or document N/A  Outcome: Progressing  Problem: RH PAIN MANAGEMENT Goal: RH STG PAIN MANAGED AT OR BELOW PT'S PAIN GOAL <5  Outcome: Progressing

## 2014-06-29 NOTE — Progress Notes (Signed)
Social Work [name], [place] Social Worker Signed  Patient Care Conference 06/29/2014  2:00 PM    Expand All Collapse All   Inpatient RehabilitationTeam Conference and Plan of Care Update Date: 06/29/2014   Time: 11;45 AM     Patient Name: Meriah Shands       Medical Record Number: [id number]  Date of Birth: January 13, 1929 Sex: Female         Room/Bed: 4M06C/4M06C-01 Payor Info: Payor: MEDICARE / Plan: MEDICARE PART A AND B / Product Type: *No Product type* /    Admitting Diagnosis: subtrachanteric femur fx   Admit Date/Time:  06/16/2014  4:33 PM Admission Comments: No comment available   Primary Diagnosis:  Femur fracture, right Principal Problem: Femur fracture, right    Patient Active Problem List     Diagnosis  Date Noted   .  Hip fracture  06/16/2014   .  Essential hypertension     .  Shoulder joint contracture     .  Protein C deficiency     .  Protein S deficiency     .  Femur fracture, right  06/12/2014   .  History of pulmonary embolus (PE)  06/12/2014     Expected Discharge Date: Expected Discharge Date: 06/30/14  Team Members Present: Physician leading conference: Dr. Alysia Penna Social Worker Present: Ovidio Kin, LCSW Nurse Present: Heather Roberts, RN PT Present: Carney Living, PT;Emily Rinaldo Cloud, PT OT Present: Clyda Greener, OT SLP Present: Gunnar Fusi, SLP PPS Coordinator present : Daiva Nakayama, RN, CRRN        Current Status/Progress  Goal  Weekly Team Focus   Medical     Right hip wound infection  D/C  IV abx   Bowel/Bladder     Continent of bowel and bladder. LBM 06/27/14   Pt to remain continent of bowel and bladder   Monitor   Swallow/Nutrition/ Hydration       na         ADL's     Min A - SBA for ADL tasks at sink side and shower, CGA for shower transfer, CGA - SBA for dynamic standing balance for LB dressing, use of AE for LB dressing   supervision - Min A  endurance, dynamic standing balance, self care with AE traiining, pt and family  education, d/c planning   Mobility     Supervision to min for bed mobility (does not have goal), S for remainder of mobility and min A for car transfers.    S overall, min A car transfer  D/C planning, pt/family education   Communication       na         Safety/Cognition/ Behavioral Observations      no unsafe behaviors         Pain     Hip discomfort managed with Vicodin x 2 tabs  <4  Continue to offer pain medication 1hour prior to initial therapy   Skin     R hip incision x 3, Redness continue to be along incisional line. MASD healed. Bruising to R inner thigh  No additional skin breakdown  Encourage turn q 2hrs      *See Care Plan and progress notes for long and short-term goals.    Barriers to Discharge:  elderly caregiver     Possible Resolutions to Barriers:   D/C in am     Discharge Planning/Teaching Needs:   HOme sister to stay with ehr until not needed.  Sister  is here daily to attend therapies with pt.       Team Discussion:    Reaching her goals-supervision/min level.  Both sister's trained in her care.  Finishing IV antibiotics today and will be on po tomorrow. Medically ready for dc tomorrow.   Revisions to Treatment Plan:    Ready for dc tomorrow.    Continued Need for Acute Rehabilitation Level of Care: The patient requires daily medical management by a physician with specialized training in physical medicine and rehabilitation for the following conditions: Daily direction of a multidisciplinary physical rehabilitation program to ensure safe treatment while eliciting the highest outcome that is of practical value to the patient.: Yes Daily medical management of patient stability for increased activity during participation in an intensive rehabilitation regime.: Yes Daily analysis of laboratory values and/or radiology reports with any subsequent need for medication adjustment of medical intervention for : Post surgical problems  Duan Scharnhorst, Gardiner Rhyme 06/29/2014, 2:00  PM                 [name], [place] Worker Signed  Patient Care Conference 06/22/2014  2:32 PM    Expand All Collapse All   Inpatient RehabilitationTeam Conference and Plan of Care Update Date: 06/22/2014   Time: 11;45 AM     Patient Name: Brenlee Koskela       Medical Record Number: [id number]  Date of Birth: 1929-07-07 Sex: Female         Room/Bed: 4M06C/4M06C-01 Payor Info: Payor: MEDICARE / Plan: MEDICARE PART A AND B / Product Type: *No Product type* /    Admitting Diagnosis: subtrachanteric femur fx   Admit Date/Time:  06/16/2014  4:33 PM Admission Comments: No comment available   Primary Diagnosis:  <principal problem not specified> Principal Problem: <principal problem not specified>    Patient Active Problem List     Diagnosis  Date Noted   .  Hip fracture  06/16/2014   .  Essential hypertension     .  Shoulder joint contracture     .  Protein C deficiency     .  Protein S deficiency     .  Femur fracture, right  06/12/2014   .  History of pulmonary embolus (PE)  06/12/2014     Expected Discharge Date: Expected Discharge Date: 06/30/14  Team Members Present: Physician leading conference: Dr. Alysia Penna Social Worker Present: Ovidio Kin, LCSW Nurse Present: Elliot Cousin, RN PT Present: Cameron Sprang, PT;Kamori Barbier Jari Favre, PT OT Present: Benay Pillow, Maryella Shivers, OT SLP Present: Gunnar Fusi, SLP PPS Coordinator present : Daiva Nakayama, RN, CRRN        Current Status/Progress  Goal  Weekly Team Focus   Medical     Right hip pain  Maintaining comfort to ensure adequate rehabilitation process patient  Increase exercise tolerance   Bowel/Bladder     Continent of bowel and bladder. LBM 06/21/14   Pt to remain continent of bowel and bladder   Assess   Swallow/Nutrition/ Hydration       na         ADL's     Mod A for STS and functional transfers, Balance standing with min A, grooming standing at sink with steady A, min A for UB ADLs,  Mod A for LB dressing and bathing  supervision - Min A  functional transfers, endurance, self care task, AE training, functional balance   Mobility     mod A for bed mobility, esp without bed rails  and HOB flat, mod A for sit<>stand, min A stand pivot transfers, min A gait.  Limited by body habitus, poor endurance and pain in RLE.   S overall, min A car transfer  transfers, bed mobility, RLE strengthening, activity tolerance    Communication       na         Safety/Cognition/ Behavioral Observations      no unsafe behaviors         Pain     Pain managed with Vicodin x 1 q 4hrs prn for R hip discomfort   <4  Offer pain medication 1 hour prior to initial therapy session    Skin     R hip incision x 2, R upper incision with redness along incisional line. Stage 2 pressure ulcer with visible slough, Allevyn dressing to area  No additional skin breakdown  Turn q 2hrs, Assess skin q shift for appropriate healing       *See Care Plan and progress notes for long and short-term goals.    Barriers to Discharge:  Poor endurance, elderly caregiver     Possible Resolutions to Barriers:   Continue rehabilitation program     Discharge Planning/Teaching Needs:   HOme to her home with sister staying with her and assisting with her care-sister is here daily       Team Discussion:    Limited by poor endurance, goals -supervision/min level, pain being managed.  Sister here to attend therapies with pt. Stage 2 on sacrum-dressing changes. Progress is slow but each day is getting better.   Revisions to Treatment Plan:    None    Continued Need for Acute Rehabilitation Level of Care: The patient requires daily medical management by a physician with specialized training in physical medicine and rehabilitation for the following conditions: Daily direction of a multidisciplinary physical rehabilitation program to ensure safe treatment while eliciting the highest outcome that is of practical value to the  patient.: Yes Daily medical management of patient stability for increased activity during participation in an intensive rehabilitation regime.: Yes Daily analysis of laboratory values and/or radiology reports with any subsequent need for medication adjustment of medical intervention for : Other;Post surgical problems  [name] 06/22/2014, 2:32 PM                  Patient ID: Dorthula Rue, female   DOB: 1928/12/22, 81 y.o.   MRN: [id number]

## 2014-06-29 NOTE — Plan of Care (Signed)
Problem: RH SKIN INTEGRITY Goal: RH STG MAINTAIN SKIN INTEGRITY WITH ASSISTANCE STG Maintain Skin Integrity With Assistance. Min A  Outcome: Not Progressing

## 2014-06-29 NOTE — Progress Notes (Signed)
Occupational Therapy Session Note  Patient Details  Name: Marjarie Irion MRN: [id number] Date of Birth: 10/10/1928  Today's Date: 06/29/2014 OT Individual Time: 4388-8757 OT Individual Time Calculation (min): 45 min    Short Term Goals: Week 2:  OT Short Term Goal 1 (Week 2): Continue to progress towards long term goals based on estimated LOS  Skilled Therapeutic Interventions/Progress Updates:    Engaged in ADL retraining with focus on use of AE to increase safety and independence with self-care tasks of bathing and dressing.  Pt received seated on toilet with sister present assisting with hygiene.  Pt ambulated toilet > room shower with RW and supervision requiring min assist when turning to complete transfer in/out of shower. Pt utilized long handled sponge for LB bathing and requiring only intermittent steady assist in standing when completing perineal hygiene.  UB dressing completed with setup assist.  Encouraged pt to utilize reacher when completing LB dressing with pt demonstrating donning brief with reacher with only setup assist.  Pt completed LB dressing with sister due to decreased time.  Sister reports she typically assists with socks and shoes.  Therapy Documentation Precautions:  Precautions Precautions: Fall Precaution Comments: h/o falls Restrictions Weight Bearing Restrictions: Yes RLE Weight Bearing: Weight bearing as tolerated Pain: Pain Assessment Pain Assessment: No/denies pain  See FIM for current functional status  Therapy/Group: Individual Therapy  Simonne Come 06/29/2014, 11:42 AM

## 2014-06-29 NOTE — Progress Notes (Signed)
Occupational Therapy Discharge Summary and OT intervention  Patient Details  Name: Melissa Lambert MRN: [id number] Date of Birth: [birth year]  Today's Date: 06/29/2014 OT Individual Time: 1355-1510 OT Individual Time Calculation (min): 75 min    Patient has met 8 of 8 long term goals due to improved activity tolerance, improved balance, postural control and improved strength and endurance.  Patient to discharge at [facility] Assist level.  Patient's care partner is independent to provide the necessary physical assistance at discharge.    Reasons goals not met: All goals met  Recommendation:  Patient will benefit from ongoing skilled OT services in home health setting to continue to advance functional skills in the area of BADL.  Equipment: OT recommends TTB and BSC  Reasons for discharge: treatment goals met  Patient/family agrees with progress made and goals achieved: Yes   OT intervention: Upon entering the room, pt seated in wheelchair with sister(caregiver) present in room. Pt reports 2/10 pain in R hip described as dull ache and reports taking medication prior to therapist arrival. Pt propelled wheelchair with B feet and arms to ADL apartment ~ 150 feet with increased time secondary to fatigue. Pt ambulated into bathroom with RW and CGA for transfer onto TTB with Min A for R LE and pt use of leg lifter as needed. Pt requiring increased time for task secondary to pain and decreased strength. Pt ambulating ~ 50 feet with RW and SBA- CGA before requiring break. Therapist assisted pt back to room in chair. Family education as sister provided supervision and CGA for pt to ambulate to bathroom for toileting and toilet transfer. Pt requiring steady assist during clothing management and increased time. Pt ambulate to recliner and positioned with B feet elevated and ice pack applied to R hip. Pt and family member reporting no additional concerns and caregiver independent with pt assistance.  Call  bell and all needed items within reach upon exiting the room.   OT Discharge Precautions/Restrictions  Precautions Precautions: Fall Precaution Comments: multiple falls prior to admission Restrictions Weight Bearing Restrictions: Yes RLE Weight Bearing: Weight bearing as tolerated Pain Pain Assessment Pain Assessment: 0-10 Pain Score: 2  Pain Type: Surgical pain Pain Location: Hip Pain Orientation: Right Pain Descriptors / Indicators: Aching Pain Onset: On-going Patients Stated Pain Goal: 0 Pain Intervention(s): Other (Comment) (RN reports medication given prior to therapist arrival) Multiple Pain Sites: No Cognition Overall Cognitive Status: Within Functional Limits for tasks assessed Arousal/Alertness: Awake/alert Orientation Level: Oriented X4 Attention: Alternating Selective Attention: Appears intact Alternating Attention: Appears intact Memory: Impaired Memory Impairment: Decreased recall of new information Awareness: Appears intact Problem Solving: Appears intact Safety/Judgment: Appears intact Comments: Pt better able to recall hand placement and safety with standing and transfers.  Sensation Sensation Light Touch: Appears Intact Stereognosis: Not tested Hot/Cold: Appears Intact Proprioception: Appears Intact Coordination Gross Motor Movements are Fluid and Coordinated: Yes Fine Motor Movements are Fluid and Coordinated: No Coordination and Movement Description: decreased secondary to pain and strength Motor  Motor Motor - Discharge Observations: Pt with continued R LE pain and decreased strength causing decreased in functional mobility and balance Mobility  Transfers Transfers: Sit to Stand Sit to Stand: 5: Supervision Sit to Stand Details: Verbal cues for sequencing;Verbal cues for technique;Verbal cues for precautions/safety Stand to Sit: 5: Supervision Stand to Sit Details (indicate cue type and reason): Verbal cues for sequencing;Verbal cues for  technique;Verbal cues for precautions/safety  Trunk/Postural Assessment  Cervical Assessment Cervical Assessment: Exceptions to [facility] Cervical Strength Overall  Cervical Strength Comments: flexed, forward posture Thoracic Assessment Thoracic Assessment: Exceptions to [facility] Thoracic Strength Overall Thoracic Strength Comments: kyphotic Lumbar Assessment Lumbar Assessment: Exceptions to [facility] Lumbar Strength Overall Lumbar Strength Comments: posterior pelvic tilt Postural Control Postural Control: Deficits on evaluation Postural Limitations: decreased ability to maintain upright posture  Balance Balance Balance Assessed: Yes Dynamic Standing Balance Dynamic Standing - Balance Support: During functional activity;Left upper extremity supported Dynamic Standing - Level of Assistance: 5: Stand by assistance Extremity/Trunk Assessment RUE Assessment RUE Assessment: Within Functional Limits LUE Assessment LUE Assessment: Within Functional Limits  See FIM for current functional status  Phineas Semen 06/29/2014, 4:10 PM

## 2014-06-29 NOTE — Plan of Care (Signed)
Problem: RH Balance Goal: LTG Patient will maintain dynamic standing balance (PT) LTG: Patient will maintain dynamic standing balance with assistance during mobility activities (PT)  Outcome: Completed/Met Date Met:  06/29/14  Problem: RH Bed to Chair Transfers Goal: LTG Patient will perform bed/chair transfers w/assist (PT) LTG: Patient will perform bed/chair transfers with assistance, with/without cues (PT).  Outcome: Completed/Met Date Met:  06/29/14  Problem: RH Car Transfers Goal: LTG Patient will perform car transfers with assist (PT) LTG: Patient will perform car transfers with assistance (PT).  Outcome: Completed/Met Date Met:  06/29/14  Problem: RH Furniture Transfers Goal: LTG Patient will perform furniture transfers w/assist (OT/PT LTG: Patient will perform furniture transfers with assistance (OT/PT).  Outcome: Completed/Met Date Met:  06/29/14  Problem: RH Ambulation Goal: LTG Patient will ambulate in controlled environment (PT) LTG: Patient will ambulate in a controlled environment, # of feet with assistance (PT).  Outcome: Completed/Met Date Met:  06/29/14 Goal: LTG Patient will ambulate in home environment (PT) LTG: Patient will ambulate in home environment, # of feet with assistance (PT).  Outcome: Completed/Met Date Met:  06/29/14  Problem: RH Wheelchair Mobility Goal: LTG Patient will propel w/c in controlled environment (PT) LTG: Patient will propel wheelchair in controlled environment, # of feet with assist (PT)  Outcome: Completed/Met Date Met:  06/29/14 Goal: LTG Patient will propel w/c in home environment (PT) LTG: Patient will propel wheelchair in home environment, # of feet with assistance (PT).  Outcome: Completed/Met Date Met:  06/29/14

## 2014-06-30 LAB — PROTIME-INR
INR: 2.34 — AB (ref 0.00–1.49)
Prothrombin Time: 25.9 seconds — ABNORMAL HIGH (ref 11.6–15.2)

## 2014-06-30 MED ORDER — HYDROCODONE-ACETAMINOPHEN 5-325 MG PO TABS: 1.0000 | ORAL_TABLET | Freq: Four times a day (QID) | ORAL | Status: DC | PRN

## 2014-06-30 MED ORDER — WARFARIN SODIUM 5 MG PO TABS
5.0000 mg | ORAL_TABLET | Freq: Every day | ORAL | Status: DC
  Filled 2014-06-30: qty 1

## 2014-06-30 MED ORDER — OXYBUTYNIN CHLORIDE ER 5 MG PO TB24: 5.0000 mg | ORAL_TABLET | Freq: Every day | ORAL | Status: DC

## 2014-06-30 MED ORDER — WARFARIN SODIUM 5 MG PO TABS: 5.0000 mg | ORAL_TABLET | Freq: Every day | ORAL | Status: DC

## 2014-06-30 MED ORDER — LEVOFLOXACIN 500 MG PO TABS: 500.0000 mg | ORAL_TABLET | Freq: Every day | ORAL | Status: DC

## 2014-06-30 MED ORDER — OMEPRAZOLE MAGNESIUM 20 MG PO TBEC: 20.0000 mg | DELAYED_RELEASE_TABLET | Freq: Every day | ORAL | Status: DC

## 2014-06-30 MED ORDER — WARFARIN SODIUM 5 MG PO TABS
5.0000 mg | ORAL_TABLET | Freq: Once | ORAL | Status: DC
  Filled 2014-06-30: qty 1

## 2014-06-30 MED ORDER — LEVOFLOXACIN 500 MG PO TABS
500.0000 mg | ORAL_TABLET | Freq: Every day | ORAL | Status: DC
  Administered 2014-06-30: 500 mg via ORAL
  Filled 2014-06-30 (×3): qty 1

## 2014-06-30 NOTE — Progress Notes (Signed)
ANTICOAGULATION CONSULT NOTE - Follow Up Consult  Pharmacy Consult for coumadin and vancomycin Indication: hx of PE and protein C & S deficiencies and wound infection  Allergies  Allergen Reactions  . Adhesive [Tape] Itching  . Penicillins Itching, Swelling and Rash    Streptomycin also  . Tuberculin Tests Itching, Swelling and Rash    Patient Measurements: Height: 5\' 1"  (154.9 cm) Weight: 203 lb 7.8 oz (92.3 kg) IBW/kg (Calculated) : 47.8 Heparin Dosing Weight:   Vital Signs: Temp: 98.1 F (36.7 C) (11/19 0536) Temp Source: Oral (11/19 0536) BP: 129/53 mmHg (11/19 0536) Pulse Rate: 63 (11/19 0536)  Labs:  Recent Labs  06/28/14 0620 06/29/14 0418 06/30/14 0525  LABPROT 27.3* 25.6* 25.9*  INR 2.51* 2.31* 2.34*    Estimated Creatinine Clearance: 55.2 mL/min (by C-G formula based on Cr of 0.63).   Medications:  Scheduled:  . calcium-vitamin D  1 tablet Oral BID  . docusate sodium  100 mg Oral BID  . levofloxacin (LEVAQUIN) IV  500 mg Intravenous Q24H  . loratadine  10 mg Oral Daily  . oxybutynin  5 mg Oral Daily  . pantoprazole  40 mg Oral Daily  . senna  2 tablet Oral BID  . triamcinolone   Topical BID  . vancomycin  1,250 mg Intravenous Q24H  . Warfarin - Pharmacist Dosing Inpatient   Does not apply q1800   Infusions:    Assessment: 80 yo female with hx of PE and protein C & S deficiencies and wound infection is currently on therapeutic coumadin and vancomycin (day 4), respectively.  Urinated 4x yesterday.    Goal of Therapy:  INR 2-3; vancomycin trough 10-15 Monitor platelets by anticoagulation protocol: Yes   Plan:  - Coumadin 5 mg po x1 - Cont vancomycin 1250 mg iv q24h - Vancomycin trough tonight at 1730  Daray Polgar, Tsz-Yin 06/30/2014,8:19 AM

## 2014-06-30 NOTE — Progress Notes (Signed)
Social Work Discharge Note Discharge Note  The overall goal for the admission was met for:   Discharge location: [place].  Length of Stay: Yes-14 DAYS  Discharge activity level: Yes-SUPERVISION/MIN LEVEL  Home/community participation: Yes  Services provided included: MD, RD, PT, OT, RN, CM, TR, Pharmacy and SW  Financial Services: Medicare and Private Insurance: [place]  Follow-up services arranged: Home Health: [place] and Patient/Family request agency HH: SISTER TO GET ON OWN, DME: PREG GENTIVA HAD BEFORE  Comments (or additional information):FAMILY EDUCATION WITH BOTH SISTER'S ALL FEEL [place]  Patient/Family verbalized understanding of follow-up arrangements: Yes  Individual responsible for coordination of the follow-up plan: SELF & JACQUELINE-SISTER  Confirmed correct DME delivered: [name] 06/30/2014    [name]

## 2014-06-30 NOTE — Progress Notes (Signed)
[facility]     PROGRESS NOTE    Subjective/Complaints: No issues overnite, Right hip soreness but overall better  Review of Systems - Negative except RIght hip pain with PT activities  Objective: Vital Signs: Blood pressure 129/53, pulse 63, temperature 98.1 F (36.7 C), temperature source Oral, resp. rate 19, height 5\' 1"  (1.549 m), weight 92.3 kg (203 lb 7.8 oz), SpO2 90 %. No results found.  Recent Labs  06/27/14 0808  WBC 5.6  HGB 10.9*  HCT 33.4*  PLT 442*    Recent Labs  06/27/14 0808  NA 130*  K 4.1  CL 94*  GLUCOSE 112*  BUN 14  CREATININE 0.63  CALCIUM 9.3   CBG (last 3)  No results for input(s): GLUCAP in the last 72 hours.  Wt Readings from Last 3 Encounters:  06/29/14 92.3 kg (203 lb 7.8 oz)  06/12/14 89.812 kg (198 lb)    Physical Exam:  Constitutional: obese, no distress HENT: oral mucosa pink Head: Normocephalic.  Eyes: EOM are normal.  Gen NAD Lungs clear Cor RRR Abd soft NT Skin:  Less Erythema, superficial, no drainage, R upper hip wound  M/s: right shoulder ROM limited, MIld tenderness to palp R Quads   Psych: pleasant and appropriate  Assessment/Plan: 1. Functional deficits secondary to right subtrochanteric femur fracture Stable for D/C today F/u PCP in 1-2 weeks F/u Ortho See D/C summary See D/C instructions FIM: FIM - Bathing Bathing Steps Patient Completed: Chest, Right Arm, Left Arm, Abdomen, Front perineal area, Buttocks, Right upper leg, Left upper leg, Right lower leg (including foot), Left lower leg (including foot) Bathing: 4: Steadying assist  FIM - Upper Body Dressing/Undressing Upper body dressing/undressing steps patient completed: Thread/unthread right bra strap, Thread/unthread left bra strap, Hook/unhook bra, Thread/unthread right sleeve of pullover shirt/dresss, Thread/unthread left sleeve of pullover shirt/dress, Put head through opening of pull over shirt/dress,  Pull shirt over trunk Upper body dressing/undressing: 5: Set-up assist to: Obtain clothing/put away FIM - Lower Body Dressing/Undressing Lower body dressing/undressing steps patient completed: Thread/unthread right underwear leg, Thread/unthread left underwear leg, Pull underwear up/down, Thread/unthread right pants leg, Thread/unthread left pants leg, Pull pants up/down Lower body dressing/undressing: 4: Min-Patient completed 75 plus % of tasks  FIM - Toileting Toileting steps completed by patient: Adjust clothing prior to toileting, Performs perineal hygiene, Adjust clothing after toileting Toileting Assistive Devices: Grab bar or rail for support Toileting: 4: Steadying assist  FIM - Radio producer Devices: Elevated toilet seat, Grab bars, Insurance account manager Transfers: 5-To toilet/BSC: Supervision (verbal cues/safety issues), 5-From toilet/BSC: Supervision (verbal cues/safety issues)  FIM - Control and instrumentation engineer Devices: Arm rests, Copy: 5: Supine > Sit: Supervision (verbal cues/safety issues), 4: Sit > Supine: Min A (steadying pt. > 75%/lift 1 leg), 5: Bed > Chair or W/C: Supervision (verbal cues/safety issues), 5: Chair or W/C > Bed: Supervision (verbal cues/safety issues)  FIM - Locomotion: Wheelchair Distance: 50 Locomotion: Wheelchair: 2: Travels 26 - 149 ft with supervision, cueing or coaxing FIM - Locomotion: Ambulation Locomotion: Ambulation Assistive Devices: Administrator Ambulation/Gait Assistance: 5: Supervision Locomotion: Ambulation: 2: Travels 50 - 149 ft with supervision/safety issues  Comprehension Comprehension Mode: Auditory Comprehension: 6-Follows complex conversation/direction: With extra time/assistive device  Expression Expression Mode: Verbal Expression: 5-Expresses complex 90% of the time/cues < 10% of the time  Social Interaction Social Interaction: 5-Interacts appropriately 90%  of the time - Needs monitoring or encouragement for participation or  interaction.  Problem Solving Problem Solving: 5-Solves complex 90% of the time/cues < 10% of the time  Memory Memory: 5-Recognizes or recalls 90% of the time/requires cueing < 10% of the time  Medical Problem List and Plan: 1. Functional deficits secondary to right subtrochanteric femur fracture. Status post IM nailing 06/13/2014. Weightbearing as tolerated 2. 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   3. Pain Management: Muscle soreness R quad, likely due to exercise 4. Acute blood loss anemia. hgb have been stable 5. Neuropsych: This patient is capable of making decisions on her own behalf. 6. Skin/Wound Care: routine skin checks, no response to doxy, has PCN allergy, start Vanc and levaquin   , ask Ortho to re eval prior to D/C should be able to switch IV to po 7. Fluids/Electrolytes/Nutrition: follow-up chemistries appropriate today. Strict I and O's 8.Hypertension. No present antihypertensive medication. Patient on Norvasc 2.5 mg twice a day prior to admission and Diovan 320-25 milligrams daily.Monitor with increased mobility and resume antihypertensive medications as needed 9.Overactive Bladder. Ditropan 5 mg daily. Checking pvr's 10.GERD. Protonix 11. Left shoulder contractures secondary to history of severe osteoarthritis and total shoulder arthroplasty LOS (Days) 14 A FACE TO FACE EVALUATION WAS PERFORMED    KIRSTEINS,ANDREW E 06/30/2014 7:01 AM

## 2014-06-30 NOTE — Progress Notes (Signed)
Discharged to home accompanied by family. Discharge instrctions given to patient by Dan Zimbabwe PA; not questions noted.. Belongings packed and sent with pt. Melissa Lambert

## 2014-06-30 NOTE — Plan of Care (Signed)
Problem: RH BLADDER ELIMINATION Goal: RH STG MANAGE BLADDER WITH ASSISTANCE STG Manage Bladder With Assistance. Mod I  Outcome: Completed/Met Date Met:  06/30/14  Problem: RH SKIN INTEGRITY Goal: RH STG MAINTAIN SKIN INTEGRITY WITH ASSISTANCE STG Maintain Skin Integrity With Assistance. Min A  Outcome: Completed/Met Date Met:  06/30/14  Problem: RH PAIN MANAGEMENT Goal: RH STG PAIN MANAGED AT OR BELOW PT'S PAIN GOAL <5  Outcome: Completed/Met Date Met:  06/30/14

## 2014-07-29 ENCOUNTER — Other Ambulatory Visit: Payer: Self-pay | Admitting: Physical Medicine & Rehabilitation

## 2014-08-29 ENCOUNTER — Other Ambulatory Visit: Payer: Self-pay | Admitting: Physical Medicine & Rehabilitation

## 2014-09-28 ENCOUNTER — Other Ambulatory Visit: Payer: Self-pay | Admitting: Physical Medicine & Rehabilitation

## 2015-08-25 DIAGNOSIS — J209 Acute bronchitis, unspecified: Secondary | ICD-10-CM | POA: Diagnosis not present

## 2015-08-25 DIAGNOSIS — I10 Essential (primary) hypertension: Secondary | ICD-10-CM | POA: Diagnosis not present

## 2015-08-25 DIAGNOSIS — Z6836 Body mass index (BMI) 36.0-36.9, adult: Secondary | ICD-10-CM | POA: Diagnosis not present

## 2015-08-25 DIAGNOSIS — Z789 Other specified health status: Secondary | ICD-10-CM | POA: Diagnosis not present

## 2015-09-08 DIAGNOSIS — Z7901 Long term (current) use of anticoagulants: Secondary | ICD-10-CM | POA: Diagnosis not present

## 2015-09-08 DIAGNOSIS — K219 Gastro-esophageal reflux disease without esophagitis: Secondary | ICD-10-CM | POA: Diagnosis not present

## 2015-09-08 DIAGNOSIS — R2681 Unsteadiness on feet: Secondary | ICD-10-CM | POA: Diagnosis not present

## 2015-09-08 DIAGNOSIS — M1990 Unspecified osteoarthritis, unspecified site: Secondary | ICD-10-CM | POA: Diagnosis not present

## 2015-09-08 DIAGNOSIS — Z86711 Personal history of pulmonary embolism: Secondary | ICD-10-CM | POA: Diagnosis not present

## 2015-09-08 DIAGNOSIS — I10 Essential (primary) hypertension: Secondary | ICD-10-CM | POA: Diagnosis not present

## 2015-09-08 DIAGNOSIS — Z79899 Other long term (current) drug therapy: Secondary | ICD-10-CM | POA: Diagnosis not present

## 2015-09-08 DIAGNOSIS — Z418 Encounter for other procedures for purposes other than remedying health state: Secondary | ICD-10-CM | POA: Diagnosis not present

## 2015-09-08 DIAGNOSIS — T161XXA Foreign body in right ear, initial encounter: Secondary | ICD-10-CM | POA: Diagnosis not present

## 2015-09-08 DIAGNOSIS — I2699 Other pulmonary embolism without acute cor pulmonale: Secondary | ICD-10-CM | POA: Diagnosis not present

## 2015-09-11 DIAGNOSIS — Z1231 Encounter for screening mammogram for malignant neoplasm of breast: Secondary | ICD-10-CM | POA: Diagnosis not present

## 2015-09-12 DIAGNOSIS — Z961 Presence of intraocular lens: Secondary | ICD-10-CM | POA: Diagnosis not present

## 2015-09-12 DIAGNOSIS — H26491 Other secondary cataract, right eye: Secondary | ICD-10-CM | POA: Diagnosis not present

## 2015-10-04 DIAGNOSIS — Z85828 Personal history of other malignant neoplasm of skin: Secondary | ICD-10-CM | POA: Diagnosis not present

## 2015-10-04 DIAGNOSIS — D1801 Hemangioma of skin and subcutaneous tissue: Secondary | ICD-10-CM | POA: Diagnosis not present

## 2015-10-04 DIAGNOSIS — L812 Freckles: Secondary | ICD-10-CM | POA: Diagnosis not present

## 2015-10-04 DIAGNOSIS — L821 Other seborrheic keratosis: Secondary | ICD-10-CM | POA: Diagnosis not present

## 2015-10-09 DIAGNOSIS — I2699 Other pulmonary embolism without acute cor pulmonale: Secondary | ICD-10-CM | POA: Diagnosis not present

## 2015-10-09 DIAGNOSIS — R2681 Unsteadiness on feet: Secondary | ICD-10-CM | POA: Diagnosis not present

## 2015-10-09 DIAGNOSIS — M25569 Pain in unspecified knee: Secondary | ICD-10-CM | POA: Diagnosis not present

## 2015-11-06 DIAGNOSIS — R05 Cough: Secondary | ICD-10-CM | POA: Diagnosis not present

## 2015-11-06 DIAGNOSIS — I2699 Other pulmonary embolism without acute cor pulmonale: Secondary | ICD-10-CM | POA: Diagnosis not present

## 2015-12-07 DIAGNOSIS — R2681 Unsteadiness on feet: Secondary | ICD-10-CM | POA: Diagnosis not present

## 2015-12-07 DIAGNOSIS — I2699 Other pulmonary embolism without acute cor pulmonale: Secondary | ICD-10-CM | POA: Diagnosis not present

## 2015-12-07 DIAGNOSIS — Z299 Encounter for prophylactic measures, unspecified: Secondary | ICD-10-CM | POA: Diagnosis not present

## 2015-12-07 DIAGNOSIS — M25569 Pain in unspecified knee: Secondary | ICD-10-CM | POA: Diagnosis not present

## 2016-01-02 DIAGNOSIS — I2699 Other pulmonary embolism without acute cor pulmonale: Secondary | ICD-10-CM | POA: Diagnosis not present

## 2016-01-09 DIAGNOSIS — R2689 Other abnormalities of gait and mobility: Secondary | ICD-10-CM | POA: Diagnosis not present

## 2016-01-31 DIAGNOSIS — I10 Essential (primary) hypertension: Secondary | ICD-10-CM | POA: Diagnosis not present

## 2016-01-31 DIAGNOSIS — M159 Polyosteoarthritis, unspecified: Secondary | ICD-10-CM | POA: Diagnosis not present

## 2016-02-01 DIAGNOSIS — R2689 Other abnormalities of gait and mobility: Secondary | ICD-10-CM | POA: Diagnosis not present

## 2016-02-05 DIAGNOSIS — I2699 Other pulmonary embolism without acute cor pulmonale: Secondary | ICD-10-CM | POA: Diagnosis not present

## 2016-02-05 DIAGNOSIS — I10 Essential (primary) hypertension: Secondary | ICD-10-CM | POA: Diagnosis not present

## 2016-02-05 DIAGNOSIS — R2689 Other abnormalities of gait and mobility: Secondary | ICD-10-CM | POA: Diagnosis not present

## 2016-02-09 DIAGNOSIS — R2689 Other abnormalities of gait and mobility: Secondary | ICD-10-CM | POA: Diagnosis not present

## 2016-02-12 DIAGNOSIS — R2689 Other abnormalities of gait and mobility: Secondary | ICD-10-CM | POA: Diagnosis not present

## 2016-02-16 DIAGNOSIS — R2689 Other abnormalities of gait and mobility: Secondary | ICD-10-CM | POA: Diagnosis not present

## 2016-02-19 DIAGNOSIS — R2689 Other abnormalities of gait and mobility: Secondary | ICD-10-CM | POA: Diagnosis not present

## 2016-02-23 DIAGNOSIS — R2689 Other abnormalities of gait and mobility: Secondary | ICD-10-CM | POA: Diagnosis not present

## 2016-02-27 DIAGNOSIS — R2689 Other abnormalities of gait and mobility: Secondary | ICD-10-CM | POA: Diagnosis not present

## 2016-02-28 DIAGNOSIS — R2689 Other abnormalities of gait and mobility: Secondary | ICD-10-CM | POA: Diagnosis not present

## 2016-03-04 DIAGNOSIS — Z299 Encounter for prophylactic measures, unspecified: Secondary | ICD-10-CM | POA: Diagnosis not present

## 2016-03-04 DIAGNOSIS — I2699 Other pulmonary embolism without acute cor pulmonale: Secondary | ICD-10-CM | POA: Diagnosis not present

## 2016-03-04 DIAGNOSIS — R2689 Other abnormalities of gait and mobility: Secondary | ICD-10-CM | POA: Diagnosis not present

## 2016-04-01 IMAGING — CR DG CHEST 1V
1 series · 1 of 1 positions shown · non-contrast
Comparison: 06/12/2014.

CLINICAL DATA: Fall. Initial encounter. RIGHT hip pain radiating to
the knee. Preoperative chest radiograph.

EXAM:
CHEST - 1 VIEW

[t chest supine]
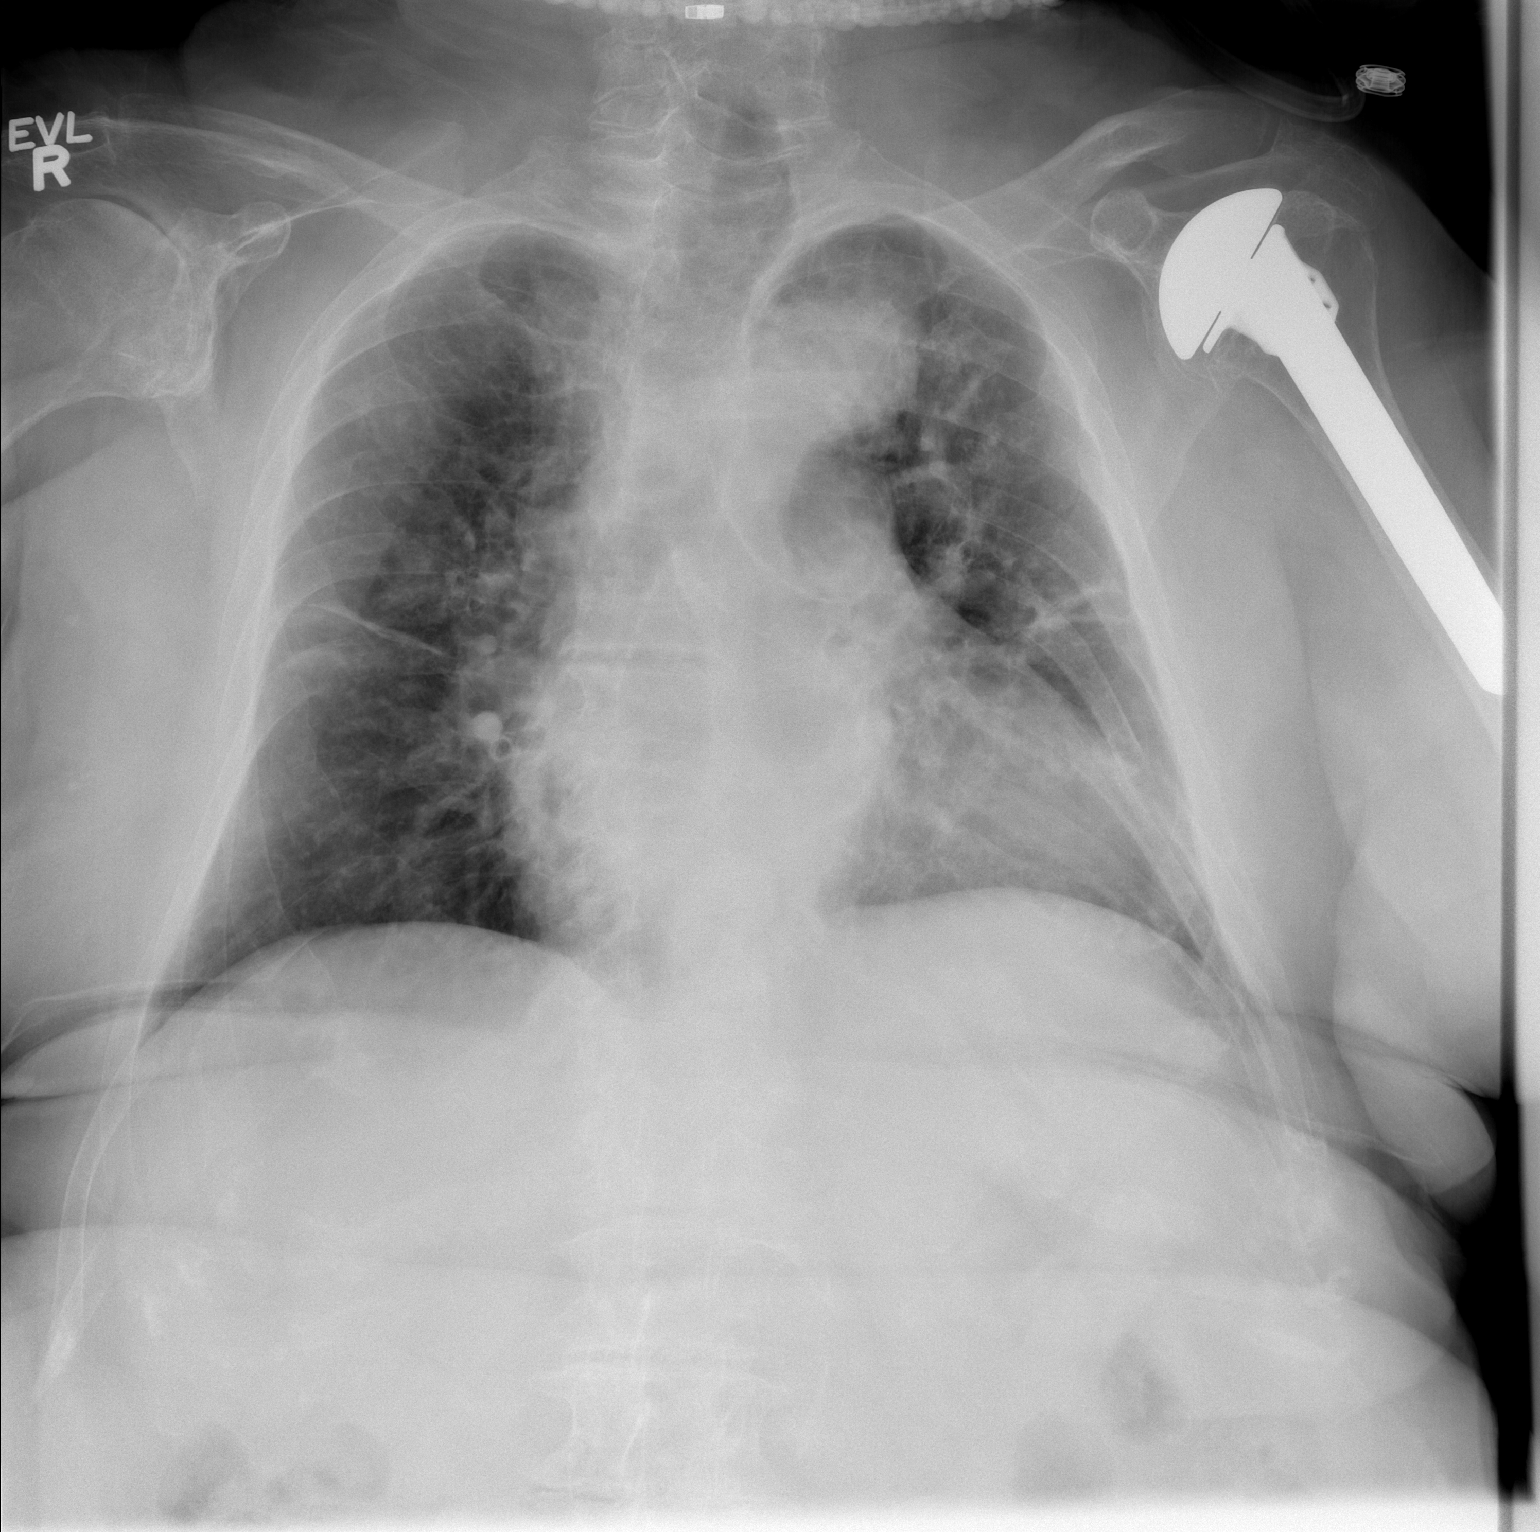

[1 of 1 positions shown; findings below may reference images not displayed]

FINDINGS: Cardiomegaly. Tortuous thoracic aorta. LEFT shoulder
hemiarthroplasty. RIGHT shoulder osteoarthritis incidentally noted.
There is no airspace disease, pneumothorax or displaced rib
fractures identified non this frontal view of the chest.
Subsegmental atelectasis and/ or scarring is present in the mid
chest bilaterally.
IMPRESSION: 1. Cardiomegaly without failure.
2. Scarring or subsegmental atelectasis in the lungs bilaterally.

## 2016-04-02 IMAGING — RF DG FEMUR 2+V*R*
1 series · 4 of 4 positions shown · non-contrast
Comparison: 06/12/2014 radiographs

CLINICAL DATA: Femur fracture, pain, initial encounter.

EXAM:
RIGHT FEMUR - 2 VIEW; DG C-ARM 61-120 MIN

[Series 1: run · 4 of 4 slices shown]
[im 1/4]
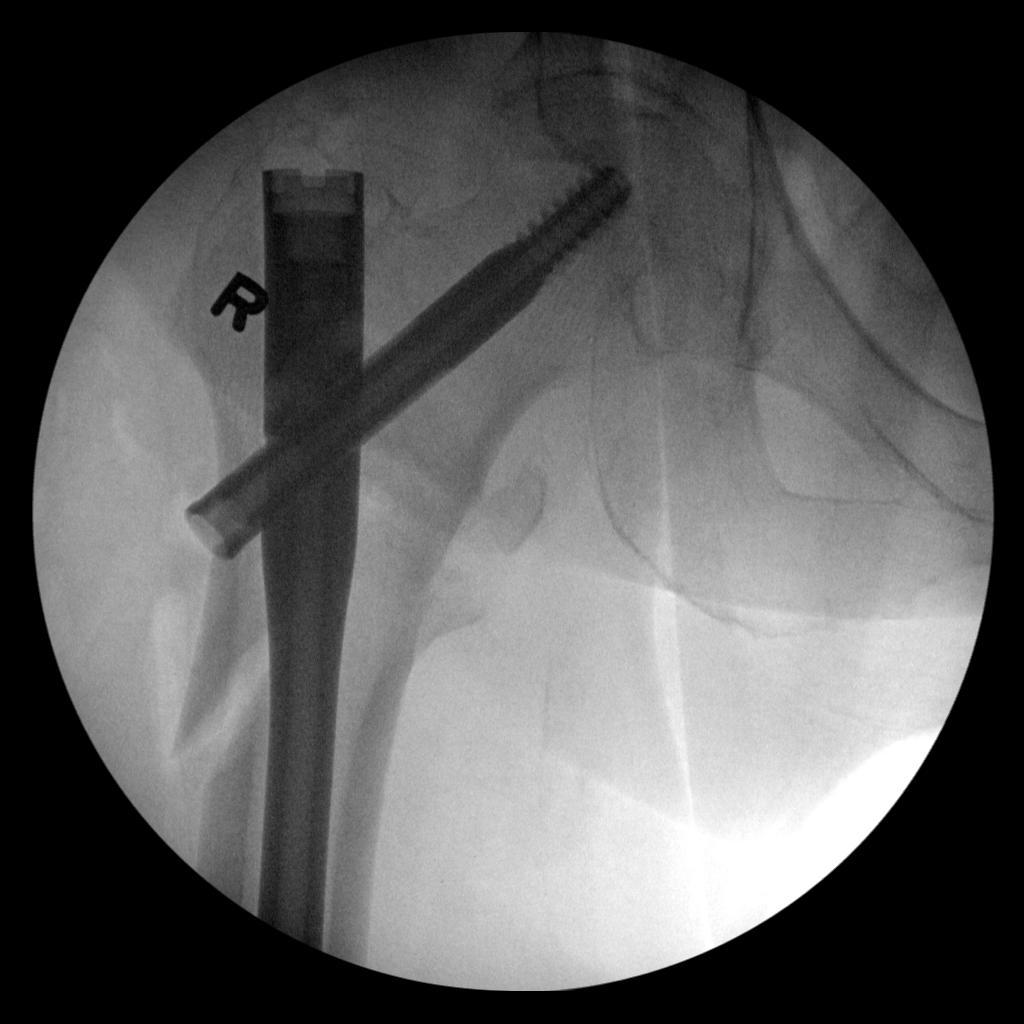
[im 2/4]
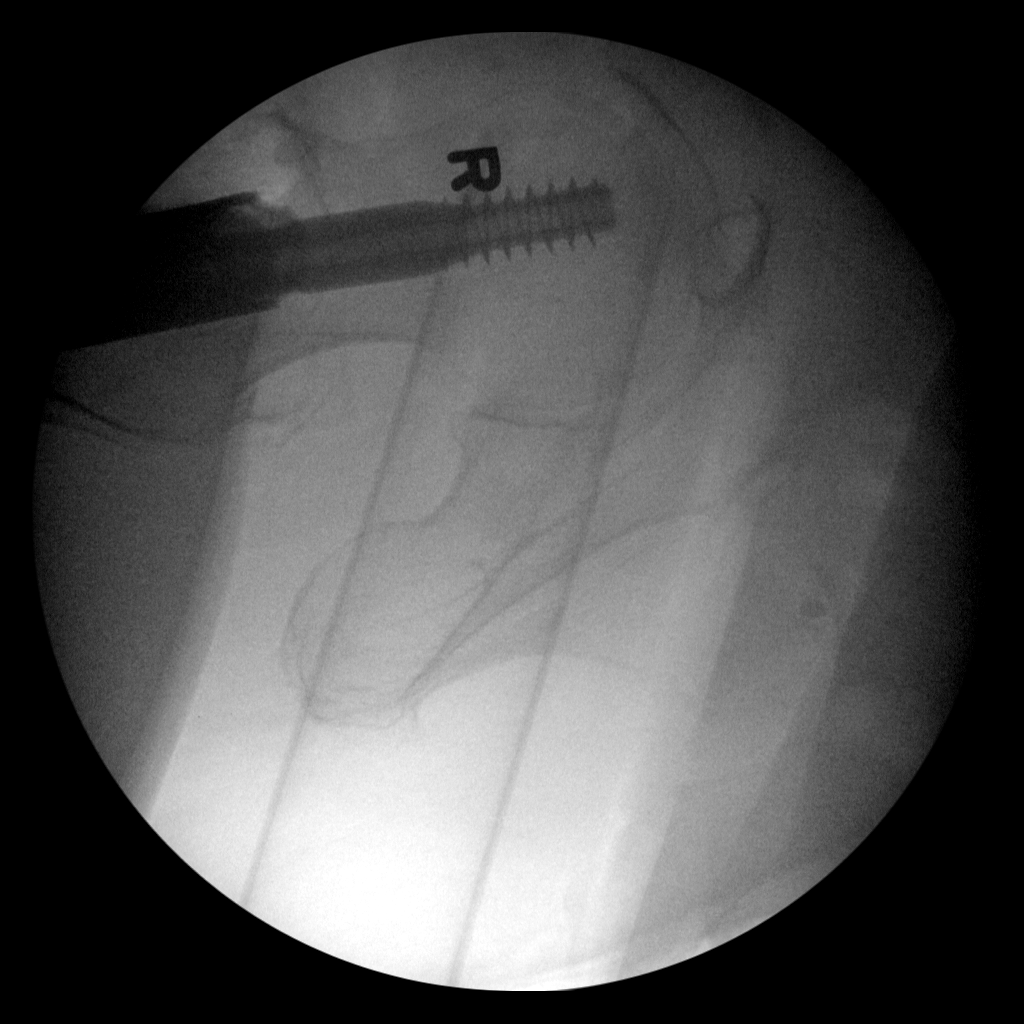
[im 3/4]
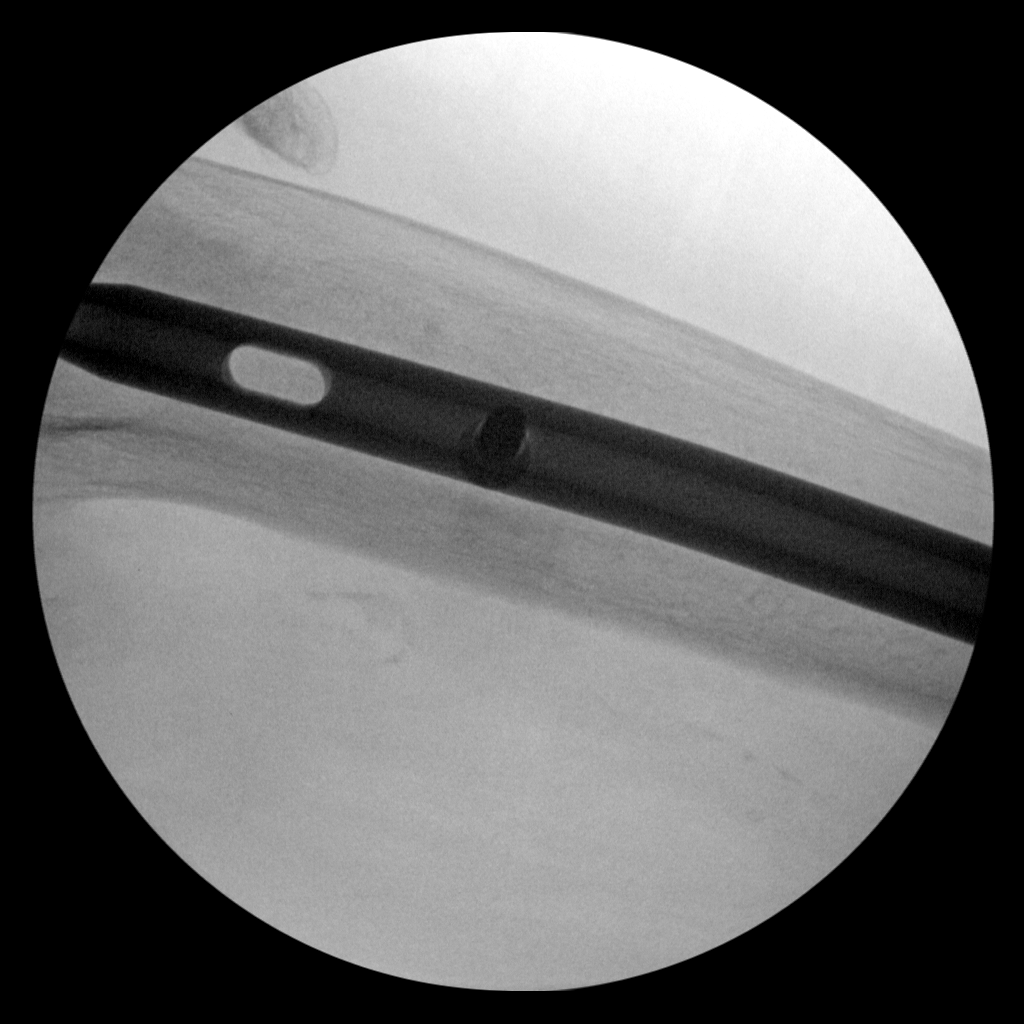
[im 4/4]
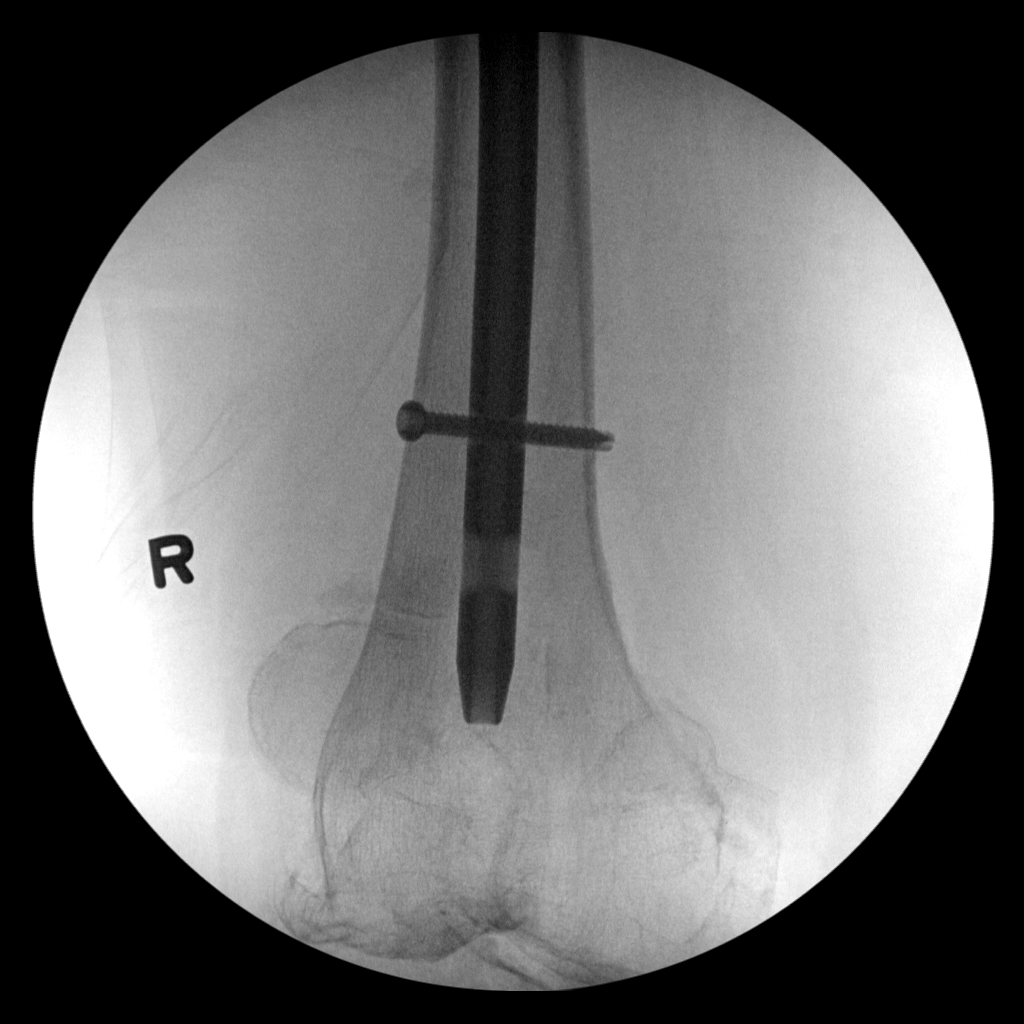

[4 of 4 positions shown; findings below may reference images not displayed]

FINDINGS: C-arm films document placement of an intramedullary rod with
compression screw across a proximal femur fracture. Improved
position and alignment.
IMPRESSION: As above.

## 2016-04-08 DIAGNOSIS — I2699 Other pulmonary embolism without acute cor pulmonale: Secondary | ICD-10-CM | POA: Diagnosis not present

## 2016-04-26 DIAGNOSIS — Z23 Encounter for immunization: Secondary | ICD-10-CM | POA: Diagnosis not present

## 2016-05-10 DIAGNOSIS — Z1211 Encounter for screening for malignant neoplasm of colon: Secondary | ICD-10-CM | POA: Diagnosis not present

## 2016-05-10 DIAGNOSIS — Z299 Encounter for prophylactic measures, unspecified: Secondary | ICD-10-CM | POA: Diagnosis not present

## 2016-05-10 DIAGNOSIS — Z6836 Body mass index (BMI) 36.0-36.9, adult: Secondary | ICD-10-CM | POA: Diagnosis not present

## 2016-05-10 DIAGNOSIS — I2699 Other pulmonary embolism without acute cor pulmonale: Secondary | ICD-10-CM | POA: Diagnosis not present

## 2016-05-10 DIAGNOSIS — Z0000 Encounter for general adult medical examination without abnormal findings: Secondary | ICD-10-CM | POA: Diagnosis not present

## 2016-05-10 DIAGNOSIS — Z7189 Other specified counseling: Secondary | ICD-10-CM | POA: Diagnosis not present

## 2016-05-10 DIAGNOSIS — Z1389 Encounter for screening for other disorder: Secondary | ICD-10-CM | POA: Diagnosis not present

## 2016-05-14 DIAGNOSIS — R5383 Other fatigue: Secondary | ICD-10-CM | POA: Diagnosis not present

## 2016-05-14 DIAGNOSIS — Z713 Dietary counseling and surveillance: Secondary | ICD-10-CM | POA: Diagnosis not present

## 2016-05-14 DIAGNOSIS — Z6836 Body mass index (BMI) 36.0-36.9, adult: Secondary | ICD-10-CM | POA: Diagnosis not present

## 2016-05-14 DIAGNOSIS — E7800 Pure hypercholesterolemia, unspecified: Secondary | ICD-10-CM | POA: Diagnosis not present

## 2016-05-14 DIAGNOSIS — M159 Polyosteoarthritis, unspecified: Secondary | ICD-10-CM | POA: Diagnosis not present

## 2016-05-14 DIAGNOSIS — Z299 Encounter for prophylactic measures, unspecified: Secondary | ICD-10-CM | POA: Diagnosis not present

## 2016-05-14 DIAGNOSIS — I10 Essential (primary) hypertension: Secondary | ICD-10-CM | POA: Diagnosis not present

## 2016-05-14 DIAGNOSIS — E559 Vitamin D deficiency, unspecified: Secondary | ICD-10-CM | POA: Diagnosis not present

## 2016-05-14 DIAGNOSIS — M25559 Pain in unspecified hip: Secondary | ICD-10-CM | POA: Diagnosis not present

## 2016-05-14 DIAGNOSIS — Z79899 Other long term (current) drug therapy: Secondary | ICD-10-CM | POA: Diagnosis not present

## 2016-06-11 DIAGNOSIS — I2699 Other pulmonary embolism without acute cor pulmonale: Secondary | ICD-10-CM | POA: Diagnosis not present

## 2016-06-11 DIAGNOSIS — Z713 Dietary counseling and surveillance: Secondary | ICD-10-CM | POA: Diagnosis not present

## 2016-06-11 DIAGNOSIS — I10 Essential (primary) hypertension: Secondary | ICD-10-CM | POA: Diagnosis not present

## 2016-06-11 DIAGNOSIS — Z299 Encounter for prophylactic measures, unspecified: Secondary | ICD-10-CM | POA: Diagnosis not present

## 2016-06-11 DIAGNOSIS — Z6837 Body mass index (BMI) 37.0-37.9, adult: Secondary | ICD-10-CM | POA: Diagnosis not present

## 2016-07-02 DIAGNOSIS — E2839 Other primary ovarian failure: Secondary | ICD-10-CM | POA: Diagnosis not present

## 2016-07-11 DIAGNOSIS — Z6837 Body mass index (BMI) 37.0-37.9, adult: Secondary | ICD-10-CM | POA: Diagnosis not present

## 2016-07-11 DIAGNOSIS — I2699 Other pulmonary embolism without acute cor pulmonale: Secondary | ICD-10-CM | POA: Diagnosis not present

## 2016-07-11 DIAGNOSIS — Z299 Encounter for prophylactic measures, unspecified: Secondary | ICD-10-CM | POA: Diagnosis not present

## 2016-07-11 DIAGNOSIS — J069 Acute upper respiratory infection, unspecified: Secondary | ICD-10-CM | POA: Diagnosis not present

## 2016-07-11 DIAGNOSIS — I10 Essential (primary) hypertension: Secondary | ICD-10-CM | POA: Diagnosis not present

## 2016-07-17 DIAGNOSIS — I10 Essential (primary) hypertension: Secondary | ICD-10-CM | POA: Diagnosis not present

## 2016-07-17 DIAGNOSIS — M159 Polyosteoarthritis, unspecified: Secondary | ICD-10-CM | POA: Diagnosis not present

## 2016-08-07 DIAGNOSIS — Z299 Encounter for prophylactic measures, unspecified: Secondary | ICD-10-CM | POA: Diagnosis not present

## 2016-08-07 DIAGNOSIS — Z6837 Body mass index (BMI) 37.0-37.9, adult: Secondary | ICD-10-CM | POA: Diagnosis not present

## 2016-08-07 DIAGNOSIS — I2699 Other pulmonary embolism without acute cor pulmonale: Secondary | ICD-10-CM | POA: Diagnosis not present

## 2016-08-07 DIAGNOSIS — Z713 Dietary counseling and surveillance: Secondary | ICD-10-CM | POA: Diagnosis not present

## 2016-08-26 DIAGNOSIS — Z23 Encounter for immunization: Secondary | ICD-10-CM | POA: Diagnosis not present

## 2016-09-05 DIAGNOSIS — I2699 Other pulmonary embolism without acute cor pulmonale: Secondary | ICD-10-CM | POA: Diagnosis not present

## 2016-09-05 DIAGNOSIS — Z299 Encounter for prophylactic measures, unspecified: Secondary | ICD-10-CM | POA: Diagnosis not present

## 2016-09-05 DIAGNOSIS — Z789 Other specified health status: Secondary | ICD-10-CM | POA: Diagnosis not present

## 2016-09-05 DIAGNOSIS — Z6836 Body mass index (BMI) 36.0-36.9, adult: Secondary | ICD-10-CM | POA: Diagnosis not present

## 2016-09-05 DIAGNOSIS — I10 Essential (primary) hypertension: Secondary | ICD-10-CM | POA: Diagnosis not present

## 2016-09-06 DIAGNOSIS — M159 Polyosteoarthritis, unspecified: Secondary | ICD-10-CM | POA: Diagnosis not present

## 2016-09-06 DIAGNOSIS — I10 Essential (primary) hypertension: Secondary | ICD-10-CM | POA: Diagnosis not present

## 2016-09-12 DIAGNOSIS — Z1231 Encounter for screening mammogram for malignant neoplasm of breast: Secondary | ICD-10-CM | POA: Diagnosis not present

## 2016-09-23 DIAGNOSIS — H26491 Other secondary cataract, right eye: Secondary | ICD-10-CM | POA: Diagnosis not present

## 2016-09-23 DIAGNOSIS — H43813 Vitreous degeneration, bilateral: Secondary | ICD-10-CM | POA: Diagnosis not present

## 2016-09-23 DIAGNOSIS — H02831 Dermatochalasis of right upper eyelid: Secondary | ICD-10-CM | POA: Diagnosis not present

## 2016-09-23 DIAGNOSIS — H02834 Dermatochalasis of left upper eyelid: Secondary | ICD-10-CM | POA: Diagnosis not present

## 2016-09-23 DIAGNOSIS — Z961 Presence of intraocular lens: Secondary | ICD-10-CM | POA: Diagnosis not present

## 2016-10-03 DIAGNOSIS — Z713 Dietary counseling and surveillance: Secondary | ICD-10-CM | POA: Diagnosis not present

## 2016-10-03 DIAGNOSIS — I10 Essential (primary) hypertension: Secondary | ICD-10-CM | POA: Diagnosis not present

## 2016-10-03 DIAGNOSIS — I2699 Other pulmonary embolism without acute cor pulmonale: Secondary | ICD-10-CM | POA: Diagnosis not present

## 2016-10-03 DIAGNOSIS — Z299 Encounter for prophylactic measures, unspecified: Secondary | ICD-10-CM | POA: Diagnosis not present

## 2016-10-03 DIAGNOSIS — Z789 Other specified health status: Secondary | ICD-10-CM | POA: Diagnosis not present

## 2016-10-03 DIAGNOSIS — Z6836 Body mass index (BMI) 36.0-36.9, adult: Secondary | ICD-10-CM | POA: Diagnosis not present

## 2016-10-31 DIAGNOSIS — I2699 Other pulmonary embolism without acute cor pulmonale: Secondary | ICD-10-CM | POA: Diagnosis not present

## 2016-10-31 DIAGNOSIS — Z299 Encounter for prophylactic measures, unspecified: Secondary | ICD-10-CM | POA: Diagnosis not present

## 2016-10-31 DIAGNOSIS — I10 Essential (primary) hypertension: Secondary | ICD-10-CM | POA: Diagnosis not present

## 2016-10-31 DIAGNOSIS — R2681 Unsteadiness on feet: Secondary | ICD-10-CM | POA: Diagnosis not present

## 2016-10-31 DIAGNOSIS — Z6836 Body mass index (BMI) 36.0-36.9, adult: Secondary | ICD-10-CM | POA: Diagnosis not present

## 2016-10-31 DIAGNOSIS — Z713 Dietary counseling and surveillance: Secondary | ICD-10-CM | POA: Diagnosis not present

## 2016-11-28 DIAGNOSIS — I10 Essential (primary) hypertension: Secondary | ICD-10-CM | POA: Diagnosis not present

## 2016-11-28 DIAGNOSIS — K210 Gastro-esophageal reflux disease with esophagitis: Secondary | ICD-10-CM | POA: Diagnosis not present

## 2016-11-28 DIAGNOSIS — Z6836 Body mass index (BMI) 36.0-36.9, adult: Secondary | ICD-10-CM | POA: Diagnosis not present

## 2016-11-28 DIAGNOSIS — K429 Umbilical hernia without obstruction or gangrene: Secondary | ICD-10-CM | POA: Diagnosis not present

## 2016-11-28 DIAGNOSIS — I2699 Other pulmonary embolism without acute cor pulmonale: Secondary | ICD-10-CM | POA: Diagnosis not present

## 2016-11-28 DIAGNOSIS — Z299 Encounter for prophylactic measures, unspecified: Secondary | ICD-10-CM | POA: Diagnosis not present

## 2016-12-09 DIAGNOSIS — M159 Polyosteoarthritis, unspecified: Secondary | ICD-10-CM | POA: Diagnosis not present

## 2016-12-09 DIAGNOSIS — I10 Essential (primary) hypertension: Secondary | ICD-10-CM | POA: Diagnosis not present

## 2016-12-26 DIAGNOSIS — I10 Essential (primary) hypertension: Secondary | ICD-10-CM | POA: Diagnosis not present

## 2016-12-26 DIAGNOSIS — Z6836 Body mass index (BMI) 36.0-36.9, adult: Secondary | ICD-10-CM | POA: Diagnosis not present

## 2016-12-26 DIAGNOSIS — Z299 Encounter for prophylactic measures, unspecified: Secondary | ICD-10-CM | POA: Diagnosis not present

## 2016-12-26 DIAGNOSIS — E7800 Pure hypercholesterolemia, unspecified: Secondary | ICD-10-CM | POA: Diagnosis not present

## 2016-12-26 DIAGNOSIS — I2699 Other pulmonary embolism without acute cor pulmonale: Secondary | ICD-10-CM | POA: Diagnosis not present

## 2016-12-26 DIAGNOSIS — K210 Gastro-esophageal reflux disease with esophagitis: Secondary | ICD-10-CM | POA: Diagnosis not present

## 2017-01-08 DIAGNOSIS — I10 Essential (primary) hypertension: Secondary | ICD-10-CM | POA: Diagnosis not present

## 2017-01-08 DIAGNOSIS — M159 Polyosteoarthritis, unspecified: Secondary | ICD-10-CM | POA: Diagnosis not present

## 2017-01-23 DIAGNOSIS — I2699 Other pulmonary embolism without acute cor pulmonale: Secondary | ICD-10-CM | POA: Diagnosis not present

## 2017-01-23 DIAGNOSIS — I10 Essential (primary) hypertension: Secondary | ICD-10-CM | POA: Diagnosis not present

## 2017-01-23 DIAGNOSIS — E669 Obesity, unspecified: Secondary | ICD-10-CM | POA: Diagnosis not present

## 2017-01-23 DIAGNOSIS — Z6836 Body mass index (BMI) 36.0-36.9, adult: Secondary | ICD-10-CM | POA: Diagnosis not present

## 2017-01-23 DIAGNOSIS — Z299 Encounter for prophylactic measures, unspecified: Secondary | ICD-10-CM | POA: Diagnosis not present

## 2017-01-23 DIAGNOSIS — E7800 Pure hypercholesterolemia, unspecified: Secondary | ICD-10-CM | POA: Diagnosis not present

## 2017-02-20 DIAGNOSIS — Z299 Encounter for prophylactic measures, unspecified: Secondary | ICD-10-CM | POA: Diagnosis not present

## 2017-02-20 DIAGNOSIS — I10 Essential (primary) hypertension: Secondary | ICD-10-CM | POA: Diagnosis not present

## 2017-02-20 DIAGNOSIS — I2699 Other pulmonary embolism without acute cor pulmonale: Secondary | ICD-10-CM | POA: Diagnosis not present

## 2017-02-20 DIAGNOSIS — E669 Obesity, unspecified: Secondary | ICD-10-CM | POA: Diagnosis not present

## 2017-02-20 DIAGNOSIS — E7800 Pure hypercholesterolemia, unspecified: Secondary | ICD-10-CM | POA: Diagnosis not present

## 2017-02-20 DIAGNOSIS — Z6836 Body mass index (BMI) 36.0-36.9, adult: Secondary | ICD-10-CM | POA: Diagnosis not present

## 2017-03-09 DIAGNOSIS — S0181XA Laceration without foreign body of other part of head, initial encounter: Secondary | ICD-10-CM | POA: Diagnosis not present

## 2017-03-09 DIAGNOSIS — I10 Essential (primary) hypertension: Secondary | ICD-10-CM | POA: Diagnosis not present

## 2017-03-09 DIAGNOSIS — M1990 Unspecified osteoarthritis, unspecified site: Secondary | ICD-10-CM | POA: Diagnosis not present

## 2017-03-09 DIAGNOSIS — Z7901 Long term (current) use of anticoagulants: Secondary | ICD-10-CM | POA: Diagnosis not present

## 2017-03-09 DIAGNOSIS — Z86711 Personal history of pulmonary embolism: Secondary | ICD-10-CM | POA: Diagnosis not present

## 2017-03-09 DIAGNOSIS — Z79899 Other long term (current) drug therapy: Secondary | ICD-10-CM | POA: Diagnosis not present

## 2017-03-09 DIAGNOSIS — S098XXA Other specified injuries of head, initial encounter: Secondary | ICD-10-CM | POA: Diagnosis not present

## 2017-03-09 DIAGNOSIS — S0990XA Unspecified injury of head, initial encounter: Secondary | ICD-10-CM | POA: Diagnosis not present

## 2017-03-09 DIAGNOSIS — S01111A Laceration without foreign body of right eyelid and periocular area, initial encounter: Secondary | ICD-10-CM | POA: Diagnosis not present

## 2017-03-09 DIAGNOSIS — K219 Gastro-esophageal reflux disease without esophagitis: Secondary | ICD-10-CM | POA: Diagnosis not present

## 2017-03-09 DIAGNOSIS — R51 Headache: Secondary | ICD-10-CM | POA: Diagnosis not present

## 2017-03-09 DIAGNOSIS — W0110XA Fall on same level from slipping, tripping and stumbling with subsequent striking against unspecified object, initial encounter: Secondary | ICD-10-CM | POA: Diagnosis not present

## 2017-03-09 DIAGNOSIS — S199XXA Unspecified injury of neck, initial encounter: Secondary | ICD-10-CM | POA: Diagnosis not present

## 2017-03-20 DIAGNOSIS — I10 Essential (primary) hypertension: Secondary | ICD-10-CM | POA: Diagnosis not present

## 2017-03-20 DIAGNOSIS — K210 Gastro-esophageal reflux disease with esophagitis: Secondary | ICD-10-CM | POA: Diagnosis not present

## 2017-03-20 DIAGNOSIS — Z299 Encounter for prophylactic measures, unspecified: Secondary | ICD-10-CM | POA: Diagnosis not present

## 2017-03-20 DIAGNOSIS — Z6836 Body mass index (BMI) 36.0-36.9, adult: Secondary | ICD-10-CM | POA: Diagnosis not present

## 2017-03-20 DIAGNOSIS — I2699 Other pulmonary embolism without acute cor pulmonale: Secondary | ICD-10-CM | POA: Diagnosis not present

## 2017-04-17 DIAGNOSIS — I2699 Other pulmonary embolism without acute cor pulmonale: Secondary | ICD-10-CM | POA: Diagnosis not present

## 2017-04-17 DIAGNOSIS — Z6836 Body mass index (BMI) 36.0-36.9, adult: Secondary | ICD-10-CM | POA: Diagnosis not present

## 2017-04-17 DIAGNOSIS — E7800 Pure hypercholesterolemia, unspecified: Secondary | ICD-10-CM | POA: Diagnosis not present

## 2017-04-17 DIAGNOSIS — I8310 Varicose veins of unspecified lower extremity with inflammation: Secondary | ICD-10-CM | POA: Diagnosis not present

## 2017-04-17 DIAGNOSIS — I10 Essential (primary) hypertension: Secondary | ICD-10-CM | POA: Diagnosis not present

## 2017-04-17 DIAGNOSIS — Z299 Encounter for prophylactic measures, unspecified: Secondary | ICD-10-CM | POA: Diagnosis not present

## 2017-05-15 DIAGNOSIS — R5383 Other fatigue: Secondary | ICD-10-CM | POA: Diagnosis not present

## 2017-05-15 DIAGNOSIS — I10 Essential (primary) hypertension: Secondary | ICD-10-CM | POA: Diagnosis not present

## 2017-05-15 DIAGNOSIS — E559 Vitamin D deficiency, unspecified: Secondary | ICD-10-CM | POA: Diagnosis not present

## 2017-05-15 DIAGNOSIS — Z299 Encounter for prophylactic measures, unspecified: Secondary | ICD-10-CM | POA: Diagnosis not present

## 2017-05-15 DIAGNOSIS — Z1331 Encounter for screening for depression: Secondary | ICD-10-CM | POA: Diagnosis not present

## 2017-05-15 DIAGNOSIS — E7800 Pure hypercholesterolemia, unspecified: Secondary | ICD-10-CM | POA: Diagnosis not present

## 2017-05-15 DIAGNOSIS — I2699 Other pulmonary embolism without acute cor pulmonale: Secondary | ICD-10-CM | POA: Diagnosis not present

## 2017-05-15 DIAGNOSIS — Z0000 Encounter for general adult medical examination without abnormal findings: Secondary | ICD-10-CM | POA: Diagnosis not present

## 2017-05-15 DIAGNOSIS — Z6836 Body mass index (BMI) 36.0-36.9, adult: Secondary | ICD-10-CM | POA: Diagnosis not present

## 2017-05-15 DIAGNOSIS — Z1339 Encounter for screening examination for other mental health and behavioral disorders: Secondary | ICD-10-CM | POA: Diagnosis not present

## 2017-05-15 DIAGNOSIS — Z79899 Other long term (current) drug therapy: Secondary | ICD-10-CM | POA: Diagnosis not present

## 2017-05-15 DIAGNOSIS — Z23 Encounter for immunization: Secondary | ICD-10-CM | POA: Diagnosis not present

## 2017-05-15 DIAGNOSIS — Z7189 Other specified counseling: Secondary | ICD-10-CM | POA: Diagnosis not present

## 2017-05-20 DIAGNOSIS — I10 Essential (primary) hypertension: Secondary | ICD-10-CM | POA: Diagnosis not present

## 2017-05-20 DIAGNOSIS — M159 Polyosteoarthritis, unspecified: Secondary | ICD-10-CM | POA: Diagnosis not present

## 2017-06-06 DIAGNOSIS — E7800 Pure hypercholesterolemia, unspecified: Secondary | ICD-10-CM | POA: Diagnosis not present

## 2017-06-06 DIAGNOSIS — C44319 Basal cell carcinoma of skin of other parts of face: Secondary | ICD-10-CM | POA: Diagnosis not present

## 2017-06-06 DIAGNOSIS — Z299 Encounter for prophylactic measures, unspecified: Secondary | ICD-10-CM | POA: Diagnosis not present

## 2017-06-06 DIAGNOSIS — I2699 Other pulmonary embolism without acute cor pulmonale: Secondary | ICD-10-CM | POA: Diagnosis not present

## 2017-06-06 DIAGNOSIS — I10 Essential (primary) hypertension: Secondary | ICD-10-CM | POA: Diagnosis not present

## 2017-06-06 DIAGNOSIS — L989 Disorder of the skin and subcutaneous tissue, unspecified: Secondary | ICD-10-CM | POA: Diagnosis not present

## 2017-06-06 DIAGNOSIS — Z6836 Body mass index (BMI) 36.0-36.9, adult: Secondary | ICD-10-CM | POA: Diagnosis not present

## 2017-06-06 DIAGNOSIS — R2681 Unsteadiness on feet: Secondary | ICD-10-CM | POA: Diagnosis not present

## 2017-06-19 DIAGNOSIS — I10 Essential (primary) hypertension: Secondary | ICD-10-CM | POA: Diagnosis not present

## 2017-06-19 DIAGNOSIS — M159 Polyosteoarthritis, unspecified: Secondary | ICD-10-CM | POA: Diagnosis not present

## 2017-07-07 DIAGNOSIS — Z6836 Body mass index (BMI) 36.0-36.9, adult: Secondary | ICD-10-CM | POA: Diagnosis not present

## 2017-07-07 DIAGNOSIS — I10 Essential (primary) hypertension: Secondary | ICD-10-CM | POA: Diagnosis not present

## 2017-07-07 DIAGNOSIS — Z713 Dietary counseling and surveillance: Secondary | ICD-10-CM | POA: Diagnosis not present

## 2017-07-07 DIAGNOSIS — I2699 Other pulmonary embolism without acute cor pulmonale: Secondary | ICD-10-CM | POA: Diagnosis not present

## 2017-07-07 DIAGNOSIS — Z299 Encounter for prophylactic measures, unspecified: Secondary | ICD-10-CM | POA: Diagnosis not present

## 2017-07-15 DIAGNOSIS — M159 Polyosteoarthritis, unspecified: Secondary | ICD-10-CM | POA: Diagnosis not present

## 2017-07-15 DIAGNOSIS — I10 Essential (primary) hypertension: Secondary | ICD-10-CM | POA: Diagnosis not present

## 2017-07-22 DIAGNOSIS — Z299 Encounter for prophylactic measures, unspecified: Secondary | ICD-10-CM | POA: Diagnosis not present

## 2017-07-22 DIAGNOSIS — I2699 Other pulmonary embolism without acute cor pulmonale: Secondary | ICD-10-CM | POA: Diagnosis not present

## 2017-07-22 DIAGNOSIS — Z6836 Body mass index (BMI) 36.0-36.9, adult: Secondary | ICD-10-CM | POA: Diagnosis not present

## 2017-07-22 DIAGNOSIS — I10 Essential (primary) hypertension: Secondary | ICD-10-CM | POA: Diagnosis not present

## 2017-08-14 DIAGNOSIS — C44319 Basal cell carcinoma of skin of other parts of face: Secondary | ICD-10-CM | POA: Diagnosis not present

## 2017-08-19 DIAGNOSIS — Z299 Encounter for prophylactic measures, unspecified: Secondary | ICD-10-CM | POA: Diagnosis not present

## 2017-08-19 DIAGNOSIS — Z6836 Body mass index (BMI) 36.0-36.9, adult: Secondary | ICD-10-CM | POA: Diagnosis not present

## 2017-08-19 DIAGNOSIS — I10 Essential (primary) hypertension: Secondary | ICD-10-CM | POA: Diagnosis not present

## 2017-08-19 DIAGNOSIS — Z789 Other specified health status: Secondary | ICD-10-CM | POA: Diagnosis not present

## 2017-08-19 DIAGNOSIS — I2699 Other pulmonary embolism without acute cor pulmonale: Secondary | ICD-10-CM | POA: Diagnosis not present

## 2017-08-20 DIAGNOSIS — Y92009 Unspecified place in unspecified non-institutional (private) residence as the place of occurrence of the external cause: Secondary | ICD-10-CM | POA: Diagnosis not present

## 2017-08-20 DIAGNOSIS — Z880 Allergy status to penicillin: Secondary | ICD-10-CM | POA: Diagnosis not present

## 2017-08-20 DIAGNOSIS — M6281 Muscle weakness (generalized): Secondary | ICD-10-CM | POA: Diagnosis not present

## 2017-08-20 DIAGNOSIS — Z7901 Long term (current) use of anticoagulants: Secondary | ICD-10-CM | POA: Diagnosis not present

## 2017-08-20 DIAGNOSIS — K219 Gastro-esophageal reflux disease without esophagitis: Secondary | ICD-10-CM | POA: Diagnosis present

## 2017-08-20 DIAGNOSIS — S42302D Unspecified fracture of shaft of humerus, left arm, subsequent encounter for fracture with routine healing: Secondary | ICD-10-CM | POA: Diagnosis not present

## 2017-08-20 DIAGNOSIS — M1990 Unspecified osteoarthritis, unspecified site: Secondary | ICD-10-CM | POA: Diagnosis not present

## 2017-08-20 DIAGNOSIS — S42402A Unspecified fracture of lower end of left humerus, initial encounter for closed fracture: Secondary | ICD-10-CM | POA: Diagnosis not present

## 2017-08-20 DIAGNOSIS — W19XXXA Unspecified fall, initial encounter: Secondary | ICD-10-CM | POA: Diagnosis not present

## 2017-08-20 DIAGNOSIS — I10 Essential (primary) hypertension: Secondary | ICD-10-CM | POA: Diagnosis not present

## 2017-08-20 DIAGNOSIS — Z881 Allergy status to other antibiotic agents status: Secondary | ICD-10-CM | POA: Diagnosis not present

## 2017-08-20 DIAGNOSIS — Z79899 Other long term (current) drug therapy: Secondary | ICD-10-CM | POA: Diagnosis not present

## 2017-08-20 DIAGNOSIS — Z9181 History of falling: Secondary | ICD-10-CM | POA: Diagnosis not present

## 2017-08-20 DIAGNOSIS — R0789 Other chest pain: Secondary | ICD-10-CM | POA: Diagnosis not present

## 2017-08-20 DIAGNOSIS — S299XXA Unspecified injury of thorax, initial encounter: Secondary | ICD-10-CM | POA: Diagnosis not present

## 2017-08-20 DIAGNOSIS — W1839XA Other fall on same level, initial encounter: Secondary | ICD-10-CM | POA: Diagnosis not present

## 2017-08-20 DIAGNOSIS — S42472A Displaced transcondylar fracture of left humerus, initial encounter for closed fracture: Secondary | ICD-10-CM | POA: Diagnosis present

## 2017-08-20 DIAGNOSIS — S42413A Displaced simple supracondylar fracture without intercondylar fracture of unspecified humerus, initial encounter for closed fracture: Secondary | ICD-10-CM | POA: Diagnosis not present

## 2017-08-20 DIAGNOSIS — W19XXXD Unspecified fall, subsequent encounter: Secondary | ICD-10-CM | POA: Diagnosis not present

## 2017-08-20 DIAGNOSIS — S2242XA Multiple fractures of ribs, left side, initial encounter for closed fracture: Secondary | ICD-10-CM | POA: Diagnosis not present

## 2017-08-20 DIAGNOSIS — S42412A Displaced simple supracondylar fracture without intercondylar fracture of left humerus, initial encounter for closed fracture: Secondary | ICD-10-CM | POA: Diagnosis not present

## 2017-08-20 DIAGNOSIS — Z86711 Personal history of pulmonary embolism: Secondary | ICD-10-CM | POA: Diagnosis not present

## 2017-08-26 DIAGNOSIS — Z9181 History of falling: Secondary | ICD-10-CM | POA: Diagnosis not present

## 2017-08-26 DIAGNOSIS — K219 Gastro-esophageal reflux disease without esophagitis: Secondary | ICD-10-CM | POA: Diagnosis not present

## 2017-08-26 DIAGNOSIS — M1990 Unspecified osteoarthritis, unspecified site: Secondary | ICD-10-CM | POA: Diagnosis not present

## 2017-08-26 DIAGNOSIS — I10 Essential (primary) hypertension: Secondary | ICD-10-CM | POA: Diagnosis not present

## 2017-08-26 DIAGNOSIS — Z86711 Personal history of pulmonary embolism: Secondary | ICD-10-CM | POA: Diagnosis not present

## 2017-08-26 DIAGNOSIS — Z881 Allergy status to other antibiotic agents status: Secondary | ICD-10-CM | POA: Diagnosis not present

## 2017-08-26 DIAGNOSIS — Y92009 Unspecified place in unspecified non-institutional (private) residence as the place of occurrence of the external cause: Secondary | ICD-10-CM | POA: Diagnosis not present

## 2017-08-26 DIAGNOSIS — W19XXXD Unspecified fall, subsequent encounter: Secondary | ICD-10-CM | POA: Diagnosis not present

## 2017-08-26 DIAGNOSIS — Z880 Allergy status to penicillin: Secondary | ICD-10-CM | POA: Diagnosis not present

## 2017-08-26 DIAGNOSIS — M6281 Muscle weakness (generalized): Secondary | ICD-10-CM | POA: Diagnosis not present

## 2017-08-26 DIAGNOSIS — S2242XA Multiple fractures of ribs, left side, initial encounter for closed fracture: Secondary | ICD-10-CM | POA: Diagnosis not present

## 2017-08-26 DIAGNOSIS — W19XXXA Unspecified fall, initial encounter: Secondary | ICD-10-CM | POA: Diagnosis not present

## 2017-08-26 DIAGNOSIS — S42302A Unspecified fracture of shaft of humerus, left arm, initial encounter for closed fracture: Secondary | ICD-10-CM | POA: Diagnosis not present

## 2017-08-26 DIAGNOSIS — S42472A Displaced transcondylar fracture of left humerus, initial encounter for closed fracture: Secondary | ICD-10-CM | POA: Diagnosis not present

## 2017-08-26 DIAGNOSIS — S42202A Unspecified fracture of upper end of left humerus, initial encounter for closed fracture: Secondary | ICD-10-CM | POA: Diagnosis not present

## 2017-08-26 DIAGNOSIS — S42302D Unspecified fracture of shaft of humerus, left arm, subsequent encounter for fracture with routine healing: Secondary | ICD-10-CM | POA: Diagnosis not present

## 2017-08-26 DIAGNOSIS — S42492D Other displaced fracture of lower end of left humerus, subsequent encounter for fracture with routine healing: Secondary | ICD-10-CM | POA: Diagnosis not present

## 2017-08-27 DIAGNOSIS — S42202A Unspecified fracture of upper end of left humerus, initial encounter for closed fracture: Secondary | ICD-10-CM | POA: Diagnosis not present

## 2017-10-01 DIAGNOSIS — S42492D Other displaced fracture of lower end of left humerus, subsequent encounter for fracture with routine healing: Secondary | ICD-10-CM | POA: Diagnosis not present

## 2017-11-04 DIAGNOSIS — S42492D Other displaced fracture of lower end of left humerus, subsequent encounter for fracture with routine healing: Secondary | ICD-10-CM | POA: Diagnosis not present

## 2017-11-13 DIAGNOSIS — S42302A Unspecified fracture of shaft of humerus, left arm, initial encounter for closed fracture: Secondary | ICD-10-CM | POA: Diagnosis not present

## 2017-11-15 DIAGNOSIS — R262 Difficulty in walking, not elsewhere classified: Secondary | ICD-10-CM | POA: Diagnosis not present

## 2017-11-15 DIAGNOSIS — I10 Essential (primary) hypertension: Secondary | ICD-10-CM | POA: Diagnosis not present

## 2017-11-15 DIAGNOSIS — Z9181 History of falling: Secondary | ICD-10-CM | POA: Diagnosis not present

## 2017-11-15 DIAGNOSIS — M6281 Muscle weakness (generalized): Secondary | ICD-10-CM | POA: Diagnosis not present

## 2017-11-15 DIAGNOSIS — Z86711 Personal history of pulmonary embolism: Secondary | ICD-10-CM | POA: Diagnosis not present

## 2017-11-15 DIAGNOSIS — S42302D Unspecified fracture of shaft of humerus, left arm, subsequent encounter for fracture with routine healing: Secondary | ICD-10-CM | POA: Diagnosis not present

## 2017-11-17 DIAGNOSIS — Z9181 History of falling: Secondary | ICD-10-CM | POA: Diagnosis not present

## 2017-11-17 DIAGNOSIS — I10 Essential (primary) hypertension: Secondary | ICD-10-CM | POA: Diagnosis not present

## 2017-11-17 DIAGNOSIS — R262 Difficulty in walking, not elsewhere classified: Secondary | ICD-10-CM | POA: Diagnosis not present

## 2017-11-17 DIAGNOSIS — Z86711 Personal history of pulmonary embolism: Secondary | ICD-10-CM | POA: Diagnosis not present

## 2017-11-17 DIAGNOSIS — S42302D Unspecified fracture of shaft of humerus, left arm, subsequent encounter for fracture with routine healing: Secondary | ICD-10-CM | POA: Diagnosis not present

## 2017-11-17 DIAGNOSIS — M6281 Muscle weakness (generalized): Secondary | ICD-10-CM | POA: Diagnosis not present

## 2017-11-18 DIAGNOSIS — I10 Essential (primary) hypertension: Secondary | ICD-10-CM | POA: Diagnosis not present

## 2017-11-18 DIAGNOSIS — R262 Difficulty in walking, not elsewhere classified: Secondary | ICD-10-CM | POA: Diagnosis not present

## 2017-11-18 DIAGNOSIS — M6281 Muscle weakness (generalized): Secondary | ICD-10-CM | POA: Diagnosis not present

## 2017-11-18 DIAGNOSIS — S42302D Unspecified fracture of shaft of humerus, left arm, subsequent encounter for fracture with routine healing: Secondary | ICD-10-CM | POA: Diagnosis not present

## 2017-11-18 DIAGNOSIS — Z9181 History of falling: Secondary | ICD-10-CM | POA: Diagnosis not present

## 2017-11-18 DIAGNOSIS — Z86711 Personal history of pulmonary embolism: Secondary | ICD-10-CM | POA: Diagnosis not present

## 2017-11-19 DIAGNOSIS — Z9181 History of falling: Secondary | ICD-10-CM | POA: Diagnosis not present

## 2017-11-19 DIAGNOSIS — R262 Difficulty in walking, not elsewhere classified: Secondary | ICD-10-CM | POA: Diagnosis not present

## 2017-11-19 DIAGNOSIS — I10 Essential (primary) hypertension: Secondary | ICD-10-CM | POA: Diagnosis not present

## 2017-11-19 DIAGNOSIS — Z86711 Personal history of pulmonary embolism: Secondary | ICD-10-CM | POA: Diagnosis not present

## 2017-11-19 DIAGNOSIS — S42302D Unspecified fracture of shaft of humerus, left arm, subsequent encounter for fracture with routine healing: Secondary | ICD-10-CM | POA: Diagnosis not present

## 2017-11-19 DIAGNOSIS — M6281 Muscle weakness (generalized): Secondary | ICD-10-CM | POA: Diagnosis not present

## 2017-11-20 DIAGNOSIS — I2699 Other pulmonary embolism without acute cor pulmonale: Secondary | ICD-10-CM | POA: Diagnosis not present

## 2017-11-20 DIAGNOSIS — Z299 Encounter for prophylactic measures, unspecified: Secondary | ICD-10-CM | POA: Diagnosis not present

## 2017-11-20 DIAGNOSIS — Z6836 Body mass index (BMI) 36.0-36.9, adult: Secondary | ICD-10-CM | POA: Diagnosis not present

## 2017-11-20 DIAGNOSIS — I10 Essential (primary) hypertension: Secondary | ICD-10-CM | POA: Diagnosis not present

## 2017-11-20 DIAGNOSIS — Z713 Dietary counseling and surveillance: Secondary | ICD-10-CM | POA: Diagnosis not present

## 2017-11-21 DIAGNOSIS — Z86711 Personal history of pulmonary embolism: Secondary | ICD-10-CM | POA: Diagnosis not present

## 2017-11-21 DIAGNOSIS — I10 Essential (primary) hypertension: Secondary | ICD-10-CM | POA: Diagnosis not present

## 2017-11-21 DIAGNOSIS — Z9181 History of falling: Secondary | ICD-10-CM | POA: Diagnosis not present

## 2017-11-21 DIAGNOSIS — R262 Difficulty in walking, not elsewhere classified: Secondary | ICD-10-CM | POA: Diagnosis not present

## 2017-11-21 DIAGNOSIS — S42302D Unspecified fracture of shaft of humerus, left arm, subsequent encounter for fracture with routine healing: Secondary | ICD-10-CM | POA: Diagnosis not present

## 2017-11-21 DIAGNOSIS — M6281 Muscle weakness (generalized): Secondary | ICD-10-CM | POA: Diagnosis not present

## 2017-11-24 DIAGNOSIS — R262 Difficulty in walking, not elsewhere classified: Secondary | ICD-10-CM | POA: Diagnosis not present

## 2017-11-24 DIAGNOSIS — Z9181 History of falling: Secondary | ICD-10-CM | POA: Diagnosis not present

## 2017-11-24 DIAGNOSIS — Z86711 Personal history of pulmonary embolism: Secondary | ICD-10-CM | POA: Diagnosis not present

## 2017-11-24 DIAGNOSIS — M6281 Muscle weakness (generalized): Secondary | ICD-10-CM | POA: Diagnosis not present

## 2017-11-24 DIAGNOSIS — S42302D Unspecified fracture of shaft of humerus, left arm, subsequent encounter for fracture with routine healing: Secondary | ICD-10-CM | POA: Diagnosis not present

## 2017-11-24 DIAGNOSIS — I10 Essential (primary) hypertension: Secondary | ICD-10-CM | POA: Diagnosis not present

## 2017-11-26 DIAGNOSIS — S42302D Unspecified fracture of shaft of humerus, left arm, subsequent encounter for fracture with routine healing: Secondary | ICD-10-CM | POA: Diagnosis not present

## 2017-11-26 DIAGNOSIS — M6281 Muscle weakness (generalized): Secondary | ICD-10-CM | POA: Diagnosis not present

## 2017-11-26 DIAGNOSIS — I10 Essential (primary) hypertension: Secondary | ICD-10-CM | POA: Diagnosis not present

## 2017-11-26 DIAGNOSIS — Z9181 History of falling: Secondary | ICD-10-CM | POA: Diagnosis not present

## 2017-11-26 DIAGNOSIS — Z86711 Personal history of pulmonary embolism: Secondary | ICD-10-CM | POA: Diagnosis not present

## 2017-11-26 DIAGNOSIS — R262 Difficulty in walking, not elsewhere classified: Secondary | ICD-10-CM | POA: Diagnosis not present

## 2017-11-27 DIAGNOSIS — S42302D Unspecified fracture of shaft of humerus, left arm, subsequent encounter for fracture with routine healing: Secondary | ICD-10-CM | POA: Diagnosis not present

## 2017-11-27 DIAGNOSIS — M6281 Muscle weakness (generalized): Secondary | ICD-10-CM | POA: Diagnosis not present

## 2017-11-27 DIAGNOSIS — R262 Difficulty in walking, not elsewhere classified: Secondary | ICD-10-CM | POA: Diagnosis not present

## 2017-11-27 DIAGNOSIS — M159 Polyosteoarthritis, unspecified: Secondary | ICD-10-CM | POA: Diagnosis not present

## 2017-11-27 DIAGNOSIS — Z86711 Personal history of pulmonary embolism: Secondary | ICD-10-CM | POA: Diagnosis not present

## 2017-11-27 DIAGNOSIS — Z9181 History of falling: Secondary | ICD-10-CM | POA: Diagnosis not present

## 2017-11-27 DIAGNOSIS — I10 Essential (primary) hypertension: Secondary | ICD-10-CM | POA: Diagnosis not present

## 2017-11-28 DIAGNOSIS — M6281 Muscle weakness (generalized): Secondary | ICD-10-CM | POA: Diagnosis not present

## 2017-11-28 DIAGNOSIS — R262 Difficulty in walking, not elsewhere classified: Secondary | ICD-10-CM | POA: Diagnosis not present

## 2017-11-28 DIAGNOSIS — Z86711 Personal history of pulmonary embolism: Secondary | ICD-10-CM | POA: Diagnosis not present

## 2017-11-28 DIAGNOSIS — Z9181 History of falling: Secondary | ICD-10-CM | POA: Diagnosis not present

## 2017-11-28 DIAGNOSIS — S42302D Unspecified fracture of shaft of humerus, left arm, subsequent encounter for fracture with routine healing: Secondary | ICD-10-CM | POA: Diagnosis not present

## 2017-11-28 DIAGNOSIS — I10 Essential (primary) hypertension: Secondary | ICD-10-CM | POA: Diagnosis not present

## 2017-12-02 DIAGNOSIS — S42302D Unspecified fracture of shaft of humerus, left arm, subsequent encounter for fracture with routine healing: Secondary | ICD-10-CM | POA: Diagnosis not present

## 2017-12-02 DIAGNOSIS — Z9181 History of falling: Secondary | ICD-10-CM | POA: Diagnosis not present

## 2017-12-02 DIAGNOSIS — Z86711 Personal history of pulmonary embolism: Secondary | ICD-10-CM | POA: Diagnosis not present

## 2017-12-02 DIAGNOSIS — I10 Essential (primary) hypertension: Secondary | ICD-10-CM | POA: Diagnosis not present

## 2017-12-02 DIAGNOSIS — R262 Difficulty in walking, not elsewhere classified: Secondary | ICD-10-CM | POA: Diagnosis not present

## 2017-12-02 DIAGNOSIS — M6281 Muscle weakness (generalized): Secondary | ICD-10-CM | POA: Diagnosis not present

## 2017-12-03 DIAGNOSIS — I10 Essential (primary) hypertension: Secondary | ICD-10-CM | POA: Diagnosis not present

## 2017-12-03 DIAGNOSIS — S42302D Unspecified fracture of shaft of humerus, left arm, subsequent encounter for fracture with routine healing: Secondary | ICD-10-CM | POA: Diagnosis not present

## 2017-12-03 DIAGNOSIS — R262 Difficulty in walking, not elsewhere classified: Secondary | ICD-10-CM | POA: Diagnosis not present

## 2017-12-03 DIAGNOSIS — Z9181 History of falling: Secondary | ICD-10-CM | POA: Diagnosis not present

## 2017-12-03 DIAGNOSIS — Z86711 Personal history of pulmonary embolism: Secondary | ICD-10-CM | POA: Diagnosis not present

## 2017-12-03 DIAGNOSIS — M6281 Muscle weakness (generalized): Secondary | ICD-10-CM | POA: Diagnosis not present

## 2017-12-04 DIAGNOSIS — M6281 Muscle weakness (generalized): Secondary | ICD-10-CM | POA: Diagnosis not present

## 2017-12-04 DIAGNOSIS — I10 Essential (primary) hypertension: Secondary | ICD-10-CM | POA: Diagnosis not present

## 2017-12-04 DIAGNOSIS — S42302D Unspecified fracture of shaft of humerus, left arm, subsequent encounter for fracture with routine healing: Secondary | ICD-10-CM | POA: Diagnosis not present

## 2017-12-04 DIAGNOSIS — Z9181 History of falling: Secondary | ICD-10-CM | POA: Diagnosis not present

## 2017-12-04 DIAGNOSIS — Z86711 Personal history of pulmonary embolism: Secondary | ICD-10-CM | POA: Diagnosis not present

## 2017-12-04 DIAGNOSIS — R262 Difficulty in walking, not elsewhere classified: Secondary | ICD-10-CM | POA: Diagnosis not present

## 2017-12-05 DIAGNOSIS — R262 Difficulty in walking, not elsewhere classified: Secondary | ICD-10-CM | POA: Diagnosis not present

## 2017-12-05 DIAGNOSIS — M6281 Muscle weakness (generalized): Secondary | ICD-10-CM | POA: Diagnosis not present

## 2017-12-05 DIAGNOSIS — S42302D Unspecified fracture of shaft of humerus, left arm, subsequent encounter for fracture with routine healing: Secondary | ICD-10-CM | POA: Diagnosis not present

## 2017-12-05 DIAGNOSIS — Z86711 Personal history of pulmonary embolism: Secondary | ICD-10-CM | POA: Diagnosis not present

## 2017-12-05 DIAGNOSIS — Z9181 History of falling: Secondary | ICD-10-CM | POA: Diagnosis not present

## 2017-12-05 DIAGNOSIS — I10 Essential (primary) hypertension: Secondary | ICD-10-CM | POA: Diagnosis not present

## 2017-12-09 DIAGNOSIS — Z9181 History of falling: Secondary | ICD-10-CM | POA: Diagnosis not present

## 2017-12-09 DIAGNOSIS — S42302D Unspecified fracture of shaft of humerus, left arm, subsequent encounter for fracture with routine healing: Secondary | ICD-10-CM | POA: Diagnosis not present

## 2017-12-09 DIAGNOSIS — R262 Difficulty in walking, not elsewhere classified: Secondary | ICD-10-CM | POA: Diagnosis not present

## 2017-12-09 DIAGNOSIS — I10 Essential (primary) hypertension: Secondary | ICD-10-CM | POA: Diagnosis not present

## 2017-12-09 DIAGNOSIS — M6281 Muscle weakness (generalized): Secondary | ICD-10-CM | POA: Diagnosis not present

## 2017-12-09 DIAGNOSIS — Z86711 Personal history of pulmonary embolism: Secondary | ICD-10-CM | POA: Diagnosis not present

## 2017-12-11 DIAGNOSIS — M6281 Muscle weakness (generalized): Secondary | ICD-10-CM | POA: Diagnosis not present

## 2017-12-11 DIAGNOSIS — S42302D Unspecified fracture of shaft of humerus, left arm, subsequent encounter for fracture with routine healing: Secondary | ICD-10-CM | POA: Diagnosis not present

## 2017-12-11 DIAGNOSIS — R262 Difficulty in walking, not elsewhere classified: Secondary | ICD-10-CM | POA: Diagnosis not present

## 2017-12-11 DIAGNOSIS — Z86711 Personal history of pulmonary embolism: Secondary | ICD-10-CM | POA: Diagnosis not present

## 2017-12-11 DIAGNOSIS — Z9181 History of falling: Secondary | ICD-10-CM | POA: Diagnosis not present

## 2017-12-11 DIAGNOSIS — I10 Essential (primary) hypertension: Secondary | ICD-10-CM | POA: Diagnosis not present

## 2017-12-17 DIAGNOSIS — Z86711 Personal history of pulmonary embolism: Secondary | ICD-10-CM | POA: Diagnosis not present

## 2017-12-17 DIAGNOSIS — R262 Difficulty in walking, not elsewhere classified: Secondary | ICD-10-CM | POA: Diagnosis not present

## 2017-12-17 DIAGNOSIS — Z9181 History of falling: Secondary | ICD-10-CM | POA: Diagnosis not present

## 2017-12-17 DIAGNOSIS — S42302D Unspecified fracture of shaft of humerus, left arm, subsequent encounter for fracture with routine healing: Secondary | ICD-10-CM | POA: Diagnosis not present

## 2017-12-17 DIAGNOSIS — I10 Essential (primary) hypertension: Secondary | ICD-10-CM | POA: Diagnosis not present

## 2017-12-17 DIAGNOSIS — M6281 Muscle weakness (generalized): Secondary | ICD-10-CM | POA: Diagnosis not present

## 2017-12-18 DIAGNOSIS — Z299 Encounter for prophylactic measures, unspecified: Secondary | ICD-10-CM | POA: Diagnosis not present

## 2017-12-18 DIAGNOSIS — Z713 Dietary counseling and surveillance: Secondary | ICD-10-CM | POA: Diagnosis not present

## 2017-12-18 DIAGNOSIS — I2699 Other pulmonary embolism without acute cor pulmonale: Secondary | ICD-10-CM | POA: Diagnosis not present

## 2017-12-18 DIAGNOSIS — I10 Essential (primary) hypertension: Secondary | ICD-10-CM | POA: Diagnosis not present

## 2017-12-18 DIAGNOSIS — Z6836 Body mass index (BMI) 36.0-36.9, adult: Secondary | ICD-10-CM | POA: Diagnosis not present

## 2017-12-24 DIAGNOSIS — H43813 Vitreous degeneration, bilateral: Secondary | ICD-10-CM | POA: Diagnosis not present

## 2017-12-24 DIAGNOSIS — H02834 Dermatochalasis of left upper eyelid: Secondary | ICD-10-CM | POA: Diagnosis not present

## 2017-12-24 DIAGNOSIS — H26491 Other secondary cataract, right eye: Secondary | ICD-10-CM | POA: Diagnosis not present

## 2017-12-24 DIAGNOSIS — Z961 Presence of intraocular lens: Secondary | ICD-10-CM | POA: Diagnosis not present

## 2017-12-24 DIAGNOSIS — H02831 Dermatochalasis of right upper eyelid: Secondary | ICD-10-CM | POA: Diagnosis not present

## 2017-12-24 DIAGNOSIS — D3132 Benign neoplasm of left choroid: Secondary | ICD-10-CM | POA: Diagnosis not present

## 2017-12-31 DIAGNOSIS — Z299 Encounter for prophylactic measures, unspecified: Secondary | ICD-10-CM | POA: Diagnosis not present

## 2017-12-31 DIAGNOSIS — I10 Essential (primary) hypertension: Secondary | ICD-10-CM | POA: Diagnosis not present

## 2017-12-31 DIAGNOSIS — Z6835 Body mass index (BMI) 35.0-35.9, adult: Secondary | ICD-10-CM | POA: Diagnosis not present

## 2017-12-31 DIAGNOSIS — I2699 Other pulmonary embolism without acute cor pulmonale: Secondary | ICD-10-CM | POA: Diagnosis not present

## 2018-01-07 DIAGNOSIS — S42492D Other displaced fracture of lower end of left humerus, subsequent encounter for fracture with routine healing: Secondary | ICD-10-CM | POA: Diagnosis not present

## 2018-01-14 DIAGNOSIS — Z6836 Body mass index (BMI) 36.0-36.9, adult: Secondary | ICD-10-CM | POA: Diagnosis not present

## 2018-01-14 DIAGNOSIS — Z299 Encounter for prophylactic measures, unspecified: Secondary | ICD-10-CM | POA: Diagnosis not present

## 2018-01-14 DIAGNOSIS — I10 Essential (primary) hypertension: Secondary | ICD-10-CM | POA: Diagnosis not present

## 2018-01-14 DIAGNOSIS — Z713 Dietary counseling and surveillance: Secondary | ICD-10-CM | POA: Diagnosis not present

## 2018-01-14 DIAGNOSIS — I2699 Other pulmonary embolism without acute cor pulmonale: Secondary | ICD-10-CM | POA: Diagnosis not present

## 2018-01-20 DIAGNOSIS — I10 Essential (primary) hypertension: Secondary | ICD-10-CM | POA: Diagnosis not present

## 2018-01-20 DIAGNOSIS — M1710 Unilateral primary osteoarthritis, unspecified knee: Secondary | ICD-10-CM | POA: Diagnosis not present

## 2018-01-20 DIAGNOSIS — Z6836 Body mass index (BMI) 36.0-36.9, adult: Secondary | ICD-10-CM | POA: Diagnosis not present

## 2018-01-20 DIAGNOSIS — Z299 Encounter for prophylactic measures, unspecified: Secondary | ICD-10-CM | POA: Diagnosis not present

## 2018-01-20 DIAGNOSIS — I2699 Other pulmonary embolism without acute cor pulmonale: Secondary | ICD-10-CM | POA: Diagnosis not present

## 2018-02-02 DIAGNOSIS — I2699 Other pulmonary embolism without acute cor pulmonale: Secondary | ICD-10-CM | POA: Diagnosis not present

## 2018-02-02 DIAGNOSIS — Z713 Dietary counseling and surveillance: Secondary | ICD-10-CM | POA: Diagnosis not present

## 2018-02-02 DIAGNOSIS — I10 Essential (primary) hypertension: Secondary | ICD-10-CM | POA: Diagnosis not present

## 2018-02-02 DIAGNOSIS — Z6835 Body mass index (BMI) 35.0-35.9, adult: Secondary | ICD-10-CM | POA: Diagnosis not present

## 2018-02-02 DIAGNOSIS — Z299 Encounter for prophylactic measures, unspecified: Secondary | ICD-10-CM | POA: Diagnosis not present

## 2018-02-16 DIAGNOSIS — I10 Essential (primary) hypertension: Secondary | ICD-10-CM | POA: Diagnosis not present

## 2018-02-16 DIAGNOSIS — Z6834 Body mass index (BMI) 34.0-34.9, adult: Secondary | ICD-10-CM | POA: Diagnosis not present

## 2018-02-16 DIAGNOSIS — Z713 Dietary counseling and surveillance: Secondary | ICD-10-CM | POA: Diagnosis not present

## 2018-02-16 DIAGNOSIS — I2699 Other pulmonary embolism without acute cor pulmonale: Secondary | ICD-10-CM | POA: Diagnosis not present

## 2018-02-16 DIAGNOSIS — Z299 Encounter for prophylactic measures, unspecified: Secondary | ICD-10-CM | POA: Diagnosis not present

## 2018-03-17 DIAGNOSIS — I2699 Other pulmonary embolism without acute cor pulmonale: Secondary | ICD-10-CM | POA: Diagnosis not present

## 2018-03-17 DIAGNOSIS — Z713 Dietary counseling and surveillance: Secondary | ICD-10-CM | POA: Diagnosis not present

## 2018-03-17 DIAGNOSIS — Z6837 Body mass index (BMI) 37.0-37.9, adult: Secondary | ICD-10-CM | POA: Diagnosis not present

## 2018-03-17 DIAGNOSIS — Z299 Encounter for prophylactic measures, unspecified: Secondary | ICD-10-CM | POA: Diagnosis not present

## 2018-03-17 DIAGNOSIS — I10 Essential (primary) hypertension: Secondary | ICD-10-CM | POA: Diagnosis not present

## 2018-03-31 DIAGNOSIS — I10 Essential (primary) hypertension: Secondary | ICD-10-CM | POA: Diagnosis not present

## 2018-03-31 DIAGNOSIS — I2699 Other pulmonary embolism without acute cor pulmonale: Secondary | ICD-10-CM | POA: Diagnosis not present

## 2018-03-31 DIAGNOSIS — Z6837 Body mass index (BMI) 37.0-37.9, adult: Secondary | ICD-10-CM | POA: Diagnosis not present

## 2018-03-31 DIAGNOSIS — Z713 Dietary counseling and surveillance: Secondary | ICD-10-CM | POA: Diagnosis not present

## 2018-03-31 DIAGNOSIS — Z299 Encounter for prophylactic measures, unspecified: Secondary | ICD-10-CM | POA: Diagnosis not present

## 2018-04-28 DIAGNOSIS — Z1231 Encounter for screening mammogram for malignant neoplasm of breast: Secondary | ICD-10-CM | POA: Diagnosis not present

## 2018-05-04 DIAGNOSIS — I10 Essential (primary) hypertension: Secondary | ICD-10-CM | POA: Diagnosis not present

## 2018-05-04 DIAGNOSIS — I2699 Other pulmonary embolism without acute cor pulmonale: Secondary | ICD-10-CM | POA: Diagnosis not present

## 2018-05-04 DIAGNOSIS — Z6835 Body mass index (BMI) 35.0-35.9, adult: Secondary | ICD-10-CM | POA: Diagnosis not present

## 2018-05-04 DIAGNOSIS — Z23 Encounter for immunization: Secondary | ICD-10-CM | POA: Diagnosis not present

## 2018-05-04 DIAGNOSIS — Z713 Dietary counseling and surveillance: Secondary | ICD-10-CM | POA: Diagnosis not present

## 2018-05-04 DIAGNOSIS — Z299 Encounter for prophylactic measures, unspecified: Secondary | ICD-10-CM | POA: Diagnosis not present

## 2018-05-07 DIAGNOSIS — Z713 Dietary counseling and surveillance: Secondary | ICD-10-CM | POA: Diagnosis not present

## 2018-05-07 DIAGNOSIS — Z299 Encounter for prophylactic measures, unspecified: Secondary | ICD-10-CM | POA: Diagnosis not present

## 2018-05-07 DIAGNOSIS — L820 Inflamed seborrheic keratosis: Secondary | ICD-10-CM | POA: Diagnosis not present

## 2018-05-07 DIAGNOSIS — I10 Essential (primary) hypertension: Secondary | ICD-10-CM | POA: Diagnosis not present

## 2018-05-07 DIAGNOSIS — Z6835 Body mass index (BMI) 35.0-35.9, adult: Secondary | ICD-10-CM | POA: Diagnosis not present

## 2018-05-07 DIAGNOSIS — L989 Disorder of the skin and subcutaneous tissue, unspecified: Secondary | ICD-10-CM | POA: Diagnosis not present

## 2018-05-07 DIAGNOSIS — I2699 Other pulmonary embolism without acute cor pulmonale: Secondary | ICD-10-CM | POA: Diagnosis not present

## 2018-05-07 DIAGNOSIS — L821 Other seborrheic keratosis: Secondary | ICD-10-CM | POA: Diagnosis not present

## 2018-05-18 DIAGNOSIS — I10 Essential (primary) hypertension: Secondary | ICD-10-CM | POA: Diagnosis not present

## 2018-05-18 DIAGNOSIS — M159 Polyosteoarthritis, unspecified: Secondary | ICD-10-CM | POA: Diagnosis not present

## 2018-05-20 DIAGNOSIS — J209 Acute bronchitis, unspecified: Secondary | ICD-10-CM | POA: Diagnosis not present

## 2018-05-20 DIAGNOSIS — I2699 Other pulmonary embolism without acute cor pulmonale: Secondary | ICD-10-CM | POA: Diagnosis not present

## 2018-05-20 DIAGNOSIS — I10 Essential (primary) hypertension: Secondary | ICD-10-CM | POA: Diagnosis not present

## 2018-05-20 DIAGNOSIS — Z299 Encounter for prophylactic measures, unspecified: Secondary | ICD-10-CM | POA: Diagnosis not present

## 2018-05-20 DIAGNOSIS — Z6835 Body mass index (BMI) 35.0-35.9, adult: Secondary | ICD-10-CM | POA: Diagnosis not present

## 2018-06-16 DIAGNOSIS — I10 Essential (primary) hypertension: Secondary | ICD-10-CM | POA: Diagnosis not present

## 2018-06-16 DIAGNOSIS — M159 Polyosteoarthritis, unspecified: Secondary | ICD-10-CM | POA: Diagnosis not present

## 2018-06-30 DIAGNOSIS — Z1211 Encounter for screening for malignant neoplasm of colon: Secondary | ICD-10-CM | POA: Diagnosis not present

## 2018-06-30 DIAGNOSIS — Z7189 Other specified counseling: Secondary | ICD-10-CM | POA: Diagnosis not present

## 2018-06-30 DIAGNOSIS — Z299 Encounter for prophylactic measures, unspecified: Secondary | ICD-10-CM | POA: Diagnosis not present

## 2018-06-30 DIAGNOSIS — I2699 Other pulmonary embolism without acute cor pulmonale: Secondary | ICD-10-CM | POA: Diagnosis not present

## 2018-06-30 DIAGNOSIS — E559 Vitamin D deficiency, unspecified: Secondary | ICD-10-CM | POA: Diagnosis not present

## 2018-06-30 DIAGNOSIS — Z6835 Body mass index (BMI) 35.0-35.9, adult: Secondary | ICD-10-CM | POA: Diagnosis not present

## 2018-06-30 DIAGNOSIS — E7800 Pure hypercholesterolemia, unspecified: Secondary | ICD-10-CM | POA: Diagnosis not present

## 2018-06-30 DIAGNOSIS — Z0000 Encounter for general adult medical examination without abnormal findings: Secondary | ICD-10-CM | POA: Diagnosis not present

## 2018-06-30 DIAGNOSIS — Z1331 Encounter for screening for depression: Secondary | ICD-10-CM | POA: Diagnosis not present

## 2018-06-30 DIAGNOSIS — R5383 Other fatigue: Secondary | ICD-10-CM | POA: Diagnosis not present

## 2018-06-30 DIAGNOSIS — Z1339 Encounter for screening examination for other mental health and behavioral disorders: Secondary | ICD-10-CM | POA: Diagnosis not present

## 2018-06-30 DIAGNOSIS — Z79899 Other long term (current) drug therapy: Secondary | ICD-10-CM | POA: Diagnosis not present

## 2018-07-30 DIAGNOSIS — I2699 Other pulmonary embolism without acute cor pulmonale: Secondary | ICD-10-CM | POA: Diagnosis not present

## 2018-07-30 DIAGNOSIS — Z299 Encounter for prophylactic measures, unspecified: Secondary | ICD-10-CM | POA: Diagnosis not present

## 2018-07-30 DIAGNOSIS — I10 Essential (primary) hypertension: Secondary | ICD-10-CM | POA: Diagnosis not present

## 2018-07-30 DIAGNOSIS — Z6835 Body mass index (BMI) 35.0-35.9, adult: Secondary | ICD-10-CM | POA: Diagnosis not present

## 2018-08-11 DIAGNOSIS — M159 Polyosteoarthritis, unspecified: Secondary | ICD-10-CM | POA: Diagnosis not present

## 2018-08-11 DIAGNOSIS — I10 Essential (primary) hypertension: Secondary | ICD-10-CM | POA: Diagnosis not present

## 2018-08-31 DIAGNOSIS — Z6835 Body mass index (BMI) 35.0-35.9, adult: Secondary | ICD-10-CM | POA: Diagnosis not present

## 2018-08-31 DIAGNOSIS — Z299 Encounter for prophylactic measures, unspecified: Secondary | ICD-10-CM | POA: Diagnosis not present

## 2018-08-31 DIAGNOSIS — I2699 Other pulmonary embolism without acute cor pulmonale: Secondary | ICD-10-CM | POA: Diagnosis not present

## 2018-08-31 DIAGNOSIS — I10 Essential (primary) hypertension: Secondary | ICD-10-CM | POA: Diagnosis not present

## 2018-08-31 DIAGNOSIS — Z789 Other specified health status: Secondary | ICD-10-CM | POA: Diagnosis not present

## 2018-09-10 DIAGNOSIS — E2839 Other primary ovarian failure: Secondary | ICD-10-CM | POA: Diagnosis not present

## 2018-09-17 DIAGNOSIS — M159 Polyosteoarthritis, unspecified: Secondary | ICD-10-CM | POA: Diagnosis not present

## 2018-09-17 DIAGNOSIS — I10 Essential (primary) hypertension: Secondary | ICD-10-CM | POA: Diagnosis not present

## 2018-09-30 DIAGNOSIS — Z6835 Body mass index (BMI) 35.0-35.9, adult: Secondary | ICD-10-CM | POA: Diagnosis not present

## 2018-09-30 DIAGNOSIS — Z299 Encounter for prophylactic measures, unspecified: Secondary | ICD-10-CM | POA: Diagnosis not present

## 2018-09-30 DIAGNOSIS — I2699 Other pulmonary embolism without acute cor pulmonale: Secondary | ICD-10-CM | POA: Diagnosis not present

## 2018-09-30 DIAGNOSIS — I10 Essential (primary) hypertension: Secondary | ICD-10-CM | POA: Diagnosis not present

## 2018-10-29 DIAGNOSIS — J069 Acute upper respiratory infection, unspecified: Secondary | ICD-10-CM | POA: Diagnosis not present

## 2018-10-29 DIAGNOSIS — Z299 Encounter for prophylactic measures, unspecified: Secondary | ICD-10-CM | POA: Diagnosis not present

## 2018-10-29 DIAGNOSIS — Z6835 Body mass index (BMI) 35.0-35.9, adult: Secondary | ICD-10-CM | POA: Diagnosis not present

## 2018-10-29 DIAGNOSIS — I2699 Other pulmonary embolism without acute cor pulmonale: Secondary | ICD-10-CM | POA: Diagnosis not present

## 2018-10-29 DIAGNOSIS — I10 Essential (primary) hypertension: Secondary | ICD-10-CM | POA: Diagnosis not present

## 2018-11-05 DIAGNOSIS — I10 Essential (primary) hypertension: Secondary | ICD-10-CM | POA: Diagnosis not present

## 2018-11-05 DIAGNOSIS — M159 Polyosteoarthritis, unspecified: Secondary | ICD-10-CM | POA: Diagnosis not present

## 2018-12-01 DIAGNOSIS — Z713 Dietary counseling and surveillance: Secondary | ICD-10-CM | POA: Diagnosis not present

## 2018-12-01 DIAGNOSIS — Z6835 Body mass index (BMI) 35.0-35.9, adult: Secondary | ICD-10-CM | POA: Diagnosis not present

## 2018-12-01 DIAGNOSIS — I10 Essential (primary) hypertension: Secondary | ICD-10-CM | POA: Diagnosis not present

## 2018-12-01 DIAGNOSIS — I2699 Other pulmonary embolism without acute cor pulmonale: Secondary | ICD-10-CM | POA: Diagnosis not present

## 2018-12-01 DIAGNOSIS — Z299 Encounter for prophylactic measures, unspecified: Secondary | ICD-10-CM | POA: Diagnosis not present

## 2018-12-03 DIAGNOSIS — M159 Polyosteoarthritis, unspecified: Secondary | ICD-10-CM | POA: Diagnosis not present

## 2018-12-03 DIAGNOSIS — I10 Essential (primary) hypertension: Secondary | ICD-10-CM | POA: Diagnosis not present

## 2018-12-15 DIAGNOSIS — Z713 Dietary counseling and surveillance: Secondary | ICD-10-CM | POA: Diagnosis not present

## 2018-12-15 DIAGNOSIS — I2699 Other pulmonary embolism without acute cor pulmonale: Secondary | ICD-10-CM | POA: Diagnosis not present

## 2018-12-15 DIAGNOSIS — Z6835 Body mass index (BMI) 35.0-35.9, adult: Secondary | ICD-10-CM | POA: Diagnosis not present

## 2018-12-15 DIAGNOSIS — I10 Essential (primary) hypertension: Secondary | ICD-10-CM | POA: Diagnosis not present

## 2018-12-15 DIAGNOSIS — Z299 Encounter for prophylactic measures, unspecified: Secondary | ICD-10-CM | POA: Diagnosis not present

## 2018-12-29 DIAGNOSIS — H43813 Vitreous degeneration, bilateral: Secondary | ICD-10-CM | POA: Diagnosis not present

## 2018-12-29 DIAGNOSIS — H02834 Dermatochalasis of left upper eyelid: Secondary | ICD-10-CM | POA: Diagnosis not present

## 2018-12-29 DIAGNOSIS — Z961 Presence of intraocular lens: Secondary | ICD-10-CM | POA: Diagnosis not present

## 2018-12-29 DIAGNOSIS — D3132 Benign neoplasm of left choroid: Secondary | ICD-10-CM | POA: Diagnosis not present

## 2018-12-29 DIAGNOSIS — H26491 Other secondary cataract, right eye: Secondary | ICD-10-CM | POA: Diagnosis not present

## 2018-12-29 DIAGNOSIS — H02831 Dermatochalasis of right upper eyelid: Secondary | ICD-10-CM | POA: Diagnosis not present

## 2018-12-30 DIAGNOSIS — I10 Essential (primary) hypertension: Secondary | ICD-10-CM | POA: Diagnosis not present

## 2018-12-30 DIAGNOSIS — M159 Polyosteoarthritis, unspecified: Secondary | ICD-10-CM | POA: Diagnosis not present

## 2019-01-13 DIAGNOSIS — Z713 Dietary counseling and surveillance: Secondary | ICD-10-CM | POA: Diagnosis not present

## 2019-01-13 DIAGNOSIS — I2699 Other pulmonary embolism without acute cor pulmonale: Secondary | ICD-10-CM | POA: Diagnosis not present

## 2019-01-13 DIAGNOSIS — Z6835 Body mass index (BMI) 35.0-35.9, adult: Secondary | ICD-10-CM | POA: Diagnosis not present

## 2019-01-13 DIAGNOSIS — Z299 Encounter for prophylactic measures, unspecified: Secondary | ICD-10-CM | POA: Diagnosis not present

## 2019-01-19 DIAGNOSIS — H26491 Other secondary cataract, right eye: Secondary | ICD-10-CM | POA: Diagnosis not present

## 2019-01-27 DIAGNOSIS — I10 Essential (primary) hypertension: Secondary | ICD-10-CM | POA: Diagnosis not present

## 2019-01-27 DIAGNOSIS — M159 Polyosteoarthritis, unspecified: Secondary | ICD-10-CM | POA: Diagnosis not present

## 2019-02-10 DIAGNOSIS — I10 Essential (primary) hypertension: Secondary | ICD-10-CM | POA: Diagnosis not present

## 2019-02-10 DIAGNOSIS — I2699 Other pulmonary embolism without acute cor pulmonale: Secondary | ICD-10-CM | POA: Diagnosis not present

## 2019-02-10 DIAGNOSIS — Z299 Encounter for prophylactic measures, unspecified: Secondary | ICD-10-CM | POA: Diagnosis not present

## 2019-02-10 DIAGNOSIS — Z713 Dietary counseling and surveillance: Secondary | ICD-10-CM | POA: Diagnosis not present

## 2019-02-10 DIAGNOSIS — Z6834 Body mass index (BMI) 34.0-34.9, adult: Secondary | ICD-10-CM | POA: Diagnosis not present

## 2019-02-15 DIAGNOSIS — M159 Polyosteoarthritis, unspecified: Secondary | ICD-10-CM | POA: Diagnosis not present

## 2019-02-15 DIAGNOSIS — I10 Essential (primary) hypertension: Secondary | ICD-10-CM | POA: Diagnosis not present

## 2019-03-15 DIAGNOSIS — Z6834 Body mass index (BMI) 34.0-34.9, adult: Secondary | ICD-10-CM | POA: Diagnosis not present

## 2019-03-15 DIAGNOSIS — E7800 Pure hypercholesterolemia, unspecified: Secondary | ICD-10-CM | POA: Diagnosis not present

## 2019-03-15 DIAGNOSIS — Z299 Encounter for prophylactic measures, unspecified: Secondary | ICD-10-CM | POA: Diagnosis not present

## 2019-03-15 DIAGNOSIS — I10 Essential (primary) hypertension: Secondary | ICD-10-CM | POA: Diagnosis not present

## 2019-03-15 DIAGNOSIS — I2699 Other pulmonary embolism without acute cor pulmonale: Secondary | ICD-10-CM | POA: Diagnosis not present

## 2019-03-16 DIAGNOSIS — M25462 Effusion, left knee: Secondary | ICD-10-CM | POA: Diagnosis not present

## 2019-03-16 DIAGNOSIS — Z96652 Presence of left artificial knee joint: Secondary | ICD-10-CM | POA: Diagnosis not present

## 2019-03-16 DIAGNOSIS — I10 Essential (primary) hypertension: Secondary | ICD-10-CM | POA: Diagnosis not present

## 2019-03-16 DIAGNOSIS — M1711 Unilateral primary osteoarthritis, right knee: Secondary | ICD-10-CM | POA: Diagnosis not present

## 2019-03-16 DIAGNOSIS — M25561 Pain in right knee: Secondary | ICD-10-CM | POA: Diagnosis not present

## 2019-03-16 DIAGNOSIS — Z6834 Body mass index (BMI) 34.0-34.9, adult: Secondary | ICD-10-CM | POA: Diagnosis not present

## 2019-03-16 DIAGNOSIS — E7800 Pure hypercholesterolemia, unspecified: Secondary | ICD-10-CM | POA: Diagnosis not present

## 2019-03-16 DIAGNOSIS — Z299 Encounter for prophylactic measures, unspecified: Secondary | ICD-10-CM | POA: Diagnosis not present

## 2019-03-16 DIAGNOSIS — I2699 Other pulmonary embolism without acute cor pulmonale: Secondary | ICD-10-CM | POA: Diagnosis not present

## 2019-03-18 DIAGNOSIS — M159 Polyosteoarthritis, unspecified: Secondary | ICD-10-CM | POA: Diagnosis not present

## 2019-03-18 DIAGNOSIS — I10 Essential (primary) hypertension: Secondary | ICD-10-CM | POA: Diagnosis not present

## 2019-03-30 DIAGNOSIS — Z6833 Body mass index (BMI) 33.0-33.9, adult: Secondary | ICD-10-CM | POA: Diagnosis not present

## 2019-03-30 DIAGNOSIS — M1711 Unilateral primary osteoarthritis, right knee: Secondary | ICD-10-CM | POA: Diagnosis not present

## 2019-03-30 DIAGNOSIS — I10 Essential (primary) hypertension: Secondary | ICD-10-CM | POA: Diagnosis not present

## 2019-03-30 DIAGNOSIS — Z299 Encounter for prophylactic measures, unspecified: Secondary | ICD-10-CM | POA: Diagnosis not present

## 2019-04-06 DIAGNOSIS — Z299 Encounter for prophylactic measures, unspecified: Secondary | ICD-10-CM | POA: Diagnosis not present

## 2019-04-06 DIAGNOSIS — M1711 Unilateral primary osteoarthritis, right knee: Secondary | ICD-10-CM | POA: Diagnosis not present

## 2019-04-06 DIAGNOSIS — Z6833 Body mass index (BMI) 33.0-33.9, adult: Secondary | ICD-10-CM | POA: Diagnosis not present

## 2019-04-06 DIAGNOSIS — I2699 Other pulmonary embolism without acute cor pulmonale: Secondary | ICD-10-CM | POA: Diagnosis not present

## 2019-04-06 DIAGNOSIS — I10 Essential (primary) hypertension: Secondary | ICD-10-CM | POA: Diagnosis not present

## 2019-04-13 DIAGNOSIS — I10 Essential (primary) hypertension: Secondary | ICD-10-CM | POA: Diagnosis not present

## 2019-04-13 DIAGNOSIS — M1710 Unilateral primary osteoarthritis, unspecified knee: Secondary | ICD-10-CM | POA: Diagnosis not present

## 2019-04-13 DIAGNOSIS — Z6833 Body mass index (BMI) 33.0-33.9, adult: Secondary | ICD-10-CM | POA: Diagnosis not present

## 2019-04-13 DIAGNOSIS — Z299 Encounter for prophylactic measures, unspecified: Secondary | ICD-10-CM | POA: Diagnosis not present

## 2019-04-13 DIAGNOSIS — M1711 Unilateral primary osteoarthritis, right knee: Secondary | ICD-10-CM | POA: Diagnosis not present

## 2019-04-20 DIAGNOSIS — Z6833 Body mass index (BMI) 33.0-33.9, adult: Secondary | ICD-10-CM | POA: Diagnosis not present

## 2019-04-20 DIAGNOSIS — M1711 Unilateral primary osteoarthritis, right knee: Secondary | ICD-10-CM | POA: Diagnosis not present

## 2019-04-20 DIAGNOSIS — Z789 Other specified health status: Secondary | ICD-10-CM | POA: Diagnosis not present

## 2019-04-20 DIAGNOSIS — I2699 Other pulmonary embolism without acute cor pulmonale: Secondary | ICD-10-CM | POA: Diagnosis not present

## 2019-04-20 DIAGNOSIS — Z299 Encounter for prophylactic measures, unspecified: Secondary | ICD-10-CM | POA: Diagnosis not present

## 2019-04-20 DIAGNOSIS — I10 Essential (primary) hypertension: Secondary | ICD-10-CM | POA: Diagnosis not present

## 2019-04-27 DIAGNOSIS — I2699 Other pulmonary embolism without acute cor pulmonale: Secondary | ICD-10-CM | POA: Diagnosis not present

## 2019-04-27 DIAGNOSIS — M25561 Pain in right knee: Secondary | ICD-10-CM | POA: Diagnosis not present

## 2019-04-27 DIAGNOSIS — I10 Essential (primary) hypertension: Secondary | ICD-10-CM | POA: Diagnosis not present

## 2019-04-27 DIAGNOSIS — Z6833 Body mass index (BMI) 33.0-33.9, adult: Secondary | ICD-10-CM | POA: Diagnosis not present

## 2019-04-27 DIAGNOSIS — M1711 Unilateral primary osteoarthritis, right knee: Secondary | ICD-10-CM | POA: Diagnosis not present

## 2019-04-27 DIAGNOSIS — Z299 Encounter for prophylactic measures, unspecified: Secondary | ICD-10-CM | POA: Diagnosis not present

## 2019-05-03 DIAGNOSIS — Z23 Encounter for immunization: Secondary | ICD-10-CM | POA: Diagnosis not present

## 2019-05-04 DIAGNOSIS — I10 Essential (primary) hypertension: Secondary | ICD-10-CM | POA: Diagnosis not present

## 2019-05-04 DIAGNOSIS — M1711 Unilateral primary osteoarthritis, right knee: Secondary | ICD-10-CM | POA: Diagnosis not present

## 2019-05-04 DIAGNOSIS — Z6833 Body mass index (BMI) 33.0-33.9, adult: Secondary | ICD-10-CM | POA: Diagnosis not present

## 2019-05-04 DIAGNOSIS — Z299 Encounter for prophylactic measures, unspecified: Secondary | ICD-10-CM | POA: Diagnosis not present

## 2019-05-05 DIAGNOSIS — M159 Polyosteoarthritis, unspecified: Secondary | ICD-10-CM | POA: Diagnosis not present

## 2019-05-05 DIAGNOSIS — I10 Essential (primary) hypertension: Secondary | ICD-10-CM | POA: Diagnosis not present

## 2019-05-25 DIAGNOSIS — M159 Polyosteoarthritis, unspecified: Secondary | ICD-10-CM | POA: Diagnosis not present

## 2019-05-25 DIAGNOSIS — I10 Essential (primary) hypertension: Secondary | ICD-10-CM | POA: Diagnosis not present

## 2019-05-31 DIAGNOSIS — Z6833 Body mass index (BMI) 33.0-33.9, adult: Secondary | ICD-10-CM | POA: Diagnosis not present

## 2019-05-31 DIAGNOSIS — Z299 Encounter for prophylactic measures, unspecified: Secondary | ICD-10-CM | POA: Diagnosis not present

## 2019-05-31 DIAGNOSIS — I10 Essential (primary) hypertension: Secondary | ICD-10-CM | POA: Diagnosis not present

## 2019-05-31 DIAGNOSIS — K047 Periapical abscess without sinus: Secondary | ICD-10-CM | POA: Diagnosis not present

## 2019-05-31 DIAGNOSIS — I2699 Other pulmonary embolism without acute cor pulmonale: Secondary | ICD-10-CM | POA: Diagnosis not present

## 2019-06-14 DIAGNOSIS — Z713 Dietary counseling and surveillance: Secondary | ICD-10-CM | POA: Diagnosis not present

## 2019-06-14 DIAGNOSIS — Z6834 Body mass index (BMI) 34.0-34.9, adult: Secondary | ICD-10-CM | POA: Diagnosis not present

## 2019-06-14 DIAGNOSIS — Z299 Encounter for prophylactic measures, unspecified: Secondary | ICD-10-CM | POA: Diagnosis not present

## 2019-06-14 DIAGNOSIS — I10 Essential (primary) hypertension: Secondary | ICD-10-CM | POA: Diagnosis not present

## 2019-06-14 DIAGNOSIS — I2699 Other pulmonary embolism without acute cor pulmonale: Secondary | ICD-10-CM | POA: Diagnosis not present

## 2019-06-28 DIAGNOSIS — I2699 Other pulmonary embolism without acute cor pulmonale: Secondary | ICD-10-CM | POA: Diagnosis not present

## 2019-06-28 DIAGNOSIS — Z299 Encounter for prophylactic measures, unspecified: Secondary | ICD-10-CM | POA: Diagnosis not present

## 2019-06-28 DIAGNOSIS — Z713 Dietary counseling and surveillance: Secondary | ICD-10-CM | POA: Diagnosis not present

## 2019-06-28 DIAGNOSIS — I10 Essential (primary) hypertension: Secondary | ICD-10-CM | POA: Diagnosis not present

## 2019-06-28 DIAGNOSIS — Z6833 Body mass index (BMI) 33.0-33.9, adult: Secondary | ICD-10-CM | POA: Diagnosis not present

## 2019-07-16 DIAGNOSIS — I10 Essential (primary) hypertension: Secondary | ICD-10-CM | POA: Diagnosis not present

## 2019-07-16 DIAGNOSIS — Z20828 Contact with and (suspected) exposure to other viral communicable diseases: Secondary | ICD-10-CM | POA: Diagnosis not present

## 2019-07-16 DIAGNOSIS — Z6836 Body mass index (BMI) 36.0-36.9, adult: Secondary | ICD-10-CM | POA: Diagnosis not present

## 2019-07-16 DIAGNOSIS — Z299 Encounter for prophylactic measures, unspecified: Secondary | ICD-10-CM | POA: Diagnosis not present

## 2019-07-20 DIAGNOSIS — Z6833 Body mass index (BMI) 33.0-33.9, adult: Secondary | ICD-10-CM | POA: Diagnosis not present

## 2019-07-20 DIAGNOSIS — Z299 Encounter for prophylactic measures, unspecified: Secondary | ICD-10-CM | POA: Diagnosis not present

## 2019-07-20 DIAGNOSIS — Z713 Dietary counseling and surveillance: Secondary | ICD-10-CM | POA: Diagnosis not present

## 2019-07-20 DIAGNOSIS — I2699 Other pulmonary embolism without acute cor pulmonale: Secondary | ICD-10-CM | POA: Diagnosis not present

## 2019-07-20 DIAGNOSIS — R05 Cough: Secondary | ICD-10-CM | POA: Diagnosis not present

## 2019-07-21 DIAGNOSIS — M159 Polyosteoarthritis, unspecified: Secondary | ICD-10-CM | POA: Diagnosis not present

## 2019-07-21 DIAGNOSIS — I10 Essential (primary) hypertension: Secondary | ICD-10-CM | POA: Diagnosis not present

## 2019-07-22 DIAGNOSIS — J1289 Other viral pneumonia: Secondary | ICD-10-CM | POA: Diagnosis not present

## 2019-07-22 DIAGNOSIS — J9601 Acute respiratory failure with hypoxia: Secondary | ICD-10-CM | POA: Diagnosis not present

## 2019-07-22 DIAGNOSIS — R0602 Shortness of breath: Secondary | ICD-10-CM | POA: Diagnosis not present

## 2019-07-22 DIAGNOSIS — U071 COVID-19: Secondary | ICD-10-CM | POA: Diagnosis not present

## 2019-07-23 DIAGNOSIS — J9601 Acute respiratory failure with hypoxia: Secondary | ICD-10-CM | POA: Diagnosis not present

## 2019-07-23 DIAGNOSIS — R0602 Shortness of breath: Secondary | ICD-10-CM | POA: Diagnosis not present

## 2019-07-23 DIAGNOSIS — U071 COVID-19: Secondary | ICD-10-CM | POA: Diagnosis not present

## 2019-07-23 DIAGNOSIS — J1289 Other viral pneumonia: Secondary | ICD-10-CM | POA: Diagnosis not present

## 2019-07-24 DIAGNOSIS — J1289 Other viral pneumonia: Secondary | ICD-10-CM | POA: Diagnosis not present

## 2019-07-24 DIAGNOSIS — R0602 Shortness of breath: Secondary | ICD-10-CM | POA: Diagnosis not present

## 2019-07-24 DIAGNOSIS — J9601 Acute respiratory failure with hypoxia: Secondary | ICD-10-CM | POA: Diagnosis not present

## 2019-07-24 DIAGNOSIS — U071 COVID-19: Secondary | ICD-10-CM | POA: Diagnosis not present

## 2019-07-25 DIAGNOSIS — R0602 Shortness of breath: Secondary | ICD-10-CM | POA: Diagnosis not present

## 2019-07-25 DIAGNOSIS — J1289 Other viral pneumonia: Secondary | ICD-10-CM | POA: Diagnosis not present

## 2019-07-25 DIAGNOSIS — J9601 Acute respiratory failure with hypoxia: Secondary | ICD-10-CM | POA: Diagnosis not present

## 2019-07-25 DIAGNOSIS — U071 COVID-19: Secondary | ICD-10-CM | POA: Diagnosis not present

## 2019-07-26 DIAGNOSIS — J1289 Other viral pneumonia: Secondary | ICD-10-CM | POA: Diagnosis not present

## 2019-07-26 DIAGNOSIS — J9601 Acute respiratory failure with hypoxia: Secondary | ICD-10-CM | POA: Diagnosis not present

## 2019-07-26 DIAGNOSIS — U071 COVID-19: Secondary | ICD-10-CM | POA: Diagnosis not present

## 2019-07-26 DIAGNOSIS — R0602 Shortness of breath: Secondary | ICD-10-CM | POA: Diagnosis not present

## 2019-07-27 DIAGNOSIS — R0602 Shortness of breath: Secondary | ICD-10-CM | POA: Diagnosis not present

## 2019-07-27 DIAGNOSIS — J9601 Acute respiratory failure with hypoxia: Secondary | ICD-10-CM | POA: Diagnosis not present

## 2019-07-27 DIAGNOSIS — U071 COVID-19: Secondary | ICD-10-CM | POA: Diagnosis not present

## 2019-07-27 DIAGNOSIS — J1289 Other viral pneumonia: Secondary | ICD-10-CM | POA: Diagnosis not present

## 2019-07-28 DIAGNOSIS — R0602 Shortness of breath: Secondary | ICD-10-CM | POA: Diagnosis not present

## 2019-07-28 DIAGNOSIS — J9601 Acute respiratory failure with hypoxia: Secondary | ICD-10-CM | POA: Diagnosis not present

## 2019-07-28 DIAGNOSIS — J1289 Other viral pneumonia: Secondary | ICD-10-CM | POA: Diagnosis not present

## 2019-07-28 DIAGNOSIS — U071 COVID-19: Secondary | ICD-10-CM | POA: Diagnosis not present

## 2019-07-29 DIAGNOSIS — R0602 Shortness of breath: Secondary | ICD-10-CM | POA: Diagnosis not present

## 2019-07-29 DIAGNOSIS — J1289 Other viral pneumonia: Secondary | ICD-10-CM | POA: Diagnosis not present

## 2019-07-29 DIAGNOSIS — J9601 Acute respiratory failure with hypoxia: Secondary | ICD-10-CM | POA: Diagnosis not present

## 2019-07-29 DIAGNOSIS — U071 COVID-19: Secondary | ICD-10-CM | POA: Diagnosis not present

## 2019-07-30 DIAGNOSIS — U071 COVID-19: Secondary | ICD-10-CM | POA: Diagnosis not present

## 2019-07-30 DIAGNOSIS — R0602 Shortness of breath: Secondary | ICD-10-CM | POA: Diagnosis not present

## 2019-07-30 DIAGNOSIS — J1289 Other viral pneumonia: Secondary | ICD-10-CM | POA: Diagnosis not present

## 2019-07-30 DIAGNOSIS — J9601 Acute respiratory failure with hypoxia: Secondary | ICD-10-CM | POA: Diagnosis not present

## 2019-07-31 DIAGNOSIS — J9601 Acute respiratory failure with hypoxia: Secondary | ICD-10-CM | POA: Diagnosis not present

## 2019-07-31 DIAGNOSIS — R0602 Shortness of breath: Secondary | ICD-10-CM | POA: Diagnosis not present

## 2019-07-31 DIAGNOSIS — U071 COVID-19: Secondary | ICD-10-CM | POA: Diagnosis not present

## 2019-07-31 DIAGNOSIS — J1289 Other viral pneumonia: Secondary | ICD-10-CM | POA: Diagnosis not present

## 2019-08-01 DIAGNOSIS — R0602 Shortness of breath: Secondary | ICD-10-CM | POA: Diagnosis not present

## 2019-08-01 DIAGNOSIS — U071 COVID-19: Secondary | ICD-10-CM | POA: Diagnosis not present

## 2019-08-01 DIAGNOSIS — J9601 Acute respiratory failure with hypoxia: Secondary | ICD-10-CM | POA: Diagnosis not present

## 2019-08-01 DIAGNOSIS — J1289 Other viral pneumonia: Secondary | ICD-10-CM | POA: Diagnosis not present

## 2019-08-02 DIAGNOSIS — U071 COVID-19: Secondary | ICD-10-CM | POA: Diagnosis not present

## 2019-08-02 DIAGNOSIS — J9601 Acute respiratory failure with hypoxia: Secondary | ICD-10-CM | POA: Diagnosis not present

## 2019-08-02 DIAGNOSIS — R0602 Shortness of breath: Secondary | ICD-10-CM | POA: Diagnosis not present

## 2019-08-02 DIAGNOSIS — J1289 Other viral pneumonia: Secondary | ICD-10-CM | POA: Diagnosis not present

## 2019-08-03 DIAGNOSIS — R0602 Shortness of breath: Secondary | ICD-10-CM | POA: Diagnosis not present

## 2019-08-03 DIAGNOSIS — J1289 Other viral pneumonia: Secondary | ICD-10-CM | POA: Diagnosis not present

## 2019-08-03 DIAGNOSIS — U071 COVID-19: Secondary | ICD-10-CM | POA: Diagnosis not present

## 2019-08-03 DIAGNOSIS — J9601 Acute respiratory failure with hypoxia: Secondary | ICD-10-CM | POA: Diagnosis not present

## 2019-08-05 ENCOUNTER — Inpatient Hospital Stay (HOSPITAL_COMMUNITY)
Admission: EM | Admit: 2019-08-05 | Discharge: 2019-08-12 | DRG: 292 | Disposition: A | Discharge status: Skilled Nursing Facility | Payer: Medicare Other | Attending: Family Medicine | Admitting: Family Medicine

## 2019-08-05 ENCOUNTER — Emergency Department (HOSPITAL_COMMUNITY): Payer: Medicare Other

## 2019-08-05 ENCOUNTER — Other Ambulatory Visit: Payer: Self-pay

## 2019-08-05 ENCOUNTER — Encounter (HOSPITAL_COMMUNITY): Payer: Self-pay | Admitting: Emergency Medicine

## 2019-08-05 DIAGNOSIS — R609 Edema, unspecified: Secondary | ICD-10-CM

## 2019-08-05 DIAGNOSIS — E871 Hypo-osmolality and hyponatremia: Secondary | ICD-10-CM

## 2019-08-05 DIAGNOSIS — Z888 Allergy status to other drugs, medicaments and biological substances status: Secondary | ICD-10-CM

## 2019-08-05 DIAGNOSIS — I10 Essential (primary) hypertension: Secondary | ICD-10-CM | POA: Diagnosis present

## 2019-08-05 DIAGNOSIS — L89102 Pressure ulcer of unspecified part of back, stage 2: Secondary | ICD-10-CM | POA: Diagnosis present

## 2019-08-05 DIAGNOSIS — R600 Localized edema: Secondary | ICD-10-CM | POA: Diagnosis not present

## 2019-08-05 DIAGNOSIS — R531 Weakness: Secondary | ICD-10-CM

## 2019-08-05 DIAGNOSIS — Z20828 Contact with and (suspected) exposure to other viral communicable diseases: Secondary | ICD-10-CM | POA: Diagnosis present

## 2019-08-05 DIAGNOSIS — L8990 Pressure ulcer of unspecified site, unspecified stage: Secondary | ICD-10-CM | POA: Insufficient documentation

## 2019-08-05 DIAGNOSIS — D6859 Other primary thrombophilia: Secondary | ICD-10-CM | POA: Diagnosis present

## 2019-08-05 DIAGNOSIS — J45909 Unspecified asthma, uncomplicated: Secondary | ICD-10-CM | POA: Diagnosis present

## 2019-08-05 DIAGNOSIS — Z91048 Other nonmedicinal substance allergy status: Secondary | ICD-10-CM

## 2019-08-05 DIAGNOSIS — J189 Pneumonia, unspecified organism: Secondary | ICD-10-CM

## 2019-08-05 DIAGNOSIS — I5033 Acute on chronic diastolic (congestive) heart failure: Secondary | ICD-10-CM | POA: Diagnosis present

## 2019-08-05 DIAGNOSIS — Z7901 Long term (current) use of anticoagulants: Secondary | ICD-10-CM

## 2019-08-05 DIAGNOSIS — E8809 Other disorders of plasma-protein metabolism, not elsewhere classified: Secondary | ICD-10-CM

## 2019-08-05 DIAGNOSIS — R627 Adult failure to thrive: Secondary | ICD-10-CM | POA: Diagnosis not present

## 2019-08-05 DIAGNOSIS — M79605 Pain in left leg: Secondary | ICD-10-CM

## 2019-08-05 DIAGNOSIS — M7989 Other specified soft tissue disorders: Secondary | ICD-10-CM

## 2019-08-05 DIAGNOSIS — R339 Retention of urine, unspecified: Secondary | ICD-10-CM | POA: Diagnosis present

## 2019-08-05 DIAGNOSIS — Z6832 Body mass index (BMI) 32.0-32.9, adult: Secondary | ICD-10-CM

## 2019-08-05 DIAGNOSIS — I110 Hypertensive heart disease with heart failure: Principal | ICD-10-CM | POA: Diagnosis present

## 2019-08-05 DIAGNOSIS — E876 Hypokalemia: Secondary | ICD-10-CM | POA: Diagnosis not present

## 2019-08-05 DIAGNOSIS — K219 Gastro-esophageal reflux disease without esophagitis: Secondary | ICD-10-CM

## 2019-08-05 DIAGNOSIS — Z880 Allergy status to penicillin: Secondary | ICD-10-CM

## 2019-08-05 DIAGNOSIS — R7989 Other specified abnormal findings of blood chemistry: Secondary | ICD-10-CM

## 2019-08-05 DIAGNOSIS — Z86711 Personal history of pulmonary embolism: Secondary | ICD-10-CM | POA: Diagnosis present

## 2019-08-05 LAB — COMPREHENSIVE METABOLIC PANEL
ALT: 30 U/L (ref 0–44)
AST: 32 U/L (ref 15–41)
Albumin: 2.7 g/dL — ABNORMAL LOW (ref 3.5–5.0)
Alkaline Phosphatase: 63 U/L (ref 38–126)
Anion gap: 7 (ref 5–15)
BUN: 15 mg/dL (ref 8–23)
CO2: 25 mmol/L (ref 22–32)
Calcium: 8.6 mg/dL — ABNORMAL LOW (ref 8.9–10.3)
Chloride: 99 mmol/L (ref 98–111)
Creatinine, Ser: 0.49 mg/dL (ref 0.44–1.00)
GFR calc Af Amer: >60 mL/min (ref >60)
GFR calc non Af Amer: >60 mL/min (ref >60)
Glucose, Bld: 110 mg/dL — ABNORMAL HIGH (ref 70–99)
Potassium: 3.3 mmol/L — ABNORMAL LOW (ref 3.5–5.1)
Sodium: 131 mmol/L — ABNORMAL LOW (ref 135–145)
Total Bilirubin: 1.3 mg/dL — ABNORMAL HIGH (ref 0.3–1.2)
Total Protein: 5.7 g/dL — ABNORMAL LOW (ref 6.5–8.1)

## 2019-08-05 LAB — BRAIN NATRIURETIC PEPTIDE: B Natriuretic Peptide: 335 pg/mL — ABNORMAL HIGH (ref 0.0–100.0)

## 2019-08-05 LAB — CBC
HCT: 37.6 % (ref 36.0–46.0)
Hemoglobin: 12.6 g/dL (ref 12.0–15.0)
MCH: 31.6 pg (ref 26.0–34.0)
MCHC: 33.5 g/dL (ref 30.0–36.0)
MCV: 94.2 fL (ref 80.0–100.0)
Platelets: 206 10*3/uL (ref 150–400)
RBC: 3.99 MIL/uL (ref 3.87–5.11)
RDW: 14.6 % (ref 11.5–15.5)
WBC: 9.1 10*3/uL (ref 4.0–10.5)
nRBC: 0 % (ref 0.0–0.2)

## 2019-08-05 LAB — PROTIME-INR
INR: 0.9 (ref 0.8–1.2)
Prothrombin Time: 12 seconds (ref 11.4–15.2)

## 2019-08-05 MED ORDER — ENOXAPARIN SODIUM 40 MG/0.4ML ~~LOC~~ SOLN
40.0000 mg | SUBCUTANEOUS | Status: DC
  Administered 2019-08-05: 40 mg via SUBCUTANEOUS
  Filled 2019-08-05: qty 0.4

## 2019-08-05 MED ORDER — FUROSEMIDE 10 MG/ML IJ SOLN
20.0000 mg | Freq: Once | INTRAMUSCULAR | Status: AC
  Administered 2019-08-05: 20 mg via INTRAVENOUS
  Filled 2019-08-05: qty 2

## 2019-08-05 NOTE — ED Provider Notes (Signed)
[facility] Emergency Department Provider Note MRN:  [id number]  Arrival date & time: 08/05/19     Chief Complaint   Failure To Thrive   History of Present Illness   Melissa Lambert is a 85 y.o. year-old female with a history of protein C&S deficiency, pulmonary embolism, hypertension presenting to the ED with chief complaint of failure to thrive.  Patient explains that she was admitted to the [facility] for pneumonia and COVID-19, was discharged 1 week ago.  She explains that they wanted her to be discharged to a skilled nursing facility but she refused this and went home.  She lives alone.  She explains that she has been having trouble eating and drinking and caring for self at the past week.  Has noted some lower extremity edema for the past 2 days.  Denies any change to her breathing, no chest pain, no abdominal pain, no vomiting, no diarrhea.  Symptoms constant, mild, no exacerbating or alleviating factors.  Review of Systems  A complete 10 system review of systems was obtained and all systems are negative except as noted in the HPI and PMH.   Patient's Health History    Past Medical History:  Diagnosis Date  . Asthma   . Hypertension   . PE (pulmonary embolism)   . Protein C deficiency ([place])   . Protein S deficiency [facility])     Past Surgical History:  Procedure Laterality Date  . FEMUR IM NAIL Right 06/12/2014   Procedure: INTRAMEDULLARY (IM) NAIL FEMORAL;  Surgeon: Wylene Simmer, MD;  Location: [facility];  Service: Orthopedics;  Laterality: Right;    History reviewed. No pertinent family history.  Social History   Socioeconomic History  . Marital status: Single    Spouse name: Not on file  . Number of children: Not on file  . Years of education: Not on file  . Highest education level: Not on file  Occupational History  . Not on file  Tobacco Use  . Smoking status: Never Smoker  Substance and Sexual Activity  . Alcohol use: No  . Drug  use: No  . Sexual activity: Not on file  Other Topics Concern  . Not on file  Social History Narrative  . Not on file   Social Determinants of Health   Financial Resource Strain:   . Difficulty of Paying Living Expenses: Not on file  Food Insecurity:   . Worried About Charity fundraiser in the Last Year: Not on file  . Ran Out of Food in the Last Year: Not on file  Transportation Needs:   . Lack of Transportation (Medical): Not on file  . Lack of Transportation (Non-Medical): Not on file  Physical Activity:   . Days of Exercise per Week: Not on file  . Minutes of Exercise per Session: Not on file  Stress:   . Feeling of Stress : Not on file  Social Connections:   . Frequency of Communication with Friends and Family: Not on file  . Frequency of Social Gatherings with Friends and Family: Not on file  . Attends Religious Services: Not on file  . Active Member of Clubs or Organizations: Not on file  . Attends Archivist Meetings: Not on file  . Marital Status: Not on file  Intimate Partner Violence:   . Fear of Current or Ex-Partner: Not on file  . Emotionally Abused: Not on file  . Physically Abused: Not on file  . Sexually Abused: Not  on file     Physical Exam  Vital Signs and Nursing Notes reviewed Vitals:   08/05/19 1845 08/05/19 1900  BP:  (!) 144/73  Pulse: 74 72  Resp: 20 20  Temp:    SpO2: 98% 99%    CONSTITUTIONAL: Chronically ill-appearing, NAD NEURO:  Alert and oriented x 3, hard of hearing, no focal neurological deficits EYES:  eyes equal and reactive ENT/NECK:  no LAD, no JVD CARDIO: Regular rate, well-perfused, normal S1 and S2 PULM:  CTAB no wheezing or rhonchi GI/GU:  normal bowel sounds, non-distended, non-tender MSK/SPINE:  No gross deformities, 2+ pitting edema to bilateral lower extremities SKIN:  no rash, atraumatic PSYCH:  Appropriate speech and behavior  Diagnostic and Interventional Summary    EKG Interpretation  Date/Time:     Ventricular Rate:    PR Interval:    QRS Duration:   QT Interval:    QTC Calculation:   R Axis:     Text Interpretation:        Labs Reviewed  COMPREHENSIVE METABOLIC PANEL - Abnormal; Notable for the following components:      Result Value   Sodium 131 (*)    Potassium 3.3 (*)    Glucose, Bld 110 (*)    Calcium 8.6 (*)    Total Protein 5.7 (*)    Albumin 2.7 (*)    Total Bilirubin 1.3 (*)    All other components within normal limits  BRAIN NATRIURETIC PEPTIDE - Abnormal; Notable for the following components:   B Natriuretic Peptide 335.0 (*)    All other components within normal limits  SARS CORONAVIRUS 2 (TAT 6-24 HRS)  CBC  PROTIME-INR    US Venous Img Lower Bilateral  Final Result    DG Chest 1 View  Final Result      Medications  furosemide (LASIX) injection 20 mg (has no administration in time range)     Procedures  /  Critical Care Procedures  ED Course and Medical Decision Making  I have reviewed the triage vital signs and the nursing notes.  Pertinent labs & imaging results that were available during my care of the patient were reviewed by me and considered in my medical decision making (see below for details).     Failure to thrive, poor p.o. intake, new lower extremity edema in this 85 year old female with recent discharge from hospital for Covid pneumonia.  Considering CHF exacerbation, DVT given her history of VTE.  Awaiting labs, ultrasound study, x-ray.  Suspect we may not find any reason for admission, at which point would consult case management for SNF placement as patient is more amenable to this option.  Work-up revealing significantly improved x-ray, mildly elevated BNP, chronic hyponatremia.  Patient unable to ambulate, concerned that new Lasix prescription would worsen her already low sodium levels, will admit to hospital service for further care.  Barth Kirks. Sedonia Small, MD [place] [email or web address]  Final Clinical Impressions(s) / ED Diagnoses     ICD-10-CM   1. Peripheral edema  R60.9   2. Pneumonia  J18.9 DG Chest 1 View    DG Chest 1 View    CANCELED: XR Chest Single View    CANCELED: XR Chest Single View    ED Discharge Orders    None       Discharge Instructions Discussed with and Provided to Patient:   Discharge Instructions   None       Maudie Flakes, MD 08/05/19  1941  

## 2019-08-05 NOTE — H&P (Signed)
History and Physical  Melissa Lambert [id number] DOB: 01-30-1929 DOA: 08/05/2019  Referring physician: Gerlene Fee PCP: Monico Blitz, MD  Patient coming from: Home  Chief Complaint: Increased leg swelling and failure to thrive  HPI: Melissa Lambert is an 85 y.o. female with medical history significant for hypertension, pulmonary embolism, history of protein C and insufficiency who presents to the emergency department due to increased difficulty in being able to ambulate due to 2-3 days onset of leg swelling.  She states that she was recently admitted to [facility] due to pneumonia and COVID-19, she was discharged a week ago to a skilled nursing facility but she refused, so she went home and she lives alone.  Patient states that she ambulates with a cane and occasionally with a walker at baseline, but she has not been able to move around within last 2 days due to increased leg swelling.  Patient states that it has been more difficult to care for self since onset of her symptoms.  She denies chest pain, shortness of breath, fever, chills, nausea, vomiting, diarrhea.  ED Course:  In the emergency department, BP was 179/93, and other vital signs were within normal range.  Work-up in the ED showed normal CBC and BMP except for hyponatremia, hypoalbuminemia.  BNP was elevated at 335.  Bilateral lower extremity venous Doppler ultrasound of DVT in either lower extremity, but showed extensive soft tissue edema.  Chest x-ray showed near complete resolution of multifocal airspace and groundglass opacities since the most recent prior study consistent with resolving viral pneumonia.  IV Lasix 20 Mg x1 was given.  Hospitalist was asked to admit patient for further evaluation and management.  Review of Systems: Constitutional: Negative for chills and fever.  HENT: Negative for ear pain and sore throat.   Eyes: Negative for pain and visual disturbance.  Respiratory: Negative for cough, chest  tightness and shortness of breath.   Cardiovascular: Positive for bilateral lower extremity edema.  Negative for chest pain and palpitations.  Gastrointestinal: Negative for abdominal pain and vomiting.  Endocrine: Negative for polyphagia and polyuria.  Genitourinary: Negative for decreased urine volume, dysuria, enuresis Musculoskeletal: Negative for arthralgias and back pain.  Skin: Negative for color change and rash.  Allergic/Immunologic: Negative for immunocompromised state.  Neurological: Negative for tremors, syncope, speech difficulty, weakness, light-headedness and headaches.  Review of systems as noted in the HPI. All other systems reviewed and are negative.   Past Medical History:  Diagnosis Date  . Asthma   . Hypertension   . PE (pulmonary embolism)   . Protein C deficiency ([place])   . Protein S deficiency [facility])    Past Surgical History:  Procedure Laterality Date  . FEMUR IM NAIL Right 06/12/2014   Procedure: INTRAMEDULLARY (IM) NAIL FEMORAL;  Surgeon: Wylene Simmer, MD;  Location: [facility];  Service: Orthopedics;  Laterality: Right;    Social History:  reports that she has never smoked. She does not have any smokeless tobacco history on file. She reports that she does not drink alcohol or use drugs.   Allergies  Allergen Reactions  . Adhesive [Tape] Itching  . Penicillins Itching, Swelling and Rash    Streptomycin also  . Tuberculin Tests Itching, Swelling and Rash    History reviewed. No pertinent family history.    Prior to Admission medications   Medication Sig Start Date End Date Taking? Authorizing Provider  Calcium Carbonate-Vit D-Min (CALTRATE 600+D PLUS MINERALS) 600-800 MG-UNIT TABS Take 1 tablet by mouth 2 (two) times  daily.    [provider]  cetirizine (ZYRTEC) 10 MG tablet Take 10 mg by mouth daily.    [provider]  HYDROcodone-acetaminophen (NORCO/VICODIN) 5-325 MG per tablet Take 1-2 tablets by mouth every 6 (six) hours as needed  for moderate pain. 06/30/14   Angiulli, Lavon Paganini, PA-C  levofloxacin (LEVAQUIN) 500 MG tablet Take 1 tablet (500 mg total) by mouth daily. 06/30/14   Angiulli, Lavon Paganini, PA-C  omeprazole (PRILOSEC OTC) 20 MG tablet Take 1 tablet (20 mg total) by mouth daily. 06/30/14   Angiulli, Lavon Paganini, PA-C  oxybutynin (DITROPAN-XL) 5 MG 24 hr tablet Take 1 tablet (5 mg total) by mouth daily. 06/30/14   Angiulli, Lavon Paganini, PA-C  senna (SENOKOT) 8.6 MG TABS tablet Take 2 tablets (17.2 mg total) by mouth 2 (two) times daily. 06/15/14   Ghimire, Henreitta Leber, MD  warfarin (COUMADIN) 5 MG tablet Take 1 tablet (5 mg total) by mouth daily at 6 PM. 06/30/14   Angiulli, Lavon Paganini, PA-C    Physical Exam: BP (!) 159/83   Pulse 68   Temp 98.2 F (36.8 C) (Oral)   Resp 20   Ht 5\' 4"  (1.626 m)   Wt 92.3 kg   SpO2 97%   BMI 34.93 kg/m   . General: 85 y.o. year-old female ill appearing but in no acute distress.  Alert and oriented x3.  Marland Kitchen HEENT: Normocephalic, atraumatic . Neck: Supple, trachea medial . Cardiovascular: Regular rate and rhythm with no rubs or gallops.  No thyromegaly or JVD noted.  No lower extremity edema. 2/4 pulses in all 4 extremities. Marland Kitchen Respiratory: Clear to auscultation with no wheezes or rales. Good inspiratory effort. . Abdomen: Soft nontender nondistended with normal bowel sounds x4 quadrants. . Muskuloskeletal: No cyanosis, +2 edema noted bilaterally . Neuro: CN II-XII intact, strength, sensation, reflexes . Skin: No ulcerative lesions noted or rashes . Psychiatry: Judgement and insight appear normal. Mood is appropriate for condition and setting          Labs on Admission:  Basic Metabolic Panel: Recent Labs  Lab 08/05/19 1736  NA 131*  K 3.3*  CL 99  CO2 25  GLUCOSE 110*  BUN 15  CREATININE 0.49  CALCIUM 8.6*   Liver Function Tests: Recent Labs  Lab 08/05/19 1736  AST 32  ALT 30  ALKPHOS 63  BILITOT 1.3*  PROT 5.7*  ALBUMIN 2.7*   No results for input(s): LIPASE,  AMYLASE in the last 168 hours. No results for input(s): AMMONIA in the last 168 hours. CBC: Recent Labs  Lab 08/05/19 1736  WBC 9.1  HGB 12.6  HCT 37.6  MCV 94.2  PLT 206   Cardiac Enzymes: No results for input(s): CKTOTAL, CKMB, CKMBINDEX, TROPONINI in the last 168 hours.  BNP (last 3 results) Recent Labs    08/05/19 1737  BNP 335.0*    ProBNP (last 3 results) No results for input(s): PROBNP in the last 8760 hours.  CBG: No results for input(s): GLUCAP in the last 168 hours.  Radiological Exams on Admission: DG Chest 1 View  Result Date: 08/05/2019 CLINICAL DATA:  Failure to thrive. Recently hospitalized for reported COVID-19 pneumonia. EXAM: CHEST  1 VIEW COMPARISON:  Radiographs and CT 07/22/2019. Radiographs 08/20/2017. FINDINGS: 1734 hours the heart size and mediastinal contours are stable with aortic atherosclerosis. The multifocal airspace and ground-glass opacity seen in the lungs on the most recent prior study have nearly resolved. There are residual multifocal densities which appear largely  chronic, similar to the 08/20/2017 study. There is no pleural effusion or pneumothorax. Patient is status post left shoulder arthroplasty. Moderate glenohumeral degenerative changes are present on the right. There are old left-sided rib fractures. IMPRESSION: 1. Near complete resolution of multifocal airspace and ground-glass opacities since the most recent prior study consistent with resolving viral pneumonia. 2. Remaining pulmonary parenchymal densities appear chronic. No new findings. Electronically Signed   By: Richardean Sale M.D.   On: 08/05/2019 18:11   US Venous Img Lower Bilateral  Result Date: 08/05/2019 CLINICAL DATA:  Lower extremity swelling. EXAM: BILATERAL LOWER EXTREMITY VENOUS DOPPLER ULTRASOUND TECHNIQUE: Gray-scale sonography with graded compression, as well as color Doppler and duplex ultrasound were performed to evaluate the lower extremity deep venous systems  from the level of the common femoral vein and including the common femoral, femoral, profunda femoral, popliteal and calf veins including the posterior tibial, peroneal and gastrocnemius veins when visible. The superficial great saphenous vein was also interrogated. Spectral Doppler was utilized to evaluate flow at rest and with distal augmentation maneuvers in the common femoral, femoral and popliteal veins. COMPARISON:  None. FINDINGS: RIGHT LOWER EXTREMITY Common Femoral Vein: No evidence of thrombus. Normal compressibility, respiratory phasicity and response to augmentation. Saphenofemoral Junction: No evidence of thrombus. Normal compressibility and flow on color Doppler imaging. Profunda Femoral Vein: No evidence of thrombus. Normal compressibility and flow on color Doppler imaging. Femoral Vein: No evidence of thrombus. Normal compressibility, respiratory phasicity and response to augmentation. Popliteal Vein: No evidence of thrombus. Normal compressibility, respiratory phasicity and response to augmentation. Calf Veins: No evidence of thrombus. Normal compressibility and flow on color Doppler imaging. Other Findings:  Extensive soft tissue edema. LEFT LOWER EXTREMITY Common Femoral Vein: No evidence of thrombus. Normal compressibility, respiratory phasicity and response to augmentation. Saphenofemoral Junction: No evidence of thrombus. Normal compressibility and flow on color Doppler imaging. Profunda Femoral Vein: No evidence of thrombus. Normal compressibility and flow on color Doppler imaging. Femoral Vein: No evidence of thrombus. Normal compressibility, respiratory phasicity and response to augmentation. Popliteal Vein: No evidence of thrombus. Normal compressibility, respiratory phasicity and response to augmentation. Calf Veins: No evidence of thrombus. Normal compressibility and flow on color Doppler imaging. Other Findings:  Extensive soft tissue edema IMPRESSION: No evidence of deep venous  thrombosis in either lower extremity. Extensive soft tissue edema. Electronically Signed   By: Misty Stanley M.D.   On: 08/05/2019 18:43    EKG: I independently viewed the EKG done and my findings are as followed: Normal sinus rhythm at a rate of 75bpm with multiple PVCs  Assessment/Plan Present on Admission: . Adult failure to thrive . History of pulmonary embolus (PE) . Essential hypertension . Protein C deficiency ([place])  Principal Problem:   Adult failure to thrive Active Problems:   History of pulmonary embolus (PE)   Essential hypertension   Protein C deficiency (HCC)   Elevated brain natriuretic peptide (BNP) level   GERD (gastroesophageal reflux disease)   Hypoalbuminemia   Hyponatremia   Adult failure to thrive Continue fall precaution and neuro checks Continue PT eval and treat Dietary will be consulted  Elevated proBNP r/o CHF proBNP 335; patient also complained of 2-3 days onset of bilateral leg swelling with increased difficulty in being able to ambulate. She was noted to have bilateral lower extremity edema on physical exam Bilateral lower extremity ultrasound ruled out DVT Continue total input/output, daily weights and fluid restriction Continue IV Lasix 20 mg twice daily Continue Cardiac diet  EKG and Echocardiogram in the morning   Hyponatremia (chronic) Sodium 131, stable Continue to monitor sodium level with morning labs  Hypoalbuminemia possible secondary to protein calorie malnutrition Protein supplement will be provided Dietary will be consulted  History of pulmonary embolus History of Protein C deficiency  Patient on warfarin per med rec, current INR 0.9, continue to monitor daily INR  Essential hypertension Home amlodipine  GERD Continue PPI  DVT prophylaxis: Lovenox  Code Status: Full code  Family Communication: None at bedside  Disposition Plan: Possible to SNF once clinically stable  Consults called: None  Admission status:  Observation    Bernadette Hoit MD [facility]  If 7PM-7AM, please contact night-coverage [email or web address]  08/06/2019, 12:21 AM

## 2019-08-05 NOTE — ED Triage Notes (Signed)
Pt was recently discharged from [facility] for pneumonia. Has not been eating or drinking well and has not been getting up of her chair. SNF was recommended on her d/c and she refused.

## 2019-08-06 DIAGNOSIS — J45909 Unspecified asthma, uncomplicated: Secondary | ICD-10-CM | POA: Diagnosis present

## 2019-08-06 DIAGNOSIS — I361 Nonrheumatic tricuspid (valve) insufficiency: Secondary | ICD-10-CM | POA: Diagnosis not present

## 2019-08-06 DIAGNOSIS — R7989 Other specified abnormal findings of blood chemistry: Secondary | ICD-10-CM | POA: Diagnosis not present

## 2019-08-06 DIAGNOSIS — Z20828 Contact with and (suspected) exposure to other viral communicable diseases: Secondary | ICD-10-CM | POA: Diagnosis present

## 2019-08-06 DIAGNOSIS — I5033 Acute on chronic diastolic (congestive) heart failure: Secondary | ICD-10-CM | POA: Diagnosis present

## 2019-08-06 DIAGNOSIS — E46 Unspecified protein-calorie malnutrition: Secondary | ICD-10-CM | POA: Diagnosis present

## 2019-08-06 DIAGNOSIS — Z888 Allergy status to other drugs, medicaments and biological substances status: Secondary | ICD-10-CM | POA: Diagnosis not present

## 2019-08-06 DIAGNOSIS — R531 Weakness: Secondary | ICD-10-CM

## 2019-08-06 DIAGNOSIS — E871 Hypo-osmolality and hyponatremia: Secondary | ICD-10-CM | POA: Diagnosis present

## 2019-08-06 DIAGNOSIS — R41841 Cognitive communication deficit: Secondary | ICD-10-CM | POA: Diagnosis present

## 2019-08-06 DIAGNOSIS — D6859 Other primary thrombophilia: Secondary | ICD-10-CM | POA: Diagnosis present

## 2019-08-06 DIAGNOSIS — M6281 Muscle weakness (generalized): Secondary | ICD-10-CM | POA: Diagnosis present

## 2019-08-06 DIAGNOSIS — Z91048 Other nonmedicinal substance allergy status: Secondary | ICD-10-CM | POA: Diagnosis not present

## 2019-08-06 DIAGNOSIS — Z880 Allergy status to penicillin: Secondary | ICD-10-CM | POA: Diagnosis not present

## 2019-08-06 DIAGNOSIS — R627 Adult failure to thrive: Secondary | ICD-10-CM | POA: Diagnosis present

## 2019-08-06 DIAGNOSIS — Z6832 Body mass index (BMI) 32.0-32.9, adult: Secondary | ICD-10-CM | POA: Diagnosis not present

## 2019-08-06 DIAGNOSIS — K219 Gastro-esophageal reflux disease without esophagitis: Secondary | ICD-10-CM | POA: Diagnosis present

## 2019-08-06 DIAGNOSIS — L89102 Pressure ulcer of unspecified part of back, stage 2: Secondary | ICD-10-CM | POA: Diagnosis present

## 2019-08-06 DIAGNOSIS — I10 Essential (primary) hypertension: Secondary | ICD-10-CM

## 2019-08-06 DIAGNOSIS — E876 Hypokalemia: Secondary | ICD-10-CM | POA: Diagnosis not present

## 2019-08-06 DIAGNOSIS — E8809 Other disorders of plasma-protein metabolism, not elsewhere classified: Secondary | ICD-10-CM | POA: Diagnosis present

## 2019-08-06 DIAGNOSIS — R600 Localized edema: Secondary | ICD-10-CM | POA: Diagnosis present

## 2019-08-06 DIAGNOSIS — Z7901 Long term (current) use of anticoagulants: Secondary | ICD-10-CM | POA: Diagnosis not present

## 2019-08-06 DIAGNOSIS — Z86711 Personal history of pulmonary embolism: Secondary | ICD-10-CM | POA: Diagnosis not present

## 2019-08-06 DIAGNOSIS — R2681 Unsteadiness on feet: Secondary | ICD-10-CM | POA: Diagnosis present

## 2019-08-06 DIAGNOSIS — L8990 Pressure ulcer of unspecified site, unspecified stage: Secondary | ICD-10-CM | POA: Diagnosis present

## 2019-08-06 DIAGNOSIS — R339 Retention of urine, unspecified: Secondary | ICD-10-CM | POA: Diagnosis not present

## 2019-08-06 DIAGNOSIS — I110 Hypertensive heart disease with heart failure: Secondary | ICD-10-CM | POA: Diagnosis present

## 2019-08-06 LAB — CBC
HCT: 36.1 % (ref 36.0–46.0)
Hemoglobin: 12 g/dL (ref 12.0–15.0)
MCH: 32 pg (ref 26.0–34.0)
MCHC: 33.2 g/dL (ref 30.0–36.0)
MCV: 96.3 fL (ref 80.0–100.0)
Platelets: 186 10*3/uL (ref 150–400)
RBC: 3.75 MIL/uL — ABNORMAL LOW (ref 3.87–5.11)
RDW: 14.8 % (ref 11.5–15.5)
WBC: 7.9 10*3/uL (ref 4.0–10.5)
nRBC: 0 % (ref 0.0–0.2)

## 2019-08-06 LAB — COMPREHENSIVE METABOLIC PANEL
ALT: 23 U/L (ref 0–44)
AST: 26 U/L (ref 15–41)
Albumin: 2.4 g/dL — ABNORMAL LOW (ref 3.5–5.0)
Alkaline Phosphatase: 56 U/L (ref 38–126)
Anion gap: 9 (ref 5–15)
BUN: 14 mg/dL (ref 8–23)
CO2: 25 mmol/L (ref 22–32)
Calcium: 8.5 mg/dL — ABNORMAL LOW (ref 8.9–10.3)
Chloride: 100 mmol/L (ref 98–111)
Creatinine, Ser: 0.48 mg/dL (ref 0.44–1.00)
GFR calc Af Amer: >60 mL/min (ref >60)
GFR calc non Af Amer: >60 mL/min (ref >60)
Glucose, Bld: 78 mg/dL (ref 70–99)
Potassium: 3.5 mmol/L (ref 3.5–5.1)
Sodium: 134 mmol/L — ABNORMAL LOW (ref 135–145)
Total Bilirubin: 1.2 mg/dL (ref 0.3–1.2)
Total Protein: 5 g/dL — ABNORMAL LOW (ref 6.5–8.1)

## 2019-08-06 LAB — PHOSPHORUS: Phosphorus: 3.6 mg/dL (ref 2.5–4.6)

## 2019-08-06 LAB — PROTIME-INR
INR: 1.1 (ref 0.8–1.2)
Prothrombin Time: 13.7 seconds (ref 11.4–15.2)

## 2019-08-06 LAB — MAGNESIUM: Magnesium: 1.8 mg/dL (ref 1.7–2.4)

## 2019-08-06 LAB — SARS CORONAVIRUS 2 (TAT 6-24 HRS): SARS Coronavirus 2: NEGATIVE

## 2019-08-06 LAB — APTT: aPTT: 31 seconds (ref 24–36)

## 2019-08-06 MED ORDER — PANTOPRAZOLE SODIUM 40 MG PO TBEC
40.0000 mg | DELAYED_RELEASE_TABLET | Freq: Every day | ORAL | Status: DC
  Administered 2019-08-06 – 2019-08-12 (×7): 40 mg via ORAL
  Filled 2019-08-06 (×7): qty 1

## 2019-08-06 MED ORDER — POTASSIUM CHLORIDE CRYS ER 10 MEQ PO TBCR
10.0000 meq | EXTENDED_RELEASE_TABLET | Freq: Three times a day (TID) | ORAL | Status: DC
  Administered 2019-08-06 – 2019-08-09 (×9): 10 meq via ORAL
  Filled 2019-08-06 (×10): qty 1

## 2019-08-06 MED ORDER — HYDROCODONE-ACETAMINOPHEN 5-325 MG PO TABS: 1.0000 | ORAL_TABLET | Freq: Four times a day (QID) | ORAL | Status: DC | PRN

## 2019-08-06 MED ORDER — WARFARIN SODIUM 5 MG PO TABS
5.0000 mg | ORAL_TABLET | Freq: Once | ORAL | Status: AC
  Administered 2019-08-06: 5 mg via ORAL
  Filled 2019-08-06: qty 1

## 2019-08-06 MED ORDER — ACETAMINOPHEN 325 MG PO TABS
650.0000 mg | ORAL_TABLET | Freq: Four times a day (QID) | ORAL | Status: DC | PRN
  Administered 2019-08-06 – 2019-08-09 (×5): 650 mg via ORAL
  Filled 2019-08-06 (×5): qty 2

## 2019-08-06 MED ORDER — ENOXAPARIN SODIUM 100 MG/ML ~~LOC~~ SOLN
90.0000 mg | Freq: Two times a day (BID) | SUBCUTANEOUS | Status: DC
  Administered 2019-08-06 – 2019-08-09 (×7): 90 mg via SUBCUTANEOUS
  Filled 2019-08-06 (×7): qty 1

## 2019-08-06 MED ORDER — AMLODIPINE BESYLATE 5 MG PO TABS
7.5000 mg | ORAL_TABLET | Freq: Every day | ORAL | Status: DC
  Administered 2019-08-06: 7.5 mg via ORAL
  Filled 2019-08-06: qty 2

## 2019-08-06 MED ORDER — OMEPRAZOLE MAGNESIUM 20 MG PO TBEC: 20.0000 mg | DELAYED_RELEASE_TABLET | Freq: Every day | ORAL | Status: DC

## 2019-08-06 MED ORDER — OXYBUTYNIN CHLORIDE ER 5 MG PO TB24
5.0000 mg | ORAL_TABLET | Freq: Every day | ORAL | Status: DC
  Administered 2019-08-06 – 2019-08-12 (×7): 5 mg via ORAL
  Filled 2019-08-06 (×10): qty 1

## 2019-08-06 MED ORDER — FUROSEMIDE 10 MG/ML IJ SOLN
INTRAMUSCULAR | Status: AC
  Administered 2019-08-06: 20 mg via INTRAVENOUS
  Filled 2019-08-06: qty 4

## 2019-08-06 MED ORDER — SENNA 8.6 MG PO TABS
2.0000 | ORAL_TABLET | Freq: Two times a day (BID) | ORAL | Status: DC
  Administered 2019-08-06 – 2019-08-11 (×10): 17.2 mg via ORAL
  Filled 2019-08-06 (×17): qty 2

## 2019-08-06 MED ORDER — WARFARIN - PHARMACIST DOSING INPATIENT: Freq: Every day | Status: DC

## 2019-08-06 MED ORDER — AMLODIPINE BESYLATE 5 MG PO TABS
5.0000 mg | ORAL_TABLET | Freq: Every day | ORAL | Status: DC
  Administered 2019-08-07 – 2019-08-12 (×6): 5 mg via ORAL
  Filled 2019-08-06 (×6): qty 1

## 2019-08-06 MED ORDER — WARFARIN SODIUM 7.5 MG PO TABS
7.5000 mg | ORAL_TABLET | ORAL | Status: AC
  Administered 2019-08-06: 7.5 mg via ORAL
  Filled 2019-08-06 (×2): qty 1

## 2019-08-06 MED ORDER — FUROSEMIDE 10 MG/ML IJ SOLN
20.0000 mg | Freq: Two times a day (BID) | INTRAMUSCULAR | Status: DC
  Administered 2019-08-06 – 2019-08-12 (×13): 20 mg via INTRAVENOUS
  Filled 2019-08-06 (×12): qty 2

## 2019-08-06 MED ORDER — ENSURE ENLIVE PO LIQD
237.0000 mL | Freq: Two times a day (BID) | ORAL | Status: DC
  Administered 2019-08-07 – 2019-08-11 (×3): 237 mL via ORAL
  Filled 2019-08-06 (×8): qty 237

## 2019-08-06 NOTE — Progress Notes (Signed)
PROGRESS NOTE [facility]   Melissa Lambert  [id number]  DOB: [birth year]  DOA: 08/05/2019 PCP: Monico Blitz, MD   Brief Admission Hx: 85 y.o. female with medical history significant for hypertension, pulmonary embolism, history of protein C and insufficiency who presents to the emergency department due to increased difficulty in being able to ambulate due to 2-3 days onset of leg swelling.  She states that she was recently admitted to [facility] due to pneumonia and COVID-19  MDM/Assessment & Plan:   1. Bilateral lower extremity edema-ultrasound negative for DVT, suspect caused by congestive heart failure.  She has an elevated BNP.  She has been started on IV Lasix.  Continue to monitor add TED hose.  Follow-up echocardiogram.  Low-sodium diet. 2. Chronic hyponatremia-sodium is stable from recent testing continue to monitor closely in the setting of IV diuresis. 3. Hypoalbuminemia-protein supplement ordered dietitian consultation. 4. Generalized weakness-patient unable to ambulate, PT evaluation requested likely will need SNF placement 5. History of pulmonary embolus and protein C deficiency she is chronically anticoagulated with warfarin but has not been taking consistently and is subtherapeutic and now she is on heparin pending therapeutic warfarin level. 6. Essential hypertension-she is on amlodipine 7.5 mg daily however I wonder if that should be changed given her lower extremity edema.  Amlodipine and these higher doses tends to contribute to the leg edema. 7. GERD-PPI  DVT prophylaxis: Lovenox Code Status: Full code Family Communication: None Disposition Plan: SNF   Consultants:    Procedures:    Antimicrobials:     Subjective: Patient reports she is not able to walk.  She is very weak.  She has no chest pain or shortness of breath.  Objective: Vitals:   08/06/19 1100 08/06/19 1130 08/06/19 1230 08/06/19 1330  BP: (!) 148/72 (!) 91/46 (!)  91/54 (!) 103/50  Pulse: 93 80 78 89  Resp: (!) 22 19 19 20   Temp:      TempSrc:      SpO2: 96% 94% 95% 91%  Weight:      Height:        Intake/Output Summary (Last 24 hours) at 08/06/2019 1420 Last data filed at 08/05/2019 2043 Gross per 24 hour  Intake -  Output 700 ml  Net -700 ml   Filed Weights   08/05/19 1648  Weight: 92.3 kg   REVIEW OF SYSTEMS  As per history otherwise all reviewed and reported negative  Exam:  General exam: Elderly female chronically ill-appearing female sitting up in bed eating breakfast. Respiratory system: Shallow breathing. No increased work of breathing. Cardiovascular system: S1 & S2 heard. No JVD, murmurs, gallops, clicks or pedal edema. Gastrointestinal system: Abdomen is nondistended, soft and nontender. Normal bowel sounds heard. Central nervous system: Alert and oriented. No focal neurological deficits. Extremities: 2+ pitting edema bilateral lower extremities.  Data Reviewed: Basic Metabolic Panel: Recent Labs  Lab 08/05/19 1736 08/06/19 0707  NA 131* 134*  K 3.3* 3.5  CL 99 100  CO2 25 25  GLUCOSE 110* 78  BUN 15 14  CREATININE 0.49 0.48  CALCIUM 8.6* 8.5*  MG  --  1.8  PHOS  --  3.6   Liver Function Tests: Recent Labs  Lab 08/05/19 1736 08/06/19 0707  AST 32 26  ALT 30 23  ALKPHOS 63 56  BILITOT 1.3* 1.2  PROT 5.7* 5.0*  ALBUMIN 2.7* 2.4*   No results for input(s): LIPASE, AMYLASE in the last 168 hours. No results for input(s): AMMONIA in  the last 168 hours. CBC: Recent Labs  Lab 08/05/19 1736 08/06/19 0707  WBC 9.1 7.9  HGB 12.6 12.0  HCT 37.6 36.1  MCV 94.2 96.3  PLT 206 186   Cardiac Enzymes: No results for input(s): CKTOTAL, CKMB, CKMBINDEX, TROPONINI in the last 168 hours. CBG (last 3)  No results for input(s): GLUCAP in the last 72 hours. No results found for this or any previous visit (from the past 240 hour(s)).   Studies: DG Chest 1 View  Result Date: 08/05/2019 CLINICAL DATA:  Failure  to thrive. Recently hospitalized for reported COVID-19 pneumonia. EXAM: CHEST  1 VIEW COMPARISON:  Radiographs and CT 07/22/2019. Radiographs 08/20/2017. FINDINGS: 1734 hours the heart size and mediastinal contours are stable with aortic atherosclerosis. The multifocal airspace and ground-glass opacity seen in the lungs on the most recent prior study have nearly resolved. There are residual multifocal densities which appear largely chronic, similar to the 08/20/2017 study. There is no pleural effusion or pneumothorax. Patient is status post left shoulder arthroplasty. Moderate glenohumeral degenerative changes are present on the right. There are old left-sided rib fractures. IMPRESSION: 1. Near complete resolution of multifocal airspace and ground-glass opacities since the most recent prior study consistent with resolving viral pneumonia. 2. Remaining pulmonary parenchymal densities appear chronic. No new findings. Electronically Signed   By: Richardean Sale M.D.   On: 08/05/2019 18:11   US Venous Img Lower Bilateral  Result Date: 08/05/2019 CLINICAL DATA:  Lower extremity swelling. EXAM: BILATERAL LOWER EXTREMITY VENOUS DOPPLER ULTRASOUND TECHNIQUE: Gray-scale sonography with graded compression, as well as color Doppler and duplex ultrasound were performed to evaluate the lower extremity deep venous systems from the level of the common femoral vein and including the common femoral, femoral, profunda femoral, popliteal and calf veins including the posterior tibial, peroneal and gastrocnemius veins when visible. The superficial great saphenous vein was also interrogated. Spectral Doppler was utilized to evaluate flow at rest and with distal augmentation maneuvers in the common femoral, femoral and popliteal veins. COMPARISON:  None. FINDINGS: RIGHT LOWER EXTREMITY Common Femoral Vein: No evidence of thrombus. Normal compressibility, respiratory phasicity and response to augmentation. Saphenofemoral Junction: No  evidence of thrombus. Normal compressibility and flow on color Doppler imaging. Profunda Femoral Vein: No evidence of thrombus. Normal compressibility and flow on color Doppler imaging. Femoral Vein: No evidence of thrombus. Normal compressibility, respiratory phasicity and response to augmentation. Popliteal Vein: No evidence of thrombus. Normal compressibility, respiratory phasicity and response to augmentation. Calf Veins: No evidence of thrombus. Normal compressibility and flow on color Doppler imaging. Other Findings:  Extensive soft tissue edema. LEFT LOWER EXTREMITY Common Femoral Vein: No evidence of thrombus. Normal compressibility, respiratory phasicity and response to augmentation. Saphenofemoral Junction: No evidence of thrombus. Normal compressibility and flow on color Doppler imaging. Profunda Femoral Vein: No evidence of thrombus. Normal compressibility and flow on color Doppler imaging. Femoral Vein: No evidence of thrombus. Normal compressibility, respiratory phasicity and response to augmentation. Popliteal Vein: No evidence of thrombus. Normal compressibility, respiratory phasicity and response to augmentation. Calf Veins: No evidence of thrombus. Normal compressibility and flow on color Doppler imaging. Other Findings:  Extensive soft tissue edema IMPRESSION: No evidence of deep venous thrombosis in either lower extremity. Extensive soft tissue edema. Electronically Signed   By: Misty Stanley M.D.   On: 08/05/2019 18:43     Scheduled Meds: . amLODipine  7.5 mg Oral Daily  . enoxaparin (LOVENOX) injection  90 mg Subcutaneous Q12H  . feeding supplement (ENSURE ENLIVE)  237 mL Oral BID BM  . furosemide  20 mg Intravenous BID  . oxybutynin  5 mg Oral Daily  . pantoprazole  40 mg Oral Daily  . senna  2 tablet Oral BID  . warfarin  5 mg Oral Once  . Warfarin - Pharmacist Dosing Inpatient   Does not apply q1800   Continuous Infusions:  Principal Problem:   Adult failure to thrive  Active Problems:   History of pulmonary embolus (PE)   Essential hypertension   Protein C deficiency (HCC)   Elevated brain natriuretic peptide (BNP) level   GERD (gastroesophageal reflux disease)   Hypoalbuminemia   Hyponatremia   Generalized weakness   Time spent:   Irwin Brakeman, MD [facility] 08/06/2019, 2:20 PM    LOS: 0 days  How to contact the [facility] Attending or Consulting provider [place] or covering provider during after hours [place], for this patient?  1. Check the care team in [facility] and look for a) attending/consulting [facility] provider listed and b) the [facility] team listed 2. Log into [email or web address] and use [place]'s universal password to access. If you do not have the password, please contact the hospital operator. 3. Locate the [facility] provider you are looking for under [facility] and page to a number that you can be directly reached. 4. If you still have difficulty reaching the provider, please page the [facility] (Director on Call) for the Hospitalists listed on amion for assistance.

## 2019-08-06 NOTE — ED Notes (Signed)
Have given pt a meal tray  

## 2019-08-06 NOTE — ED Notes (Signed)
AC contacted for 2200 meds.

## 2019-08-06 NOTE — ED Notes (Signed)
Have notified Dr. Wynetta Emery of low BP.

## 2019-08-06 NOTE — Progress Notes (Signed)
ANTICOAGULATION CONSULT NOTE - Initial Consult  Pharmacy Consult for Coumadin/Lovenox Indication: h/o PE, protein C&S deficiency  Allergies  Allergen Reactions  . Adhesive [Tape] Itching  . Penicillins Itching, Swelling and Rash    Streptomycin also  . Tuberculin Tests Itching, Swelling and Rash    Patient Measurements: Height: 5\' 4"  (162.6 cm) Weight: 203 lb 7.8 oz (92.3 kg) IBW/kg (Calculated) : 54.7  Vital Signs: Temp: 98.2 F (36.8 C) (12/24 1728) Temp Source: Oral (12/24 1728) BP: 159/83 (12/24 2300) Pulse Rate: 68 (12/24 2300)  Labs: Recent Labs    08/05/19 1736  HGB 12.6  HCT 37.6  PLT 206  LABPROT 12.0  INR 0.9  CREATININE 0.49    Estimated Creatinine Clearance: 53.5 mL/min (by C-G formula based on SCr of 0.49 mg/dL).   Medical History: Past Medical History:  Diagnosis Date  . Asthma   . Hypertension   . PE (pulmonary embolism)   . Protein C deficiency ([place])   . Protein S deficiency ([place])     Medications:  Awaiting electronic med rec  Assessment: 85 y.o. F presents with failure to thrive. Pt on coumadin PTA for h/o PE, protein C & S deficiency. Admission INR 0.9 (subtherapeutic). Discussed with Dr. Josephine Cables and he would like to add full-dose Lovenox until INR >/= 2. Awaiting clarification of home dose - pt unsure exact dose - thinks 5-10mg /day.   Goal of Therapy:  INR 2-3 Monitor platelets by anticoagulation protocol: Yes   Plan:  Daily INR Coumadin 7.5mg  now Lovenox 90mg  SQ q12h (until INR >/=2) CBC q72h while on Lovenox  Sherlon Handing, PharmD, BCPS Please see amion for complete clinical pharmacist phone list 08/06/2019,12:47 AM

## 2019-08-06 NOTE — Progress Notes (Signed)
ANTICOAGULATION CONSULT NOTE -  Pharmacy Consult for Coumadin/Lovenox Indication: h/o PE, protein C&S deficiency  Allergies  Allergen Reactions  . Adhesive [Tape] Itching  . Penicillins Itching, Swelling and Rash    Streptomycin also  . Tuberculin Tests Itching, Swelling and Rash    Patient Measurements: Height: 5\' 4"  (162.6 cm) Weight: 203 lb 7.8 oz (92.3 kg) IBW/kg (Calculated) : 54.7  Vital Signs: BP: 158/78 (12/25 0800) Pulse Rate: 84 (12/25 0800)  Labs: Recent Labs    08/05/19 1736 08/06/19 0707  HGB 12.6 12.0  HCT 37.6 36.1  PLT 206 186  APTT  --  31  LABPROT 12.0 13.7  INR 0.9 1.1  CREATININE 0.49 0.48    Estimated Creatinine Clearance: 53.5 mL/min (by C-G formula based on SCr of 0.48 mg/dL).   Medical History: Past Medical History:  Diagnosis Date  . Asthma   . Hypertension   . PE (pulmonary embolism)   . Protein C deficiency ([place])   . Protein S deficiency ([place])     Medications:  Awaiting electronic med rec  Assessment: 85 y.o. F presents with failure to thrive. Pt on coumadin PTA for h/o PE, protein C & S deficiency. Admission INR 0.9 (subtherapeutic). Discussed with Dr. Josephine Cables and he would like to add full-dose Lovenox until INR >/= 2.  INR 1.1 Home dose is 5mg /day.   Goal of Therapy:  INR 2-3 Monitor platelets by anticoagulation protocol: Yes   Plan:  Coumadin 5mg  po x 1 today Daily INR Lovenox 90mg  SQ q12h (until INR >/=2) CBC q72h while on Lovenox  Isac Sarna, BS Pharm D, BCPS Clinical Pharmacist Pager [phone] 08/06/2019,8:50 AM

## 2019-08-07 ENCOUNTER — Encounter (HOSPITAL_COMMUNITY): Payer: Self-pay | Admitting: Internal Medicine

## 2019-08-07 ENCOUNTER — Other Ambulatory Visit: Payer: Self-pay

## 2019-08-07 DIAGNOSIS — L8990 Pressure ulcer of unspecified site, unspecified stage: Secondary | ICD-10-CM | POA: Insufficient documentation

## 2019-08-07 LAB — MAGNESIUM: Magnesium: 1.6 mg/dL — ABNORMAL LOW (ref 1.7–2.4)

## 2019-08-07 LAB — CBC
HCT: 35.3 % — ABNORMAL LOW (ref 36.0–46.0)
Hemoglobin: 11.5 g/dL — ABNORMAL LOW (ref 12.0–15.0)
MCH: 31.1 pg (ref 26.0–34.0)
MCHC: 32.6 g/dL (ref 30.0–36.0)
MCV: 95.4 fL (ref 80.0–100.0)
Platelets: 194 10*3/uL (ref 150–400)
RBC: 3.7 MIL/uL — ABNORMAL LOW (ref 3.87–5.11)
RDW: 14.8 % (ref 11.5–15.5)
WBC: 8.3 10*3/uL (ref 4.0–10.5)
nRBC: 0 % (ref 0.0–0.2)

## 2019-08-07 LAB — PROTIME-INR
INR: 1.4 — ABNORMAL HIGH (ref 0.8–1.2)
Prothrombin Time: 17.3 seconds — ABNORMAL HIGH (ref 11.4–15.2)

## 2019-08-07 LAB — BASIC METABOLIC PANEL
Anion gap: 8 (ref 5–15)
BUN: 19 mg/dL (ref 8–23)
CO2: 31 mmol/L (ref 22–32)
Calcium: 8.7 mg/dL — ABNORMAL LOW (ref 8.9–10.3)
Chloride: 97 mmol/L — ABNORMAL LOW (ref 98–111)
Creatinine, Ser: 0.59 mg/dL (ref 0.44–1.00)
GFR calc Af Amer: >60 mL/min (ref >60)
GFR calc non Af Amer: >60 mL/min (ref >60)
Glucose, Bld: 130 mg/dL — ABNORMAL HIGH (ref 70–99)
Potassium: 3.3 mmol/L — ABNORMAL LOW (ref 3.5–5.1)
Sodium: 136 mmol/L (ref 135–145)

## 2019-08-07 LAB — MRSA PCR SCREENING: MRSA by PCR: NEGATIVE

## 2019-08-07 MED ORDER — ADULT MULTIVITAMIN W/MINERALS CH
1.0000 | ORAL_TABLET | Freq: Every day | ORAL | Status: DC
  Administered 2019-08-07 – 2019-08-12 (×6): 1 via ORAL
  Filled 2019-08-07 (×6): qty 1

## 2019-08-07 MED ORDER — WARFARIN SODIUM 7.5 MG PO TABS
7.5000 mg | ORAL_TABLET | Freq: Once | ORAL | Status: AC
  Administered 2019-08-07: 7.5 mg via ORAL
  Filled 2019-08-07: qty 1

## 2019-08-07 MED ORDER — MAGNESIUM SULFATE 2 GM/50ML IV SOLN
2.0000 g | Freq: Once | INTRAVENOUS | Status: AC
  Administered 2019-08-07: 2 g via INTRAVENOUS
  Filled 2019-08-07: qty 50

## 2019-08-07 MED ORDER — CHLORHEXIDINE GLUCONATE CLOTH 2 % EX PADS
6.0000 | MEDICATED_PAD | Freq: Every day | CUTANEOUS | Status: DC
  Administered 2019-08-07 – 2019-08-11 (×5): 6 via TOPICAL

## 2019-08-07 MED ORDER — ORAL CARE MOUTH RINSE
15.0000 mL | Freq: Two times a day (BID) | OROMUCOSAL | Status: DC
  Administered 2019-08-08 – 2019-08-12 (×9): 15 mL via OROMUCOSAL

## 2019-08-07 NOTE — ED Notes (Signed)
Patient has asked several times about her hearing aids. Patient unsure if she left them at home or not. Unable to find hearing aids in room or in patient's belongings.

## 2019-08-07 NOTE — Progress Notes (Signed)
ANTICOAGULATION CONSULT NOTE -  Pharmacy Consult for Coumadin/Lovenox Indication: h/o PE, protein C&S deficiency  Allergies  Allergen Reactions  . Adhesive [Tape] Itching  . Penicillins Itching, Swelling and Rash    Streptomycin also  . Tuberculin Tests Itching, Swelling and Rash    Patient Measurements: Height: 5\' 2"  (157.5 cm) Weight: 179 lb 3.7 oz (81.3 kg) IBW/kg (Calculated) : 50.1  Vital Signs: Temp: 98.1 F (36.7 C) (12/26 0354) Temp Source: Oral (12/26 0354) BP: 146/62 (12/26 0600) Pulse Rate: 59 (12/26 0600)  Labs: Recent Labs    08/05/19 1736 08/06/19 0707 08/07/19 0423  HGB 12.6 12.0 11.5*  HCT 37.6 36.1 35.3*  PLT 206 186 194  APTT  --  31  --   LABPROT 12.0 13.7 17.3*  INR 0.9 1.1 1.4*  CREATININE 0.49 0.48 0.59    Estimated Creatinine Clearance: 48 mL/min (by C-G formula based on SCr of 0.59 mg/dL).   Medical History: Past Medical History:  Diagnosis Date  . Asthma   . Hypertension   . PE (pulmonary embolism)   . Protein C deficiency ([place])   . Protein S deficiency ([place])     Medications:  Awaiting electronic med rec  Assessment: 85 y.o. F presents with failure to thrive. Pt on coumadin PTA for h/o PE, protein C & S deficiency. Admission INR 0.9 (subtherapeutic). Discussed with Dr. Josephine Cables and he would like to add full-dose Lovenox until INR >/= 2.  INR 1.1 Home dose is 5mg /day.   Goal of Therapy:  INR 2-3 Monitor platelets by anticoagulation protocol: Yes   Plan:  Coumadin 7.5mg  po x 1 today Daily INR Lovenox 90mg  SQ q12h (until INR >/=2) CBC q72h while on Lovenox  Donna Christen Emanuell Morina, PharmD, MBA, BCGP Clinical Pharmacist  08/07/2019,7:55 AM

## 2019-08-07 NOTE — Progress Notes (Signed)
Initial Nutrition Assessment  DOCUMENTATION CODES:   Obesity unspecified  INTERVENTION:   -Liberalize diet  -MVI with minerals daily  -Monitor magnesium, potassium, and phosphorus daily for at least 3 days, MD to replete as needed, as pt is at risk for refeeding syndrome given history of present illness, per labs K 3.3, Mg 1.6  NUTRITION DIAGNOSIS:   Predicted suboptimal nutrient intake related to acute illness(COVID-19 pneumonia) as evidenced by other (comment)(per chart, pt failure to thrive and reports difficulties caring for herself and weakness PTA).   GOAL:   Patient will meet greater than or equal to 90% of their needs    MONITOR:   PO intake, Labs, Diet advancement, Weight trends, I & O's  REASON FOR ASSESSMENT:   Consult Assessment of nutrition requirement/status, Poor PO  ASSESSMENT:  RD working remotely.   85 year old female with past medical history of HTN, pulmonary embolism, h/o protein C and Protein S deficiency who was recently admitted to [facility] due to COVID-19 pneumonia. Patient presented to ED with complaints of increased leg swelling over the past 2 days.In ED CXR showed resolving viral pneumonia, Doppler ultrasound negative of DVT, but showed extensive soft tissue edema  Patient admitted with adult failure to thrive.   Per chart review, patient reports that she is not able to walk and is very weak. No recorded meals at this time. Patient currently on renal diet, recommend liberalizing diet to Heart Healthy. Patient is not ESRD on HD; current diet order restricts potassium and phosphorous. Patient likely has been without adequate nutrition over the past couple of weeks considering recent COVID pneumonia, reports of weakness and difficulties caring for herself 2 days prior to admission per chart, and admission for failure to thrive. Per labs, pt with hypokalemia and hypomagnesemia, suspect patient may be refeeding, will continue to monitor  labs and have made MD aware.   Current wt 81.3 kg (178.86 lbs) +3 BLE edema per review of 12/26 RN flowsheet.  No recent wt history available for review, per care everywhere pt 79.4 kg (175 lbs) in May 2019  I/Os -1700 ml since admit  Medications reviewed and include: Lasix IV 20 mg twice daily, Protonix 40 mg, Klor-con 10 nEq TID, Senokot  Labs: K 3.3 (L), Mg 1.6 (L) 12/25 PO4 3.6 (WNL)  NUTRITION - FOCUSED PHYSICAL EXAM: Unable to complete at this time, RD working remotely.   Diet Order:   Diet Order            Diet Heart Room service appropriate? Yes; Fluid consistency: Thin  Diet effective now              EDUCATION NEEDS:   No education needs have been identified at this time  Skin:  Skin Assessment: Reviewed RN Assessment(stage II; medial back)  Last BM:  Unknown  Height:   Ht Readings from Last 1 Encounters:  08/07/19 5\' 2"  (1.575 m)    Weight:   Wt Readings from Last 1 Encounters:  08/07/19 81.3 kg    Ideal Body Weight:  50 kg  BMI:  Body mass index is 32.78 kg/m.  Estimated Nutritional Needs:   Kcal:  [id number]  Protein:  70-78  Fluid:  1.2 L/day per MD   Lajuan Lines, RD, LDN Clinical Nutrition [place] Telephone [phone] After Hours/Weekend Pager: [phone]

## 2019-08-07 NOTE — Progress Notes (Signed)
PROGRESS NOTE [facility]   Melissa Lambert  [id number]  DOB: 1929-03-28  DOA: 08/05/2019 PCP: Monico Blitz, MD   Brief Admission Hx: 85 y.o. female with medical history significant for hypertension, pulmonary embolism, history of protein C and insufficiency who presents to the emergency department due to increased difficulty in being able to ambulate due to 2-3 days onset of leg swelling.  She states that she was recently admitted to [facility] due to pneumonia and COVID-19  MDM/Assessment & Plan:   1. Bilateral lower extremity edema-ultrasound negative for DVT, suspect caused by congestive heart failure.  She has an elevated BNP.  She has been started on IV Lasix.  Continue to monitor add TED hose.  Follow-up echocardiogram.  Low-sodium diet. 2. Chronic hyponatremia / volume overload-sodium is improving with diuresis. 3. Hypoalbuminemia-protein supplement ordered dietitian consultation. 4. Generalized weakness-patient unable to ambulate, PT evaluation requested likely will need SNF placement 5. History of pulmonary embolus and protein C deficiency she is chronically anticoagulated with warfarin but has not been taking consistently and is subtherapeutic and now she is on heparin pending therapeutic warfarin level. 6. Essential hypertension-she is on amlodipine 7.5 mg daily however I wonder if that should be changed given her lower extremity edema.  Amlodipine and these higher doses tends to contribute to the leg edema. 7. GERD-PPI 8. Hypokalemia / hypomagnesemia - IV and oral replacement ordered, recheck in AM. Monitor for refeeding syndrome. Check phos in AM.   DVT prophylaxis: Lovenox Code Status: Full code Family Communication: None Disposition Plan: SNF   Consultants:    Procedures:    Antimicrobials:     Subjective: Patient reports she remains feeling weak but some better than yesterday  Objective: Vitals:   08/07/19 0700 08/07/19 0800  08/07/19 0900 08/07/19 1000  BP: (!) 153/64 (!) 148/61 124/74 124/63  Pulse: (!) 57 (!) 57 78 62  Resp: 18 16 (!) 24 19  Temp:      TempSrc:      SpO2: 96% 96% 94% 97%  Weight:      Height:        Intake/Output Summary (Last 24 hours) at 08/07/2019 1603 Last data filed at 08/07/2019 0800 Gross per 24 hour  Intake 360 ml  Output 1000 ml  Net -640 ml   Filed Weights   08/05/19 1648 08/07/19 0354  Weight: 92.3 kg 81.3 kg   REVIEW OF SYSTEMS  As per history otherwise all reviewed and reported negative  Exam:  General exam: Elderly female chronically ill-appearing female sitting up in bed eating breakfast. Respiratory system: Shallow breathing. No increased work of breathing. Cardiovascular system: S1 & S2 heard. No JVD, murmurs, gallops, clicks or pedal edema. Gastrointestinal system: Abdomen is nondistended, soft and nontender. Normal bowel sounds heard. Central nervous system: Alert and oriented. No focal neurological deficits. Extremities: 1+ pitting edema bilateral lower extremities.  Data Reviewed: Basic Metabolic Panel: Recent Labs  Lab 08/05/19 1736 08/06/19 0707 08/07/19 0423  NA 131* 134* 136  K 3.3* 3.5 3.3*  CL 99 100 97*  CO2 25 25 31   GLUCOSE 110* 78 130*  BUN 15 14 19   CREATININE 0.49 0.48 0.59  CALCIUM 8.6* 8.5* 8.7*  MG  --  1.8 1.6*  PHOS  --  3.6  --    Liver Function Tests: Recent Labs  Lab 08/05/19 1736 08/06/19 0707  AST 32 26  ALT 30 23  ALKPHOS 63 56  BILITOT 1.3* 1.2  PROT 5.7* 5.0*  ALBUMIN  2.7* 2.4*   No results for input(s): LIPASE, AMYLASE in the last 168 hours. No results for input(s): AMMONIA in the last 168 hours. CBC: Recent Labs  Lab 08/05/19 1736 08/06/19 0707 08/07/19 0423  WBC 9.1 7.9 8.3  HGB 12.6 12.0 11.5*  HCT 37.6 36.1 35.3*  MCV 94.2 96.3 95.4  PLT 206 186 194   Cardiac Enzymes: No results for input(s): CKTOTAL, CKMB, CKMBINDEX, TROPONINI in the last 168 hours. CBG (last 3)  No results for input(s):  GLUCAP in the last 72 hours. Recent Results (from the past 240 hour(s))  SARS CORONAVIRUS 2 (TAT 6-24 HRS) Nasopharyngeal Nasopharyngeal Swab     Status: None   Collection Time: 08/05/19  8:17 PM   Specimen: Nasopharyngeal Swab  Result Value Ref Range Status   SARS Coronavirus 2 NEGATIVE NEGATIVE Final    Comment: (NOTE) SARS-CoV-2 target nucleic acids are NOT DETECTED. The SARS-CoV-2 RNA is generally detectable in upper and lower respiratory specimens during the acute phase of infection. Negative results do not preclude SARS-CoV-2 infection, do not rule out co-infections with other pathogens, and should not be used as the sole basis for treatment or other patient management decisions. Negative results must be combined with clinical observations, patient history, and epidemiological information. The expected result is Negative. Fact Sheet for Patients: SugarRoll.be Fact Sheet for Healthcare Providers: https://www.woods-mathews.com/ This test is not yet approved or cleared by the [place] FDA and  has been authorized for detection and/or diagnosis of SARS-CoV-2 by FDA under an Emergency Use Authorization (EUA). This EUA will remain  in effect (meaning this test can be used) for the duration of the COVID-19 declaration under Section 56 4(b)(1) of the Act, 21 U.S.C. section 360bbb-3(b)(1), unless the authorization is terminated or revoked sooner. Performed at [facility], [place] [address], [place], [place] [address]   MRSA PCR Screening     Status: None   Collection Time: 08/07/19  3:40 AM   Specimen: Nasal Mucosa; Nasopharyngeal  Result Value Ref Range Status   MRSA by PCR NEGATIVE NEGATIVE Final    Comment:        The GeneXpert MRSA Assay (FDA approved for NASAL specimens only), is one component of a comprehensive MRSA colonization surveillance program. It is not intended to diagnose MRSA infection nor to guide or monitor  treatment for MRSA infections. Performed at [facility], [address], [place], [place] [address]      Studies: DG Chest 1 View  Result Date: 08/05/2019 CLINICAL DATA:  Failure to thrive. Recently hospitalized for reported COVID-19 pneumonia. EXAM: CHEST  1 VIEW COMPARISON:  Radiographs and CT 07/22/2019. Radiographs 08/20/2017. FINDINGS: 1734 hours the heart size and mediastinal contours are stable with aortic atherosclerosis. The multifocal airspace and ground-glass opacity seen in the lungs on the most recent prior study have nearly resolved. There are residual multifocal densities which appear largely chronic, similar to the 08/20/2017 study. There is no pleural effusion or pneumothorax. Patient is status post left shoulder arthroplasty. Moderate glenohumeral degenerative changes are present on the right. There are old left-sided rib fractures. IMPRESSION: 1. Near complete resolution of multifocal airspace and ground-glass opacities since the most recent prior study consistent with resolving viral pneumonia. 2. Remaining pulmonary parenchymal densities appear chronic. No new findings. Electronically Signed   By: Richardean Sale M.D.   On: 08/05/2019 18:11   US Venous Img Lower Bilateral  Result Date: 08/05/2019 CLINICAL DATA:  Lower extremity swelling. EXAM: BILATERAL LOWER EXTREMITY VENOUS DOPPLER  ULTRASOUND TECHNIQUE: Gray-scale sonography with graded compression, as well as color Doppler and duplex ultrasound were performed to evaluate the lower extremity deep venous systems from the level of the common femoral vein and including the common femoral, femoral, profunda femoral, popliteal and calf veins including the posterior tibial, peroneal and gastrocnemius veins when visible. The superficial great saphenous vein was also interrogated. Spectral Doppler was utilized to evaluate flow at rest and with distal augmentation maneuvers in the common femoral, femoral and popliteal veins. COMPARISON:   None. FINDINGS: RIGHT LOWER EXTREMITY Common Femoral Vein: No evidence of thrombus. Normal compressibility, respiratory phasicity and response to augmentation. Saphenofemoral Junction: No evidence of thrombus. Normal compressibility and flow on color Doppler imaging. Profunda Femoral Vein: No evidence of thrombus. Normal compressibility and flow on color Doppler imaging. Femoral Vein: No evidence of thrombus. Normal compressibility, respiratory phasicity and response to augmentation. Popliteal Vein: No evidence of thrombus. Normal compressibility, respiratory phasicity and response to augmentation. Calf Veins: No evidence of thrombus. Normal compressibility and flow on color Doppler imaging. Other Findings:  Extensive soft tissue edema. LEFT LOWER EXTREMITY Common Femoral Vein: No evidence of thrombus. Normal compressibility, respiratory phasicity and response to augmentation. Saphenofemoral Junction: No evidence of thrombus. Normal compressibility and flow on color Doppler imaging. Profunda Femoral Vein: No evidence of thrombus. Normal compressibility and flow on color Doppler imaging. Femoral Vein: No evidence of thrombus. Normal compressibility, respiratory phasicity and response to augmentation. Popliteal Vein: No evidence of thrombus. Normal compressibility, respiratory phasicity and response to augmentation. Calf Veins: No evidence of thrombus. Normal compressibility and flow on color Doppler imaging. Other Findings:  Extensive soft tissue edema IMPRESSION: No evidence of deep venous thrombosis in either lower extremity. Extensive soft tissue edema. Electronically Signed   By: Misty Stanley M.D.   On: 08/05/2019 18:43     Scheduled Meds: . amLODipine  5 mg Oral Daily  . Chlorhexidine Gluconate Cloth  6 each Topical Daily  . enoxaparin (LOVENOX) injection  90 mg Subcutaneous Q12H  . feeding supplement (ENSURE ENLIVE)  237 mL Oral BID BM  . furosemide  20 mg Intravenous BID  . multivitamin with  minerals  1 tablet Oral Daily  . oxybutynin  5 mg Oral Daily  . pantoprazole  40 mg Oral Daily  . potassium chloride  10 mEq Oral TID  . senna  2 tablet Oral BID  . warfarin  7.5 mg Oral ONCE-1800  . Warfarin - Pharmacist Dosing Inpatient   Does not apply q1800   Continuous Infusions:  Principal Problem:   Adult failure to thrive Active Problems:   History of pulmonary embolus (PE)   Essential hypertension   Protein C deficiency (HCC)   Elevated brain natriuretic peptide (BNP) level   GERD (gastroesophageal reflux disease)   Hypoalbuminemia   Hyponatremia   Generalized weakness   Bilateral lower extremity edema   Pressure injury of skin   Time spent:   Irwin Brakeman, MD [facility] 08/07/2019, 4:03 PM    LOS: 1 day  How to contact the [facility] Attending or Consulting provider [place] or covering provider during after hours [place], for this patient?  1. Check the care team in [facility] and look for a) attending/consulting [facility] provider listed and b) the [facility] team listed 2. Log into [email or web address] and use [place]'s universal password to access. If you do not have the password, please contact the hospital operator. 3. Locate the [facility] provider you are looking for under [facility] and  page to a number that you can be directly reached. 4. If you still have difficulty reaching the provider, please page the [facility] (Director on Call) for the Hospitalists listed on amion for assistance.

## 2019-08-08 ENCOUNTER — Inpatient Hospital Stay (HOSPITAL_COMMUNITY): Payer: Medicare Other

## 2019-08-08 DIAGNOSIS — R600 Localized edema: Secondary | ICD-10-CM

## 2019-08-08 DIAGNOSIS — I361 Nonrheumatic tricuspid (valve) insufficiency: Secondary | ICD-10-CM

## 2019-08-08 LAB — PROTIME-INR
INR: 1.7 — ABNORMAL HIGH (ref 0.8–1.2)
Prothrombin Time: 20.3 seconds — ABNORMAL HIGH (ref 11.4–15.2)

## 2019-08-08 LAB — BASIC METABOLIC PANEL
Anion gap: 8 (ref 5–15)
BUN: 23 mg/dL (ref 8–23)
CO2: 28 mmol/L (ref 22–32)
Calcium: 8.4 mg/dL — ABNORMAL LOW (ref 8.9–10.3)
Chloride: 100 mmol/L (ref 98–111)
Creatinine, Ser: 0.58 mg/dL (ref 0.44–1.00)
GFR calc Af Amer: >60 mL/min (ref >60)
GFR calc non Af Amer: >60 mL/min (ref >60)
Glucose, Bld: 109 mg/dL — ABNORMAL HIGH (ref 70–99)
Potassium: 3.5 mmol/L (ref 3.5–5.1)
Sodium: 136 mmol/L (ref 135–145)

## 2019-08-08 LAB — MAGNESIUM: Magnesium: 1.9 mg/dL (ref 1.7–2.4)

## 2019-08-08 LAB — CBC
HCT: 30.5 % — ABNORMAL LOW (ref 36.0–46.0)
Hemoglobin: 9.8 g/dL — ABNORMAL LOW (ref 12.0–15.0)
MCH: 31 pg (ref 26.0–34.0)
MCHC: 32.1 g/dL (ref 30.0–36.0)
MCV: 96.5 fL (ref 80.0–100.0)
Platelets: 169 10*3/uL (ref 150–400)
RBC: 3.16 MIL/uL — ABNORMAL LOW (ref 3.87–5.11)
RDW: 15.1 % (ref 11.5–15.5)
WBC: 6.5 10*3/uL (ref 4.0–10.5)
nRBC: 0 % (ref 0.0–0.2)

## 2019-08-08 LAB — ECHOCARDIOGRAM COMPLETE
Height: 62 in
Weight: 2885.38 oz

## 2019-08-08 LAB — PHOSPHORUS: Phosphorus: 2.7 mg/dL (ref 2.5–4.6)

## 2019-08-08 MED ORDER — WARFARIN SODIUM 7.5 MG PO TABS
7.5000 mg | ORAL_TABLET | Freq: Once | ORAL | Status: AC
  Administered 2019-08-08: 7.5 mg via ORAL
  Filled 2019-08-08: qty 1

## 2019-08-08 NOTE — Progress Notes (Signed)
*  PRELIMINARY RESULTS* Echocardiogram 2D Echocardiogram has been performed.  Melissa Lambert 08/08/2019, 11:00 AM

## 2019-08-08 NOTE — Progress Notes (Addendum)
ANTICOAGULATION CONSULT NOTE -  Pharmacy Consult for Coumadin/Lovenox Indication: h/o PE, protein C&S deficiency  Allergies  Allergen Reactions  . Adhesive [Tape] Itching  . Penicillins Itching, Swelling and Rash    Streptomycin also  . Tuberculin Tests Itching, Swelling and Rash    Patient Measurements: Height: 5\' 2"  (157.5 cm) Weight: 180 lb 5.4 oz (81.8 kg) IBW/kg (Calculated) : 50.1  Vital Signs: Temp: 98.9 F (37.2 C) (12/27 0400) Temp Source: Oral (12/27 0400) BP: 124/58 (12/27 0600) Pulse Rate: 59 (12/27 0600)  Labs: Recent Labs    08/06/19 0707 08/07/19 0423 08/08/19 0500  HGB 12.0 11.5* 9.8*  HCT 36.1 35.3* 30.5*  PLT 186 194 169  APTT 31  --   --   LABPROT 13.7 17.3* 20.3*  INR 1.1 1.4* 1.7*  CREATININE 0.48 0.59 0.58    Estimated Creatinine Clearance: 48.2 mL/min (by C-G formula based on SCr of 0.58 mg/dL).   Medical History: Past Medical History:  Diagnosis Date  . Asthma   . Hypertension   . PE (pulmonary embolism)   . Protein C deficiency ([place])   . Protein S deficiency ([place])     Medications:  Awaiting electronic med rec  Assessment: 85 y.o. F presents with failure to thrive. Pt on coumadin PTA for h/o PE, protein C & S deficiency. Admission INR 0.9 (subtherapeutic). Discussed with Dr. Josephine Cables and he would like to add full-dose Lovenox until INR >/= 2.  INR 1.7 Home dose is 5mg /day.   Goal of Therapy:  INR 2-3 Monitor platelets by anticoagulation protocol: Yes   Plan:  Coumadin 7.5mg  po x 1 today Daily INR Lovenox 90mg  SQ q12h (until INR >/=2)  CBC q72h while on Lovenox  Donna Christen Lakecia Deschamps, PharmD, MBA, BCGP Clinical Pharmacist  08/08/2019,8:49 AM

## 2019-08-08 NOTE — Progress Notes (Signed)
PROGRESS NOTE [facility]   Melissa Lambert  [id number]  DOB: 07-20-1929  DOA: 08/05/2019 PCP: Monico Blitz, MD   Brief Admission Hx: 85 y.o. female with medical history significant for hypertension, pulmonary embolism, history of protein C and insufficiency who presents to the emergency department due to increased difficulty in being able to ambulate due to 2-3 days onset of leg swelling.  She states that she was recently admitted to [facility] due to pneumonia and COVID-19  MDM/Assessment & Plan:   1. Bilateral lower extremity edema-ultrasound negative for DVT, edema caused by congestive heart failure.  She has an elevated BNP.  She has been started on IV Lasix.  Continue to monitor add TED hose.  Follow-up echocardiogram.  Low-sodium diet. 2. Acute diastolic CHF exacerbation - Pt responding well to IV lasix.  Weight is trending down.   3. Chronic hyponatremia / volume overload-sodium is improving with diuresis. 4. Hypoalbuminemia-protein supplement ordered dietitian consultation. 5. Generalized weakness-patient unable to ambulate, PT evaluation requested likely will need SNF placement 6. History of pulmonary embolus and protein C deficiency she is chronically anticoagulated with warfarin but has not been taking consistently and is subtherapeutic and now she is on lovenox pending therapeutic warfarin level. 7. Essential hypertension-she was on amlodipine 7.5 mg daily and it has been reduced to 5 mg given BLE edema.  Amlodipine at these higher doses tends to contribute to the leg edema. 8. GERD-PPI 9. Hypokalemia / hypomagnesemia - repleted. Monitor for refeeding syndrome.    DVT prophylaxis: Lovenox Code Status: Full code Family Communication: None Disposition Plan: SNF when bed available   Consultants:    Procedures:    Antimicrobials:     Subjective: Patient reports less edema in legs.  She has not been able to ambulate but she is agreeable to SNF  placement.    Objective: Vitals:   08/08/19 0600 08/08/19 0700 08/08/19 0800 08/08/19 0900  BP: (!) 124/58 (!) 118/55 130/63 118/63  Pulse: (!) 59 61 63 74  Resp: 18 20 19  (!) 21  Temp:      TempSrc:      SpO2: 97% 96% 92% 97%  Weight:      Height:        Intake/Output Summary (Last 24 hours) at 08/08/2019 1509 Last data filed at 08/08/2019 0900 Gross per 24 hour  Intake 530 ml  Output 650 ml  Net -120 ml   Filed Weights   08/05/19 1648 08/07/19 0354 08/08/19 0500  Weight: 92.3 kg 81.3 kg 81.8 kg   REVIEW OF SYSTEMS  As per history otherwise all reviewed and reported negative  Exam:  General exam: Elderly female chronically ill-appearing female sitting up in bed eating breakfast. Respiratory system: Shallow breathing. No increased work of breathing. Cardiovascular system: S1 & S2 heard. No JVD, murmurs, gallops, clicks or pedal edema. Gastrointestinal system: Abdomen is nondistended, soft and nontender. Normal bowel sounds heard. Central nervous system: Alert and oriented. No focal neurological deficits. Extremities: 1+ pitting edema bilateral lower extremities.  TED hoses in place.   Data Reviewed: Basic Metabolic Panel: Recent Labs  Lab 08/05/19 1736 08/06/19 0707 08/07/19 0423 08/08/19 0500  NA 131* 134* 136 136  K 3.3* 3.5 3.3* 3.5  CL 99 100 97* 100  CO2 25 25 31 28   GLUCOSE 110* 78 130* 109*  BUN 15 14 19 23   CREATININE 0.49 0.48 0.59 0.58  CALCIUM 8.6* 8.5* 8.7* 8.4*  MG  --  1.8 1.6* 1.9  PHOS  --  3.6  --  2.7   Liver Function Tests: Recent Labs  Lab 08/05/19 1736 08/06/19 0707  AST 32 26  ALT 30 23  ALKPHOS 63 56  BILITOT 1.3* 1.2  PROT 5.7* 5.0*  ALBUMIN 2.7* 2.4*   No results for input(s): LIPASE, AMYLASE in the last 168 hours. No results for input(s): AMMONIA in the last 168 hours. CBC: Recent Labs  Lab 08/05/19 1736 08/06/19 0707 08/07/19 0423 08/08/19 0500  WBC 9.1 7.9 8.3 6.5  HGB 12.6 12.0 11.5* 9.8*  HCT 37.6 36.1 35.3*  30.5*  MCV 94.2 96.3 95.4 96.5  PLT 206 186 194 169   Cardiac Enzymes: No results for input(s): CKTOTAL, CKMB, CKMBINDEX, TROPONINI in the last 168 hours. CBG (last 3)  No results for input(s): GLUCAP in the last 72 hours. Recent Results (from the past 240 hour(s))  SARS CORONAVIRUS 2 (TAT 6-24 HRS) Nasopharyngeal Nasopharyngeal Swab     Status: None   Collection Time: 08/05/19  8:17 PM   Specimen: Nasopharyngeal Swab  Result Value Ref Range Status   SARS Coronavirus 2 NEGATIVE NEGATIVE Final    Comment: (NOTE) SARS-CoV-2 target nucleic acids are NOT DETECTED. The SARS-CoV-2 RNA is generally detectable in upper and lower respiratory specimens during the acute phase of infection. Negative results do not preclude SARS-CoV-2 infection, do not rule out co-infections with other pathogens, and should not be used as the sole basis for treatment or other patient management decisions. Negative results must be combined with clinical observations, patient history, and epidemiological information. The expected result is Negative. Fact Sheet for Patients: SugarRoll.be Fact Sheet for Healthcare Providers: https://www.woods-mathews.com/ This test is not yet approved or cleared by the [place] FDA and  has been authorized for detection and/or diagnosis of SARS-CoV-2 by FDA under an Emergency Use Authorization (EUA). This EUA will remain  in effect (meaning this test can be used) for the duration of the COVID-19 declaration under Section 56 4(b)(1) of the Act, 21 U.S.C. section 360bbb-3(b)(1), unless the authorization is terminated or revoked sooner. Performed at [facility], [place] [address], [place], [place] [address]   MRSA PCR Screening     Status: None   Collection Time: 08/07/19  3:40 AM   Specimen: Nasal Mucosa; Nasopharyngeal  Result Value Ref Range Status   MRSA by PCR NEGATIVE NEGATIVE Final    Comment:        The GeneXpert MRSA  Assay (FDA approved for NASAL specimens only), is one component of a comprehensive MRSA colonization surveillance program. It is not intended to diagnose MRSA infection nor to guide or monitor treatment for MRSA infections. Performed at [facility], [address] [place], [place] [address]      Studies: ECHOCARDIOGRAM COMPLETE  Result Date: 08/08/2019   ECHOCARDIOGRAM REPORT   Patient Name:   Melissa Lambert Date of Exam: 08/08/2019 Medical Rec #:  [id number]       Height:       62.0 in Accession #:    [id number]      Weight:       180.3 lb Date of Birth:  September 07, 1928        BSA:          1.83 m Patient Age:    5 years        BP:           124/58 mmHg Patient Gender: F               HR:  59 bpm. Exam Location:  [facility] Procedure: 2D Echo, Cardiac Doppler and Color Doppler Indications:    Dyspnea 786.09 / R06.00  History:        Patient has no prior history of Echocardiogram examinations.                 Risk Factors:Hypertension. Elevated brain natriuretic peptide                 (BNP) level,Bilateral lower extremity edema,History of pulmonary                 embolus (PE),GERD.  Sonographer:    Alvino Chapel RCS Referring Phys: [place]  1. Left ventricular ejection fraction, by visual estimation, is 60 to 65%. The left ventricle has normal function. There is mildly increased left ventricular hypertrophy.  2. Left ventricular diastolic parameters are consistent with Grade I diastolic dysfunction (impaired relaxation). Normal left heart filling pressures.  3. The left ventricle has no regional wall motion abnormalities.  4. Global right ventricle has normal systolic function.The right ventricular size is normal. No increase in right ventricular wall thickness.  5. Left atrial size was severely dilated.  6. Right atrial size was normal.  7. Moderate mitral annular calcification.  8. The mitral valve is normal in structure. No evidence of mitral valve  regurgitation.  9. The tricuspid valve is grossly normal. 10. The aortic valve is tricuspid. Aortic valve regurgitation is trivial. Mild aortic valve sclerosis without stenosis. 11. The pulmonic valve was not well visualized. Pulmonic valve regurgitation is not visualized. 12. Normal pulmonary artery systolic pressure. 13. The inferior vena cava is normal in size with greater than 50% respiratory variability, suggesting right atrial pressure of 3 mmHg. FINDINGS  Left Ventricle: Left ventricular ejection fraction, by visual estimation, is 60 to 65%. The left ventricle has normal function. The left ventricle has no regional wall motion abnormalities. The left ventricular internal cavity size was the left ventricle is normal in size. There is mildly increased left ventricular hypertrophy. Concentric left ventricular hypertrophy. Left ventricular diastolic parameters are consistent with Grade I diastolic dysfunction (impaired relaxation). Normal left atrial pressure. Right Ventricle: The right ventricular size is normal. No increase in right ventricular wall thickness. Global RV systolic function is has normal systolic function. The tricuspid regurgitant velocity is 2.27 m/s, and with an assumed right atrial pressure  of 3 mmHg, the estimated right ventricular systolic pressure is normal at 23.6 mmHg. Left Atrium: Left atrial size was severely dilated. Right Atrium: Right atrial size was normal in size Pericardium: There is no evidence of pericardial effusion. Mitral Valve: The mitral valve is normal in structure. Moderate mitral annular calcification. No evidence of mitral valve regurgitation. Tricuspid Valve: The tricuspid valve is grossly normal. Tricuspid valve regurgitation is mild. Aortic Valve: The aortic valve is tricuspid. Aortic valve regurgitation is trivial. Mild aortic valve sclerosis is present, with no evidence of aortic valve stenosis. Aortic valve mean gradient measures 8.0 mmHg. Aortic valve peak  gradient measures 17.0 mmHg. Aortic valve area, by VTI measures 1.57 cm. Pulmonic Valve: The pulmonic valve was not well visualized. Pulmonic valve regurgitation is not visualized. Pulmonic regurgitation is not visualized. Aorta: The aortic root is normal in size and structure. Venous: The inferior vena cava is normal in size with greater than 50% respiratory variability, suggesting right atrial pressure of 3 mmHg. IAS/Shunts: No atrial level shunt detected by color flow Doppler.  LEFT VENTRICLE PLAX 2D LVIDd:  4.14 cm       Diastology LVIDs:         3.04 cm       LV e' lateral:   8.70 cm/s LV PW:         1.03 cm       LV E/e' lateral: 8.5 LV IVS:        1.41 cm       LV e' medial:    8.16 cm/s LVOT diam:     1.70 cm       LV E/e' medial:  9.0 LV SV:         40 ml LV SV Index:   20.59 LVOT Area:     2.27 cm  LV Volumes (MOD) LV area d, A2C:    21.50 cm LV area d, A4C:    21.10 cm LV area s, A2C:    11.20 cm LV area s, A4C:    11.80 cm LV major d, A2C:   6.48 cm LV major d, A4C:   6.28 cm LV major s, A2C:   5.20 cm LV major s, A4C:   5.54 cm LV vol d, MOD A2C: 61.0 ml LV vol d, MOD A4C: 58.4 ml LV vol s, MOD A2C: 22.4 ml LV vol s, MOD A4C: 21.5 ml LV SV MOD A2C:     38.6 ml LV SV MOD A4C:     58.4 ml LV SV MOD BP:      38.1 ml RIGHT VENTRICLE RV S prime:     14.60 cm/s TAPSE (M-mode): 2.4 cm LEFT ATRIUM              Index       RIGHT ATRIUM           Index LA diam:        2.60 cm  1.42 cm/m  RA Area:     15.70 cm LA Vol (A2C):   100.0 ml 54.66 ml/m RA Volume:   34.10 ml  18.64 ml/m LA Vol (A4C):   91.6 ml  50.07 ml/m LA Biplane Vol: 99.5 ml  54.39 ml/m  AORTIC VALVE AV Area (Vmax):    1.47 cm AV Area (Vmean):   1.56 cm AV Area (VTI):     1.57 cm AV Vmax:           206.00 cm/s AV Vmean:          128.000 cm/s AV VTI:            0.399 m AV Peak Grad:      17.0 mmHg AV Mean Grad:      8.0 mmHg LVOT Vmax:         133.00 cm/s LVOT Vmean:        87.800 cm/s LVOT VTI:          0.276 m LVOT/AV VTI ratio:  0.69  AORTA Ao Root diam: 2.90 cm MITRAL VALVE                         TRICUSPID VALVE MV Area (PHT): 2.03 cm              TR Peak grad:   20.6 mmHg MV PHT:        108.17 msec           TR Vmax:        227.00 cm/s MV Decel Time: 373 msec MV E velocity: 73.70 cm/s  103 cm/s  SHUNTS MV A velocity: 106.00 cm/s 70.3 cm/s Systemic VTI:  0.28 m MV E/A ratio:  0.70        1.5       Systemic Diam: 1.70 cm  Dani Gobble Croitoru MD Electronically signed by Sanda Klein MD Signature Date/Time: 08/08/2019/11:36:06 AM    Final      Scheduled Meds: . amLODipine  5 mg Oral Daily  . Chlorhexidine Gluconate Cloth  6 each Topical Daily  . enoxaparin (LOVENOX) injection  90 mg Subcutaneous Q12H  . feeding supplement (ENSURE ENLIVE)  237 mL Oral BID BM  . furosemide  20 mg Intravenous BID  . mouth rinse  15 mL Mouth Rinse BID  . multivitamin with minerals  1 tablet Oral Daily  . oxybutynin  5 mg Oral Daily  . pantoprazole  40 mg Oral Daily  . potassium chloride  10 mEq Oral TID  . senna  2 tablet Oral BID  . warfarin  7.5 mg Oral ONCE-1800  . Warfarin - Pharmacist Dosing Inpatient   Does not apply q1800   Continuous Infusions:  Principal Problem:   Adult failure to thrive Active Problems:   History of pulmonary embolus (PE)   Essential hypertension   Protein C deficiency (HCC)   Elevated brain natriuretic peptide (BNP) level   GERD (gastroesophageal reflux disease)   Hypoalbuminemia   Hyponatremia   Generalized weakness   Bilateral lower extremity edema   Pressure injury of skin   Time spent:   Irwin Brakeman, MD [facility] 08/08/2019, 3:09 PM    LOS: 2 days  How to contact the [facility] Attending or Consulting provider [place] or covering provider during after hours [place], for this patient?  1. Check the care team in [facility] and look for a) attending/consulting TR[facility] provider listed and b) the [facility] team listed 2. Log into [email or web address] and use [place] universal password to access. If you  do not have the password, please contact the hospital operator. 3. Locate the [facility] provider you are looking for under [facility] and page to a number that you can be directly reached. 4. If you still have difficulty reaching the provider, please page the [facility] (Director on Call) for the Hospitalists listed on amion for assistance.

## 2019-08-09 ENCOUNTER — Inpatient Hospital Stay (HOSPITAL_COMMUNITY): Payer: Medicare Other

## 2019-08-09 LAB — BASIC METABOLIC PANEL
Anion gap: 10 (ref 5–15)
BUN: 20 mg/dL (ref 8–23)
CO2: 29 mmol/L (ref 22–32)
Calcium: 8.2 mg/dL — ABNORMAL LOW (ref 8.9–10.3)
Chloride: 97 mmol/L — ABNORMAL LOW (ref 98–111)
Creatinine, Ser: 0.5 mg/dL (ref 0.44–1.00)
GFR calc Af Amer: >60 mL/min (ref >60)
GFR calc non Af Amer: >60 mL/min (ref >60)
Glucose, Bld: 95 mg/dL (ref 70–99)
Potassium: 3.3 mmol/L — ABNORMAL LOW (ref 3.5–5.1)
Sodium: 136 mmol/L (ref 135–145)

## 2019-08-09 LAB — CBC
HCT: 29 % — ABNORMAL LOW (ref 36.0–46.0)
Hemoglobin: 9.5 g/dL — ABNORMAL LOW (ref 12.0–15.0)
MCH: 32.2 pg (ref 26.0–34.0)
MCHC: 32.8 g/dL (ref 30.0–36.0)
MCV: 98.3 fL (ref 80.0–100.0)
Platelets: 165 10*3/uL (ref 150–400)
RBC: 2.95 MIL/uL — ABNORMAL LOW (ref 3.87–5.11)
RDW: 15.4 % (ref 11.5–15.5)
WBC: 4.2 10*3/uL (ref 4.0–10.5)
nRBC: 0 % (ref 0.0–0.2)

## 2019-08-09 LAB — PROTIME-INR
INR: 2.4 — ABNORMAL HIGH (ref 0.8–1.2)
Prothrombin Time: 26.1 seconds — ABNORMAL HIGH (ref 11.4–15.2)

## 2019-08-09 LAB — MAGNESIUM: Magnesium: 1.7 mg/dL (ref 1.7–2.4)

## 2019-08-09 MED ORDER — POTASSIUM CHLORIDE CRYS ER 20 MEQ PO TBCR
20.0000 meq | EXTENDED_RELEASE_TABLET | Freq: Three times a day (TID) | ORAL | Status: DC
  Administered 2019-08-09 – 2019-08-10 (×3): 20 meq via ORAL
  Filled 2019-08-09 (×3): qty 1

## 2019-08-09 MED ORDER — ACETAMINOPHEN 325 MG PO TABS
650.0000 mg | ORAL_TABLET | Freq: Four times a day (QID) | ORAL | Status: DC
  Administered 2019-08-09 – 2019-08-12 (×10): 650 mg via ORAL
  Filled 2019-08-09 (×10): qty 2

## 2019-08-09 MED ORDER — MAGNESIUM SULFATE 2 GM/50ML IV SOLN
2.0000 g | Freq: Once | INTRAVENOUS | Status: AC
  Administered 2019-08-09: 2 g via INTRAVENOUS
  Filled 2019-08-09: qty 50

## 2019-08-09 MED ORDER — WARFARIN SODIUM 2 MG PO TABS
2.0000 mg | ORAL_TABLET | Freq: Once | ORAL | Status: AC
  Administered 2019-08-09: 2 mg via ORAL
  Filled 2019-08-09: qty 1

## 2019-08-09 NOTE — Progress Notes (Signed)
ANTICOAGULATION CONSULT NOTE -  Pharmacy Consult for Coumadin Indication: h/o PE, protein C&S deficiency  Allergies  Allergen Reactions  . Adhesive [Tape] Itching  . Penicillins Itching, Swelling and Rash    Streptomycin also  . Tuberculin Tests Itching, Swelling and Rash    Patient Measurements: Height: 5\' 2"  (157.5 cm) Weight: 180 lb 8.9 oz (81.9 kg) IBW/kg (Calculated) : 50.1  Vital Signs: Temp: 98 F (36.7 C) (12/28 0522) Temp Source: Oral (12/28 0522) BP: 119/58 (12/28 0522) Pulse Rate: 64 (12/28 0522)  Labs: Recent Labs    08/07/19 0423 08/08/19 0500 08/09/19 0545  HGB 11.5* 9.8* 9.5*  HCT 35.3* 30.5* 29.0*  PLT 194 169 165  LABPROT 17.3* 20.3* 26.1*  INR 1.4* 1.7* 2.4*  CREATININE 0.59 0.58 0.50    Estimated Creatinine Clearance: 48.2 mL/min (by C-G formula based on SCr of 0.5 mg/dL).   Medical History: Past Medical History:  Diagnosis Date  . Asthma   . Hypertension   . PE (pulmonary embolism)   . Protein C deficiency ([place])   . Protein S deficiency ([place])     Medications:  Awaiting electronic med rec  Assessment: 85 y.o. F presents with failure to thrive. Pt on coumadin PTA for h/o PE, protein C & S deficiency. Admission INR 0.9 (subtherapeutic).  INR 2.4 Home dose is 5mg /day.   Goal of Therapy:  INR 2-3 Monitor platelets by anticoagulation protocol: Yes   Plan:  DC lovenox Reduce coumadin dose to 2 mg x 1 due to large rise in INR. Daily INR  Margot Ables, PharmD Clinical Pharmacist 08/09/2019 1:12 PM

## 2019-08-09 NOTE — Evaluation (Signed)
Physical Therapy Evaluation Patient Details Name: Melissa Lambert MRN: [id number] DOB: 1929/05/21 Today's Date: 08/09/2019   History of Present Illness  Melissa Lambert is an 85 y.o. female with medical history significant for hypertension, pulmonary embolism, history of protein C and insufficiency who presents to the emergency department due to increased difficulty in being able to ambulate due to 2-3 days onset of leg swelling.  She states that she was recently admitted to [facility] due to pneumonia and COVID-19, she was discharged a week ago to a skilled nursing facility but she refused, so she went home and she lives alone.  Patient states that she ambulates with a cane and occasionally with a walker at baseline, but she has not been able to move around within last 2 days due to increased leg swelling.  Patient states that it has been more difficult to care for self since onset of her symptoms.  She denies chest pain, shortness of breath, fever, chills, nausea, vomiting, diarrhea.    Clinical Impression  Patient very weak with frequent falling backwards during supine to sitting, had to use BUE for support to maintain sitting balance, limited to a few slow unsteady side steps at bedside due to weakness and fall risk.  Patient had difficulty sitting up in chair due to extremely kyphotic trunk and had recline chair and elevate for legs for patient to tolerate staying up in chair after therapy - RN/NT notified.  Patient will benefit from continued physical therapy in hospital and recommended venue below to increase strength, balance, endurance for safe ADLs and gait.    Follow Up Recommendations SNF    Equipment Recommendations  None recommended by PT    Recommendations for Other Services       Precautions / Restrictions Precautions Precautions: Fall Restrictions Weight Bearing Restrictions: No      Mobility  Bed Mobility Overal bed mobility: Needs Assistance Bed Mobility:  Supine to Sit     Supine to sit: Max assist     General bed mobility comments: slow labored movement, frequent falling backwards  Transfers Overall transfer level: Needs assistance Equipment used: Rolling walker (2 wheeled) Transfers: Sit to/from [facility] Sit to Stand: Mod assist Stand pivot transfers: Mod assist;Max assist       General transfer comment: slow labored movement, legs buckling  Ambulation/Gait Ambulation/Gait assistance: Mod assist;Max assist Gait Distance (Feet): 4 Feet Assistive device: Rolling walker (2 wheeled) Gait Pattern/deviations: Decreased step length - right;Decreased step length - left;Decreased stride length Gait velocity: slow   General Gait Details: limited to 4-5 slow labored side steps with buckling of legs and severe fear of falling  Stairs            Wheelchair Mobility    Modified Rankin (Stroke Patients Only)       Balance Overall balance assessment: Needs assistance Sitting-balance support: Feet supported;No upper extremity supported Sitting balance-Leahy Scale: Poor Sitting balance - Comments: frequent falling backwards Postural control: Posterior lean Standing balance support: During functional activity;Bilateral upper extremity supported Standing balance-Leahy Scale: Poor Standing balance comment: fair/poor using RW                             Pertinent Vitals/Pain Pain Assessment: 0-10 Pain Score: 7  Pain Location: mid back Pain Descriptors / Indicators: Aching;Sore;Grimacing;Guarding Pain Intervention(s): Limited activity within patient's tolerance;Monitored during session    Home Living Family/patient expects to be discharged to:: Private residence Living Arrangements:  Other relatives;Alone Available Help at Discharge: Family;Available PRN/intermittently Type of Home: House Home Access: Level entry     Home Layout: Laundry or work area in basement;Able to live on main level  with bedroom/bathroom Home Equipment: Kasandra Knudsen - single point;Walker - 2 wheels;Grab bars - tub/shower;Walker - 4 wheels;Bedside commode;Shower seat;Wheelchair - manual      Prior Function Level of Independence: Needs assistance   Gait / Transfers Assistance Needed: household ambulator with [facility]  ADL's / Homemaking Assistance Needed: assisted by family for community ADLs        Hand Dominance        Extremity/Trunk Assessment   Upper Extremity Assessment Upper Extremity Assessment: Generalized weakness    Lower Extremity Assessment Lower Extremity Assessment: Generalized weakness    Cervical / Trunk Assessment Cervical / Trunk Assessment: Kyphotic(severely kyphotic)  Communication   Communication: HOH  Cognition Arousal/Alertness: Awake/alert Behavior During Therapy: WFL for tasks assessed/performed;Anxious Overall Cognitive Status: Within Functional Limits for tasks assessed                                        General Comments      Exercises     Assessment/Plan    PT Assessment Patient needs continued PT services  PT Problem List Decreased strength;Decreased activity tolerance;Decreased balance;Decreased mobility;Pain       PT Treatment Interventions Gait training;Stair training;Functional mobility training;Therapeutic activities;Therapeutic exercise;Patient/family education;DME instruction    PT Goals (Current goals can be found in the Care Plan section)  Acute Rehab PT Goals Patient Stated Goal: return home after rehab PT Goal Formulation: With patient Time For Goal Achievement: 08/23/19 Potential to Achieve Goals: Good    Frequency Min 3X/week   Barriers to discharge        Co-evaluation               AM-PAC PT "6 Clicks" Mobility  Outcome Measure Help needed turning from your back to your side while in a flat bed without using bedrails?: A Lot Help needed moving from lying on your back to sitting on the side of a flat bed  without using bedrails?: A Lot Help needed moving to and from a bed to a chair (including a wheelchair)?: A Lot Help needed standing up from a chair using your arms (e.g., wheelchair or bedside chair)?: A Lot Help needed to walk in hospital room?: A Lot Help needed climbing 3-5 steps with a railing? : Total 6 Click Score: 11    End of Session Equipment Utilized During Treatment: Oxygen Activity Tolerance: Patient tolerated treatment well;Patient limited by fatigue Patient left: in chair;with call bell/phone within reach;with chair alarm set Nurse Communication: Mobility status PT Visit Diagnosis: Unsteadiness on feet (R26.81);Other abnormalities of gait and mobility (R26.89)    Time: [id number] PT Time Calculation (min) (ACUTE ONLY): 43 min   Charges:   PT Evaluation $PT Eval High Complexity: 1 High PT Treatments $Therapeutic Activity: 38-52 mins        12:40 PM, 08/09/19 Lonell Grandchild, MPT Physical Therapist with [facility] [phone] office [phone] mobile phone

## 2019-08-09 NOTE — NC FL2 (Signed)
[place] LEVEL OF CARE SCREENING TOOL     IDENTIFICATION  Patient Name: Melissa Lambert Birthdate: Mar 26, 1929 Sex: female Admission Date (Current Location): 08/05/2019  [facility] and [place] Number:  [employer] and Address:  [facility] [address] [place]      Provider Number: [id number]  Attending Physician Name and Address:  Murlean Iba, MD  Relative Name and Phone Number:  Wilhelmina Mcardle [facility]: [phone]    Current Level of Care:   Recommended Level of Care: [place] Prior Approval Number:    Date Approved/Denied: 05/03/09 PASRR Number: [id number]  Discharge Plan: SNF    Current Diagnoses: Patient Active Problem List   Diagnosis Date Noted  . Pressure injury of skin 08/07/2019  . Elevated brain natriuretic peptide (BNP) level 08/06/2019  . GERD (gastroesophageal reflux disease) 08/06/2019  . Hypoalbuminemia 08/06/2019  . Hyponatremia 08/06/2019  . Generalized weakness 08/06/2019  . Bilateral lower extremity edema 08/06/2019  . Adult failure to thrive 08/05/2019  . Hip fracture ([place]) 06/16/2014  . Essential hypertension   . Shoulder joint contracture   . Protein C deficiency ([place])   . Protein S deficiency ([place])   . Femur fracture, right ([place]) 06/12/2014  . History of pulmonary embolus (PE) 06/12/2014    Orientation RESPIRATION BLADDER Height & Weight     Self, Time, Situation, Place  Normal Continent Weight: 180 lb 8.9 oz (81.9 kg) Height:  5\' 2"  (157.5 cm)  BEHAVIORAL SYMPTOMS/MOOD NEUROLOGICAL BOWEL NUTRITION STATUS      Continent    AMBULATORY STATUS COMMUNICATION OF NEEDS Skin   Extensive Assist Verbally Skin abrasions, Bruising, Other (Comment)(dry and flaky.)                       Personal Care Assistance Level of Assistance  Bathing, Dressing, Feeding Bathing Assistance: Limited assistance Feeding assistance: Independent Dressing Assistance: Limited  assistance     Functional Limitations Info  Sight, Speech, Hearing Sight Info: Impaired Hearing Info: Impaired Speech Info: Adequate    SPECIAL CARE FACTORS FREQUENCY  PT (By licensed PT), OT (By licensed OT)     PT Frequency: 3x weekly OT Frequency: 3x weekly            Contractures Contractures Info: Not present    Additional Factors Info  Code Status, Allergies Code Status Info: Full Code Allergies Info: Adhesive (Tape), Penicillins, Tuberculin Tests           Current Medications (08/09/2019):  This is the current hospital active medication list Current Facility-Administered Medications  Medication Dose Route Frequency Provider Last Rate Last Admin  . acetaminophen (TYLENOL) tablet 650 mg  650 mg Oral Q6H Johnson, Clanford L, MD   650 mg at 08/09/19 1135  . amLODipine (NORVASC) tablet 5 mg  5 mg Oral Daily Johnson, Clanford L, MD   5 mg at 08/09/19 [id number]  . Chlorhexidine Gluconate Cloth 2 % PADS 6 each  6 each Topical Daily Wynetta Emery, Clanford L, MD   6 each at 08/09/19 1130  . feeding supplement (ENSURE ENLIVE) (ENSURE ENLIVE) liquid 237 mL  237 mL Oral BID BM Adefeso, Oladapo, DO   237 mL at 08/07/19 1359  . furosemide (LASIX) injection 20 mg  20 mg Intravenous BID Adefeso, Oladapo, DO   20 mg at 08/09/19 [id number]  . MEDLINE mouth rinse  15 mL Mouth Rinse BID Johnson, Clanford L, MD   15 mL at 08/09/19 1130  .  multivitamin with minerals tablet 1 tablet  1 tablet Oral Daily Wynetta Emery, Clanford L, MD   1 tablet at 08/09/19 [id number]  . oxybutynin (DITROPAN-XL) 24 hr tablet 5 mg  5 mg Oral Daily Johnson, Clanford L, MD   5 mg at 08/09/19 [id number]  . pantoprazole (PROTONIX) EC tablet 40 mg  40 mg Oral Daily Adefeso, Oladapo, DO   40 mg at 08/09/19 [id number]  . potassium chloride SA (KLOR-CON) CR tablet 20 mEq  20 mEq Oral TID Wynetta Emery, Clanford L, MD      . senna (SENOKOT) tablet 17.2 mg  2 tablet Oral BID Johnson, Clanford L, MD   17.2 mg at 08/08/19 2143  . warfarin (COUMADIN) tablet 2 mg  2  mg Oral ONCE-1800 Johnson, Eldridge Dace, MD      . Warfarin - Pharmacist Dosing Inpatient   Does not apply q1800 [facility]   Given at 08/08/19 1724     Discharge Medications: Please see discharge summary for a list of discharge medications.  Relevant Imaging Results:  Relevant Lab Results:   Additional Information SS# [id number]  [place], [place]

## 2019-08-09 NOTE — Plan of Care (Signed)
  Problem: Acute Rehab PT Goals(only PT should resolve) Goal: Pt Will Go Supine/Side To Sit Outcome: Progressing Flowsheets (Taken 08/09/2019 1241) Pt will go Supine/Side to Sit:  with minimal assist  with moderate assist Goal: Patient Will Transfer Sit To/From Stand Outcome: Progressing Flowsheets (Taken 08/09/2019 1241) Patient will transfer sit to/from stand:  with minimal assist  with moderate assist Goal: Pt Will Transfer Bed To Chair/Chair To Bed Outcome: Progressing Flowsheets (Taken 08/09/2019 1241) Pt will Transfer Bed to Chair/Chair to Bed:  with min assist  with mod assist Goal: Pt Will Ambulate Outcome: Progressing Flowsheets (Taken 08/09/2019 1241) Pt will Ambulate:  25 feet  with minimal assist  with moderate assist  with cues (comment type and amount)   12:42 PM, 08/09/19 Lonell Grandchild, MPT Physical Therapist with [facility] [phone] office [phone] mobile phone

## 2019-08-09 NOTE — Progress Notes (Signed)
PROGRESS NOTE [facility]   Melissa Lambert  [id number]  DOB: [birth year]  DOA: 08/05/2019 PCP: Monico Blitz, MD   Brief Admission Hx: 85 y.o. female with medical history significant for hypertension, pulmonary embolism, history of protein C and insufficiency who presents to the emergency department due to increased difficulty in being able to ambulate due to 2-3 days onset of leg swelling.  She states that she was recently admitted to [facility] due to pneumonia and COVID-19  MDM/Assessment & Plan:   1. Bilateral lower extremity edema-ultrasound negative for DVT, edema caused by congestive heart failure.  She has an elevated BNP.  She is tolerating IV Lasix.  Continue TED hose.  Follow-up echocardiogram.  Low-sodium diet. 2. Acute diastolic CHF exacerbation - Pt responding well to IV lasix.  Weight is trending down.   3. Chronic hyponatremia / volume overload-sodium is improving with diuresis. 4. Hypoalbuminemia-protein supplement ordered dietitian consultation. 5. Generalized weakness-Pt willing to work with PT today. She is feeling better after diuresis. PT evaluation requested.  6. History of pulmonary embolus and protein C deficiency she is chronically anticoagulated with warfarin but has not been taking consistently and is subtherapeutic and now she is on lovenox pending therapeutic warfarin level. 7. Essential hypertension-she was on amlodipine 7.5 mg daily and it has been reduced to 5 mg given BLE edema.  Amlodipine at these higher doses tends to contribute to the leg edema. 8. GERD-PPI 9. Hypokalemia / hypomagnesemia - repleted. Monitor for refeeding syndrome.    DVT prophylaxis: Lovenox Code Status: Full code Family Communication: None Disposition Plan: SNF when bed available   Consultants:    Procedures:  Echocardiogram IMPRESSIONS  1. Left ventricular ejection fraction, by visual estimation, is 60 to 65%. The left ventricle has normal function.  There is mildly increased left ventricular hypertrophy.  2. Left ventricular diastolic parameters are consistent with Grade I diastolic dysfunction (impaired relaxation). Normal left heart filling pressures.  3. The left ventricle has no regional wall motion abnormalities.  4. Global right ventricle has normal systolic function.The right ventricular size is normal. No increase in right ventricular wall thickness.  5. Left atrial size was severely dilated.  6. Right atrial size was normal.  7. Moderate mitral annular calcification.  8. The mitral valve is normal in structure. No evidence of mitral valve regurgitation.  9. The tricuspid valve is grossly normal. 10. The aortic valve is tricuspid. Aortic valve regurgitation is trivial. Mild aortic valve sclerosis without stenosis. 11. The pulmonic valve was not well visualized. Pulmonic valve regurgitation is not visualized. 12. Normal pulmonary artery systolic pressure. 13. The inferior vena cava is normal in size with greater than 50% respiratory variability, suggesting right atrial pressure of 3 mmHg.  Antimicrobials:     Subjective: Patient reports no shortness of breath or chest pain, she is eating much better and willing to work with PT today.  Reports having arthritis pain in left leg.   Objective: Vitals:   08/08/19 2200 08/08/19 2300 08/09/19 0000 08/09/19 0522  BP: (!) 115/57 (!) 102/49 112/66 (!) 119/58  Pulse: 66 67 68 64  Resp: (!) 22 (!) 22 17 20   Temp:   98 F (36.7 C) 98 F (36.7 C)  TempSrc:   Oral Oral  SpO2: 98% 96% 96% 97%  Weight:   81.9 kg   Height:   5\' 2"  (1.575 m)     Intake/Output Summary (Last 24 hours) at 08/09/2019 1723 Last data filed at 08/09/2019 1000 Gross  per 24 hour  Intake 580 ml  Output --  Net 580 ml   Filed Weights   08/07/19 0354 08/08/19 0500 08/09/19 0000  Weight: 81.3 kg 81.8 kg 81.9 kg   REVIEW OF SYSTEMS  As per history otherwise all reviewed and reported  negative  Exam:  General exam: Elderly female chronically ill-appearing female sitting up in bed eating breakfast. Respiratory system: Shallow breathing. No increased work of breathing. Cardiovascular system: S1 & S2 heard. No JVD, murmurs, gallops, clicks or pedal edema. Gastrointestinal system: Abdomen is nondistended, soft and nontender. Normal bowel sounds heard. Central nervous system: Alert and oriented. No focal neurological deficits. Extremities: trace pretibial edema bilateral lower extremities.  TED hoses in place.  Data Reviewed: Basic Metabolic Panel: Recent Labs  Lab 08/05/19 1736 08/06/19 0707 08/07/19 0423 08/08/19 0500 08/09/19 0545  NA 131* 134* 136 136 136  K 3.3* 3.5 3.3* 3.5 3.3*  CL 99 100 97* 100 97*  CO2 25 25 31 28 29   GLUCOSE 110* 78 130* 109* 95  BUN 15 14 19 23 20   CREATININE 0.49 0.48 0.59 0.58 0.50  CALCIUM 8.6* 8.5* 8.7* 8.4* 8.2*  MG  --  1.8 1.6* 1.9 1.7  PHOS  --  3.6  --  2.7  --    Liver Function Tests: Recent Labs  Lab 08/05/19 1736 08/06/19 0707  AST 32 26  ALT 30 23  ALKPHOS 63 56  BILITOT 1.3* 1.2  PROT 5.7* 5.0*  ALBUMIN 2.7* 2.4*   No results for input(s): LIPASE, AMYLASE in the last 168 hours. No results for input(s): AMMONIA in the last 168 hours. CBC: Recent Labs  Lab 08/05/19 1736 08/06/19 0707 08/07/19 0423 08/08/19 0500 08/09/19 0545  WBC 9.1 7.9 8.3 6.5 4.2  HGB 12.6 12.0 11.5* 9.8* 9.5*  HCT 37.6 36.1 35.3* 30.5* 29.0*  MCV 94.2 96.3 95.4 96.5 98.3  PLT 206 186 194 169 165   Cardiac Enzymes: No results for input(s): CKTOTAL, CKMB, CKMBINDEX, TROPONINI in the last 168 hours. CBG (last 3)  No results for input(s): GLUCAP in the last 72 hours. Recent Results (from the past 240 hour(s))  SARS CORONAVIRUS 2 (TAT 6-24 HRS) Nasopharyngeal Nasopharyngeal Swab     Status: None   Collection Time: 08/05/19  8:17 PM   Specimen: Nasopharyngeal Swab  Result Value Ref Range Status   SARS Coronavirus 2 NEGATIVE  NEGATIVE Final    Comment: (NOTE) SARS-CoV-2 target nucleic acids are NOT DETECTED. The SARS-CoV-2 RNA is generally detectable in upper and lower respiratory specimens during the acute phase of infection. Negative results do not preclude SARS-CoV-2 infection, do not rule out co-infections with other pathogens, and should not be used as the sole basis for treatment or other patient management decisions. Negative results must be combined with clinical observations, patient history, and epidemiological information. The expected result is Negative. Fact Sheet for Patients: SugarRoll.be Fact Sheet for Healthcare Providers: https://www.woods-mathews.com/ This test is not yet approved or cleared by the [place] FDA and  has been authorized for detection and/or diagnosis of SARS-CoV-2 by FDA under an Emergency Use Authorization (EUA). This EUA will remain  in effect (meaning this test can be used) for the duration of the COVID-19 declaration under Section 56 4(b)(1) of the Act, 21 U.S.C. section 360bbb-3(b)(1), unless the authorization is terminated or revoked sooner. Performed at [facility], [place] [address] [place], [place] [address]   MRSA PCR Screening     Status: None  Collection Time: 08/07/19  3:40 AM   Specimen: Nasal Mucosa; Nasopharyngeal  Result Value Ref Range Status   MRSA by PCR NEGATIVE NEGATIVE Final    Comment:        The GeneXpert MRSA Assay (FDA approved for NASAL specimens only), is one component of a comprehensive MRSA colonization surveillance program. It is not intended to diagnose MRSA infection nor to guide or monitor treatment for MRSA infections. Performed at [facility], [address], [place], [place] [address]      Studies: ECHOCARDIOGRAM COMPLETE  Result Date: 08/08/2019   ECHOCARDIOGRAM REPORT   Patient Name:   MOO HOLZMANN Date of Exam: 08/08/2019 Medical Rec #:  [id number]       Height:        62.0 in Accession #:    [id number]      Weight:       180.3 lb Date of Birth:  1928/10/12        BSA:          1.83 m Patient Age:    89 years        BP:           124/58 mmHg Patient Gender: F               HR:           59 bpm. Exam Location:  [facility] Procedure: 2D Echo, Cardiac Doppler and Color Doppler Indications:    Dyspnea 786.09 / R06.00  History:        Patient has no prior history of Echocardiogram examinations.                 Risk Factors:Hypertension. Elevated brain natriuretic peptide                 (BNP) level,Bilateral lower extremity edema,History of pulmonary                 embolus (PE),GERD.  Sonographer:    Melissa Lambert RCS Referring Phys: [place]  1. Left ventricular ejection fraction, by visual estimation, is 60 to 65%. The left ventricle has normal function. There is mildly increased left ventricular hypertrophy.  2. Left ventricular diastolic parameters are consistent with Grade I diastolic dysfunction (impaired relaxation). Normal left heart filling pressures.  3. The left ventricle has no regional wall motion abnormalities.  4. Global right ventricle has normal systolic function.The right ventricular size is normal. No increase in right ventricular wall thickness.  5. Left atrial size was severely dilated.  6. Right atrial size was normal.  7. Moderate mitral annular calcification.  8. The mitral valve is normal in structure. No evidence of mitral valve regurgitation.  9. The tricuspid valve is grossly normal. 10. The aortic valve is tricuspid. Aortic valve regurgitation is trivial. Mild aortic valve sclerosis without stenosis. 11. The pulmonic valve was not well visualized. Pulmonic valve regurgitation is not visualized. 12. Normal pulmonary artery systolic pressure. 13. The inferior vena cava is normal in size with greater than 50% respiratory variability, suggesting right atrial pressure of 3 mmHg. FINDINGS  Left Ventricle: Left ventricular ejection  fraction, by visual estimation, is 60 to 65%. The left ventricle has normal function. The left ventricle has no regional wall motion abnormalities. The left ventricular internal cavity size was the left ventricle is normal in size. There is mildly increased left ventricular hypertrophy. Concentric left ventricular hypertrophy. Left ventricular diastolic parameters are consistent with Grade I diastolic dysfunction (impaired  relaxation). Normal left atrial pressure. Right Ventricle: The right ventricular size is normal. No increase in right ventricular wall thickness. Global RV systolic function is has normal systolic function. The tricuspid regurgitant velocity is 2.27 m/s, and with an assumed right atrial pressure  of 3 mmHg, the estimated right ventricular systolic pressure is normal at 23.6 mmHg. Left Atrium: Left atrial size was severely dilated. Right Atrium: Right atrial size was normal in size Pericardium: There is no evidence of pericardial effusion. Mitral Valve: The mitral valve is normal in structure. Moderate mitral annular calcification. No evidence of mitral valve regurgitation. Tricuspid Valve: The tricuspid valve is grossly normal. Tricuspid valve regurgitation is mild. Aortic Valve: The aortic valve is tricuspid. Aortic valve regurgitation is trivial. Mild aortic valve sclerosis is present, with no evidence of aortic valve stenosis. Aortic valve mean gradient measures 8.0 mmHg. Aortic valve peak gradient measures 17.0 mmHg. Aortic valve area, by VTI measures 1.57 cm. Pulmonic Valve: The pulmonic valve was not well visualized. Pulmonic valve regurgitation is not visualized. Pulmonic regurgitation is not visualized. Aorta: The aortic root is normal in size and structure. Venous: The inferior vena cava is normal in size with greater than 50% respiratory variability, suggesting right atrial pressure of 3 mmHg. IAS/Shunts: No atrial level shunt detected by color flow Doppler.  LEFT VENTRICLE PLAX 2D  LVIDd:         4.14 cm       Diastology LVIDs:         3.04 cm       LV e' lateral:   8.70 cm/s LV PW:         1.03 cm       LV E/e' lateral: 8.5 LV IVS:        1.41 cm       LV e' medial:    8.16 cm/s LVOT diam:     1.70 cm       LV E/e' medial:  9.0 LV SV:         40 ml LV SV Index:   20.59 LVOT Area:     2.27 cm  LV Volumes (MOD) LV area d, A2C:    21.50 cm LV area d, A4C:    21.10 cm LV area s, A2C:    11.20 cm LV area s, A4C:    11.80 cm LV major d, A2C:   6.48 cm LV major d, A4C:   6.28 cm LV major s, A2C:   5.20 cm LV major s, A4C:   5.54 cm LV vol d, MOD A2C: 61.0 ml LV vol d, MOD A4C: 58.4 ml LV vol s, MOD A2C: 22.4 ml LV vol s, MOD A4C: 21.5 ml LV SV MOD A2C:     38.6 ml LV SV MOD A4C:     58.4 ml LV SV MOD BP:      38.1 ml RIGHT VENTRICLE RV S prime:     14.60 cm/s TAPSE (M-mode): 2.4 cm LEFT ATRIUM              Index       RIGHT ATRIUM           Index LA diam:        2.60 cm  1.42 cm/m  RA Area:     15.70 cm LA Vol (A2C):   100.0 ml 54.66 ml/m RA Volume:   34.10 ml  18.64 ml/m LA Vol (A4C):   91.6 ml  50.07 ml/m LA Biplane Vol: 99.5  ml  54.39 ml/m  AORTIC VALVE AV Area (Vmax):    1.47 cm AV Area (Vmean):   1.56 cm AV Area (VTI):     1.57 cm AV Vmax:           206.00 cm/s AV Vmean:          128.000 cm/s AV VTI:            0.399 m AV Peak Grad:      17.0 mmHg AV Mean Grad:      8.0 mmHg LVOT Vmax:         133.00 cm/s LVOT Vmean:        87.800 cm/s LVOT VTI:          0.276 m LVOT/AV VTI ratio: 0.69  AORTA Ao Root diam: 2.90 cm MITRAL VALVE                         TRICUSPID VALVE MV Area (PHT): 2.03 cm              TR Peak grad:   20.6 mmHg MV PHT:        108.17 msec           TR Vmax:        227.00 cm/s MV Decel Time: 373 msec MV E velocity: 73.70 cm/s  103 cm/s  SHUNTS MV A velocity: 106.00 cm/s 70.3 cm/s Systemic VTI:  0.28 m MV E/A ratio:  0.70        1.5       Systemic Diam: 1.70 cm  Melissa Gobble Croitoru MD Electronically signed by Sanda Klein MD Signature Date/Time: 08/08/2019/11:36:06 AM     Final    Scheduled Meds: . acetaminophen  650 mg Oral Q6H  . amLODipine  5 mg Oral Daily  . Chlorhexidine Gluconate Cloth  6 each Topical Daily  . feeding supplement (ENSURE ENLIVE)  237 mL Oral BID BM  . furosemide  20 mg Intravenous BID  . mouth rinse  15 mL Mouth Rinse BID  . multivitamin with minerals  1 tablet Oral Daily  . oxybutynin  5 mg Oral Daily  . pantoprazole  40 mg Oral Daily  . potassium chloride  20 mEq Oral TID  . senna  2 tablet Oral BID  . warfarin  2 mg Oral ONCE-1800  . Warfarin - Pharmacist Dosing Inpatient   Does not apply q1800   Continuous Infusions:  Principal Problem:   Adult failure to thrive Active Problems:   History of pulmonary embolus (PE)   Essential hypertension   Protein C deficiency (HCC)   Elevated brain natriuretic peptide (BNP) level   GERD (gastroesophageal reflux disease)   Hypoalbuminemia   Hyponatremia   Generalized weakness   Bilateral lower extremity edema   Pressure injury of skin  Time spent:   Irwin Brakeman, MD [facility] 08/09/2019, 5:23 PM    LOS: 3 days  How to contact the [facility] Attending or Consulting provider [place] or covering provider during after hours [place], for this patient?  1. Check the care team in [facility] and look for a) attending/consulting TR[facility] provider listed and b) the [facility] Hights team listed 2. Log into [email or web address] and use [place]'s universal password to access. If you do not have the password, please contact the hospital operator. 3. Locate the [facility] provider you are looking for under [facility] and page to a number that you can be directly reached. 4. If  you still have difficulty reaching the provider, please page the [facility] (Director on Call) for the Hospitalists listed on amion for assistance.

## 2019-08-10 LAB — BASIC METABOLIC PANEL
Anion gap: 9 (ref 5–15)
BUN: 20 mg/dL (ref 8–23)
CO2: 30 mmol/L (ref 22–32)
Calcium: 8.3 mg/dL — ABNORMAL LOW (ref 8.9–10.3)
Chloride: 97 mmol/L — ABNORMAL LOW (ref 98–111)
Creatinine, Ser: 0.52 mg/dL (ref 0.44–1.00)
GFR calc Af Amer: >60 mL/min (ref >60)
GFR calc non Af Amer: >60 mL/min (ref >60)
Glucose, Bld: 104 mg/dL — ABNORMAL HIGH (ref 70–99)
Potassium: 4 mmol/L (ref 3.5–5.1)
Sodium: 136 mmol/L (ref 135–145)

## 2019-08-10 LAB — PROTIME-INR
INR: 2.3 — ABNORMAL HIGH (ref 0.8–1.2)
Prothrombin Time: 25 seconds — ABNORMAL HIGH (ref 11.4–15.2)

## 2019-08-10 LAB — CBC
HCT: 31 % — ABNORMAL LOW (ref 36.0–46.0)
Hemoglobin: 9.8 g/dL — ABNORMAL LOW (ref 12.0–15.0)
MCH: 31 pg (ref 26.0–34.0)
MCHC: 31.6 g/dL (ref 30.0–36.0)
MCV: 98.1 fL (ref 80.0–100.0)
Platelets: 183 10*3/uL (ref 150–400)
RBC: 3.16 MIL/uL — ABNORMAL LOW (ref 3.87–5.11)
RDW: 15.2 % (ref 11.5–15.5)
WBC: 6.1 10*3/uL (ref 4.0–10.5)
nRBC: 0 % (ref 0.0–0.2)

## 2019-08-10 LAB — RESPIRATORY PANEL BY RT PCR (FLU A&B, COVID)
Influenza A by PCR: NEGATIVE
Influenza B by PCR: NEGATIVE
SARS Coronavirus 2 by RT PCR: NEGATIVE

## 2019-08-10 LAB — MAGNESIUM: Magnesium: 1.9 mg/dL (ref 1.7–2.4)

## 2019-08-10 MED ORDER — WARFARIN SODIUM 5 MG PO TABS
5.0000 mg | ORAL_TABLET | Freq: Once | ORAL | Status: AC
  Administered 2019-08-10: 5 mg via ORAL
  Filled 2019-08-10: qty 1

## 2019-08-10 MED ORDER — POTASSIUM CHLORIDE CRYS ER 20 MEQ PO TBCR
20.0000 meq | EXTENDED_RELEASE_TABLET | Freq: Every day | ORAL | Status: DC
  Administered 2019-08-11 – 2019-08-12 (×2): 20 meq via ORAL
  Filled 2019-08-10 (×3): qty 1

## 2019-08-10 MED ORDER — TAMSULOSIN HCL 0.4 MG PO CAPS
0.4000 mg | ORAL_CAPSULE | Freq: Every day | ORAL | Status: DC
  Administered 2019-08-10 – 2019-08-11 (×2): 0.4 mg via ORAL
  Filled 2019-08-10 (×2): qty 1

## 2019-08-10 NOTE — Progress Notes (Signed)
PROGRESS NOTE [facility] CAMPUS   Melissa Lambert  [id number]  DOB: 1928/09/27  DOA: 08/05/2019 PCP: Monico Blitz, MD   Brief Admission Hx: 85 y.o. female with medical history significant for hypertension, pulmonary embolism, history of protein C and insufficiency who presents to the emergency department due to increased difficulty in being able to ambulate due to 2-3 days onset of leg swelling.  She states that she was recently admitted to [facility] due to pneumonia and COVID-19  MDM/Assessment & Plan:   1. Bilateral lower extremity edema-IMPROVING, ultrasound negative for DVT, edema caused by congestive heart failure.  She has an elevated BNP.  She is tolerating IV Lasix.  Continue TED hose.  Follow-up echocardiogram.  Low-sodium diet. 2. Acute diastolic CHF exacerbation - Pt responding well to IV lasix.  Weight is trending down.   3. Chronic hyponatremia / volume overload-sodium is improving with diuresis. 4. Hypoalbuminemia-protein supplement ordered dietitian consultation. 5. Generalized weakness-Pt willing to work with PT today. She is feeling better after diuresis. PT evaluation requested.  6. History of pulmonary embolus and protein C deficiency she is chronically anticoagulated with warfarin but has not been taking consistently and is subtherapeutic and now she is on lovenox pending therapeutic warfarin level. 7. Essential hypertension-she was on amlodipine 7.5 mg daily and it has been reduced to 5 mg given BLE edema.  Amlodipine at these higher doses tends to contribute to the leg edema. 8. GERD-PPI 9. Hypokalemia / hypomagnesemia - repleted. Monitor for refeeding syndrome.    DVT prophylaxis: Lovenox Code Status: Full code Family Communication: None Disposition Plan: SNF when bed available   Consultants:    Procedures:  Echocardiogram IMPRESSIONS  1. Left ventricular ejection fraction, by visual estimation, is 60 to 65%. The left ventricle has  normal function. There is mildly increased left ventricular hypertrophy.  2. Left ventricular diastolic parameters are consistent with Grade I diastolic dysfunction (impaired relaxation). Normal left heart filling pressures.  3. The left ventricle has no regional wall motion abnormalities.  4. Global right ventricle has normal systolic function.The right ventricular size is normal. No increase in right ventricular wall thickness.  5. Left atrial size was severely dilated.  6. Right atrial size was normal.  7. Moderate mitral annular calcification.  8. The mitral valve is normal in structure. No evidence of mitral valve regurgitation.  9. The tricuspid valve is grossly normal. 10. The aortic valve is tricuspid. Aortic valve regurgitation is trivial. Mild aortic valve sclerosis without stenosis. 11. The pulmonic valve was not well visualized. Pulmonic valve regurgitation is not visualized. 12. Normal pulmonary artery systolic pressure. 13. The inferior vena cava is normal in size with greater than 50% respiratory variability, suggesting right atrial pressure of 3 mmHg.  Antimicrobials:     Subjective: Patient reports no complaints today.   Objective: Vitals:   08/09/19 0000 08/09/19 0522 08/09/19 2053 08/10/19 0508  BP: 112/66 (!) 119/58 128/62 (!) 119/58  Pulse: 68 64 73 67  Resp: 17 20 20 20   Temp: 98 F (36.7 C) 98 F (36.7 C) 98.8 F (37.1 C) 98.4 F (36.9 C)  TempSrc: Oral Oral Oral Oral  SpO2: 96% 97% 94% 97%  Weight: 81.9 kg     Height: 5\' 2"  (1.575 m)       Intake/Output Summary (Last 24 hours) at 08/10/2019 1530 Last data filed at 08/10/2019 0500 Gross per 24 hour  Intake 480 ml  Output 600 ml  Net -120 ml   [employer]  08/07/19 0354 08/08/19 0500 08/09/19 0000  Weight: 81.3 kg 81.8 kg 81.9 kg   REVIEW OF SYSTEMS  As per history otherwise all reviewed and reported negative  Exam:  General exam: Elderly female chronically ill-appearing female sitting up  in bed eating breakfast. Respiratory system: Shallow breathing. No increased work of breathing. Cardiovascular system: S1 & S2 heard. No JVD, murmurs, gallops, clicks or pedal edema. Gastrointestinal system: Abdomen is nondistended, soft and nontender. Normal bowel sounds heard. Central nervous system: Alert and oriented. No focal neurological deficits. Extremities: trace pretibial edema bilateral lower extremities.  TED hoses in place.  Data Reviewed: Basic Metabolic Panel: Recent Labs  Lab 08/06/19 0707 08/07/19 0423 08/08/19 0500 08/09/19 0545 08/10/19 0644  NA 134* 136 136 136 136  K 3.5 3.3* 3.5 3.3* 4.0  CL 100 97* 100 97* 97*  CO2 25 31 28 29 30   GLUCOSE 78 130* 109* 95 104*  BUN 14 19 23 20 20   CREATININE 0.48 0.59 0.58 0.50 0.52  CALCIUM 8.5* 8.7* 8.4* 8.2* 8.3*  MG 1.8 1.6* 1.9 1.7 1.9  PHOS 3.6  --  2.7  --   --    Liver Function Tests: Recent Labs  Lab 08/05/19 1736 08/06/19 0707  AST 32 26  ALT 30 23  ALKPHOS 63 56  BILITOT 1.3* 1.2  PROT 5.7* 5.0*  ALBUMIN 2.7* 2.4*   No results for input(s): LIPASE, AMYLASE in the last 168 hours. No results for input(s): AMMONIA in the last 168 hours. CBC: Recent Labs  Lab 08/06/19 0707 08/07/19 0423 08/08/19 0500 08/09/19 0545 08/10/19 0644  WBC 7.9 8.3 6.5 4.2 6.1  HGB 12.0 11.5* 9.8* 9.5* 9.8*  HCT 36.1 35.3* 30.5* 29.0* 31.0*  MCV 96.3 95.4 96.5 98.3 98.1  PLT 186 194 169 165 183   Cardiac Enzymes: No results for input(s): CKTOTAL, CKMB, CKMBINDEX, TROPONINI in the last 168 hours. CBG (last 3)  No results for input(s): GLUCAP in the last 72 hours. Recent Results (from the past 240 hour(s))  SARS CORONAVIRUS 2 (TAT 6-24 HRS) Nasopharyngeal Nasopharyngeal Swab     Status: None   Collection Time: 08/05/19  8:17 PM   Specimen: Nasopharyngeal Swab  Result Value Ref Range Status   SARS Coronavirus 2 NEGATIVE NEGATIVE Final    Comment: (NOTE) SARS-CoV-2 target nucleic acids are NOT DETECTED. The  SARS-CoV-2 RNA is generally detectable in upper and lower respiratory specimens during the acute phase of infection. Negative results do not preclude SARS-CoV-2 infection, do not rule out co-infections with other pathogens, and should not be used as the sole basis for treatment or other patient management decisions. Negative results must be combined with clinical observations, patient history, and epidemiological information. The expected result is Negative. Fact Sheet for Patients: SugarRoll.be Fact Sheet for Healthcare Providers: https://www.woods-mathews.com/ This test is not yet approved or cleared by the [place] FDA and  has been authorized for detection and/or diagnosis of SARS-CoV-2 by FDA under an Emergency Use Authorization (EUA). This EUA will remain  in effect (meaning this test can be used) for the duration of the COVID-19 declaration under Section 56 4(b)(1) of the Act, 21 U.S.C. section 360bbb-3(b)(1), unless the authorization is terminated or revoked sooner. Performed at [facility], [place] [address], [place], [place] [address]   MRSA PCR Screening     Status: None   Collection Time: 08/07/19  3:40 AM   Specimen: Nasal Mucosa; Nasopharyngeal  Result Value Ref Range Status   MRSA by  PCR NEGATIVE NEGATIVE Final    Comment:        The GeneXpert MRSA Assay (FDA approved for NASAL specimens only), is one component of a comprehensive MRSA colonization surveillance program. It is not intended to diagnose MRSA infection nor to guide or monitor treatment for MRSA infections. Performed at [facility], [address], [place], [place] [address]   Respiratory Panel by RT PCR (Flu A&B, Covid) - Nasopharyngeal Swab     Status: None   Collection Time: 08/10/19 11:14 AM   Specimen: Nasopharyngeal Swab  Result Value Ref Range Status   SARS Coronavirus 2 by RT PCR NEGATIVE NEGATIVE Final    Comment: (NOTE) SARS-CoV-2 target  nucleic acids are NOT DETECTED. The SARS-CoV-2 RNA is generally detectable in upper respiratoy specimens during the acute phase of infection. The lowest concentration of SARS-CoV-2 viral copies this assay can detect is 131 copies/mL. A negative result does not preclude SARS-Cov-2 infection and should not be used as the sole basis for treatment or other patient management decisions. A negative result may occur with  improper specimen collection/handling, submission of specimen other than nasopharyngeal swab, presence of viral mutation(s) within the areas targeted by this assay, and inadequate number of viral copies (<131 copies/mL). A negative result must be combined with clinical observations, patient history, and epidemiological information. The expected result is Negative. Fact Sheet for Patients:  PinkCheek.be Fact Sheet for Healthcare Providers:  GravelBags.it This test is not yet ap proved or cleared by the [place] FDA and  has been authorized for detection and/or diagnosis of SARS-CoV-2 by FDA under an Emergency Use Authorization (EUA). This EUA will remain  in effect (meaning this test can be used) for the duration of the COVID-19 declaration under Section 564(b)(1) of the Act, 21 U.S.C. section 360bbb-3(b)(1), unless the authorization is terminated or revoked sooner.    Influenza A by PCR NEGATIVE NEGATIVE Final   Influenza B by PCR NEGATIVE NEGATIVE Final    Comment: (NOTE) The Xpert Xpress SARS-CoV-2/FLU/RSV assay is intended as an aid in  the diagnosis of influenza from Nasopharyngeal swab specimens and  should not be used as a sole basis for treatment. Nasal washings and  aspirates are unacceptable for Xpert Xpress SARS-CoV-2/FLU/RSV  testing. Fact Sheet for Patients: PinkCheek.be Fact Sheet for Healthcare Providers: GravelBags.it This test is not  yet approved or cleared by the [place] FDA and  has been authorized for detection and/or diagnosis of SARS-CoV-2 by  FDA under an Emergency Use Authorization (EUA). This EUA will remain  in effect (meaning this test can be used) for the duration of the  Covid-19 declaration under Section 564(b)(1) of the Act, 21  U.S.C. section 360bbb-3(b)(1), unless the authorization is  terminated or revoked. Performed at [facility], [address], [place], [place] [address]      Studies: No results found. Scheduled Meds: . acetaminophen  650 mg Oral Q6H  . amLODipine  5 mg Oral Daily  . Chlorhexidine Gluconate Cloth  6 each Topical Daily  . feeding supplement (ENSURE ENLIVE)  237 mL Oral BID BM  . furosemide  20 mg Intravenous BID  . mouth rinse  15 mL Mouth Rinse BID  . multivitamin with minerals  1 tablet Oral Daily  . oxybutynin  5 mg Oral Daily  . pantoprazole  40 mg Oral Daily  . potassium chloride  20 mEq Oral TID  . senna  2 tablet Oral BID  . warfarin  5 mg Oral ONCE-1800  .  Warfarin - Pharmacist Dosing Inpatient   Does not apply q1800   Continuous Infusions:  Principal Problem:   Adult failure to thrive Active Problems:   History of pulmonary embolus (PE)   Essential hypertension   Protein C deficiency (HCC)   Elevated brain natriuretic peptide (BNP) level   GERD (gastroesophageal reflux disease)   Hypoalbuminemia   Hyponatremia   Generalized weakness   Bilateral lower extremity edema   Pressure injury of skin  Time spent:   Irwin Brakeman, MD [facility] 08/10/2019, 3:29 PM    LOS: 4 days  How to contact the [facility] Attending or Consulting provider [place] or covering provider during after hours [place], for this patient?  1. Check the care team in [facility] and look for a) attending/consulting TRH provider listed and b) the [facility] team listed 2. Log into [email or web address] and use 's universal password to access. If you do not have the password, please  contact the hospital operator. 3. Locate the [facility] provider you are looking for under [facility] and page to a number that you can be directly reached. 4. If you still have difficulty reaching the provider, please page the [facility] (Director on Call) for the Hospitalists listed on amion for assistance.

## 2019-08-10 NOTE — TOC Initial Note (Signed)
Transition of Care [facility]) - Initial/Assessment Note    Patient Details  Name: Melissa Lambert MRN: [id number] Date of Birth: 19-May-1929  Transition of Care [facility]) CM/SW Contact:    Melissa Lambert Phone Number: 08/10/2019, 10:11 AM  Clinical Narrative: CSW at bedside to conduct TOC assessment. Pt reports that she lives at home with the support of relatives.  Pt has a history of SNF placement at [facility]. Pt explains that that location is her preferred placement option. Patient states that her sister also is resident at [facility].   Pt reports that she uses a cane to assist with ambulation.  CSW later in contact with Melissa Lambert at [facility] who states that it is unlikely that they are going to accept her as she tested positive for COVID in the past.   TOC team will continue to follow patient for discharge related needs [facility] Transitions of Care  Clinical Social Worker  Ph: [phone]              Expected Discharge Plan: Skilled Nursing Facility Barriers to Discharge: Continued Medical Work up, SNF Pending bed offer   Patient Goals and CMS Choice Patient states their goals for this hospitalization and ongoing recovery are:: to discharge to SNF for short term rehab and to get stronger CMS Medicare.gov Compare Post Acute Care list provided to:: Patient Choice offered to / list presented to : Patient  Expected Discharge Plan and Services Expected Discharge Plan: [place] In-house Referral: Clinical Social Work   Post Acute Care Choice: [facility]                                        Prior Living Arrangements/Services     Patient language and need for interpreter reviewed:: Yes Do you feel safe going back to the place where you live?: Yes      Need for Family Participation in Patient Care: No (Comment) Care giver support system in place?: No (comment) Current home services: DME Criminal Activity/Legal  Involvement Pertinent to Current Situation/Hospitalization: No - Comment as needed  Activities of Daily Living Home Assistive Devices/Equipment: Cane (specify quad or straight), Walker (specify type) ADL Screening (condition at time of admission) Patient's cognitive ability adequate to safely complete daily activities?: Yes Is the patient deaf or have difficulty hearing?: Yes Does the patient have difficulty seeing, even when wearing glasses/contacts?: No Does the patient have difficulty concentrating, remembering, or making decisions?: Yes Patient able to express need for assistance with ADLs?: Yes Does the patient have difficulty dressing or bathing?: No Independently performs ADLs?: Yes (appropriate for developmental age) Does the patient have difficulty walking or climbing stairs?: No Weakness of Legs: None Weakness of Arms/Hands: None  Permission Sought/Granted Permission sought to share information with : Case Manager Permission granted to share information with : Yes, Verbal Permission Granted  Share Information with NAME: Melissa Lambert granted to share info w Relationship: Sister  Permission granted to share info w Contact Information: [phone]  Emotional Assessment Appearance:: Appears stated age Attitude/Demeanor/Rapport: Engaged Affect (typically observed): Accepting, Stable Orientation: : Oriented to Self, Oriented to Place, Oriented to  Time, Oriented to Situation Alcohol / Substance Use: Not Applicable Psych Involvement: No (comment)  Admission diagnosis:  Adult failure to thrive [R62.7] Peripheral edema [R60.9] Pneumonia [J18.9] Leg swelling [M79.89] Generalized weakness [R53.1] Bilateral lower extremity  edema [R60.0] Patient Active Problem List   Diagnosis Date Noted  . Pressure injury of skin 08/07/2019  . Elevated brain natriuretic peptide (BNP) level 08/06/2019  . GERD (gastroesophageal reflux disease) 08/06/2019  . Hypoalbuminemia  08/06/2019  . Hyponatremia 08/06/2019  . Generalized weakness 08/06/2019  . Bilateral lower extremity edema 08/06/2019  . Adult failure to thrive 08/05/2019  . Hip fracture ([place]) 06/16/2014  . Essential hypertension   . Shoulder joint contracture   . Protein C deficiency ([place])   . Protein S deficiency ([place])   . Femur fracture, right ([place]) 06/12/2014  . History of pulmonary embolus (PE) 06/12/2014   PCP:  Melissa Blitz, MD Pharmacy:   [facility], [place] [id number] [place] [id number] Phone: [phone] Fax: [phone]     Social Determinants of Health (SDOH) Interventions    Readmission Risk Interventions No flowsheet data found.

## 2019-08-10 NOTE — Progress Notes (Addendum)
ANTICOAGULATION CONSULT NOTE -  Pharmacy Consult for Coumadin Indication: h/o PE, protein C&S deficiency  Allergies  Allergen Reactions  . Adhesive [Tape] Itching  . Penicillins Itching, Swelling and Rash    Streptomycin also  . Tuberculin Tests Itching, Swelling and Rash    Patient Measurements: Height: 5\' 2"  (157.5 cm) Weight: 180 lb 8.9 oz (81.9 kg) IBW/kg (Calculated) : 50.1  Vital Signs: Temp: 98.4 F (36.9 C) (12/29 0508) Temp Source: Oral (12/29 0508) BP: 119/58 (12/29 0508) Pulse Rate: 67 (12/29 0508)  Labs: Recent Labs    08/08/19 0500 08/09/19 0545 08/10/19 0644  HGB 9.8* 9.5* 9.8*  HCT 30.5* 29.0* 31.0*  PLT 169 165 183  LABPROT 20.3* 26.1* 25.0*  INR 1.7* 2.4* 2.3*  CREATININE 0.58 0.50 0.52    Estimated Creatinine Clearance: 48.2 mL/min (by C-G formula based on SCr of 0.52 mg/dL).   Medical History: Past Medical History:  Diagnosis Date  . Asthma   . Hypertension   . PE (pulmonary embolism)   . Protein C deficiency ([place])   . Protein S deficiency ([place])     Medications:  Awaiting electronic med rec  Assessment: 85 y.o. F presents with failure to thrive. Pt on coumadin PTA for h/o PE, protein C & S deficiency.   INR 2.3 Home dose is 5mg /day.   Goal of Therapy:  INR 2-3 Monitor platelets by anticoagulation protocol: Yes   Plan:  Warfarin 5 mg PO x 1  Monitor daily INR and s/s of bleeding.  Margot Ables, PharmD Clinical Pharmacist 08/10/2019 8:49 AM

## 2019-08-10 NOTE — TOC Transition Note (Signed)
Transition of Care [facility]) - CM/SW Discharge Note   Patient Details  Name: Melissa Lambert MRN: [id number] Date of Birth: September 21, 1928  Transition [facility] Care [facility]) CM/SW Contact:  Fitz Matsuo, Chauncey Reading, RN Phone Number: 08/10/2019, 2:08 PM   Clinical Narrative:   Discussed bed offers with patient as well as CMS ratings of facilities. She is reluctant to go anywhere other than [facility], as she is familiar with them.   Patient only elects [facility], they will have a bed tomorrow 12/30.     Final next level of care: [place] Barriers to Discharge: Continued Medical Work up, SNF Pending bed offer   Patient Goals and CMS Choice Patient states their goals for this hospitalization and ongoing recovery are:: to discharge to SNF for short term rehab and to get stronger CMS Medicare.gov Compare Post Acute Care list provided to:: Patient Choice offered to / list presented to : Patient  Discharge Placement              Patient chooses bed at: [facility] Patient to be transferred to facility by: [facility]   Patient and family notified of of transfer: 08/10/19  Discharge Plan and Services In-house Referral: Clinical Social Work   Post Acute Care Choice: [place]                               Social Determinants of Health (SDOH) Interventions     Readmission Risk Interventions No flowsheet data found.

## 2019-08-11 LAB — CBC
HCT: 26.4 % — ABNORMAL LOW (ref 36.0–46.0)
Hemoglobin: 8.4 g/dL — ABNORMAL LOW (ref 12.0–15.0)
MCH: 31.5 pg (ref 26.0–34.0)
MCHC: 31.8 g/dL (ref 30.0–36.0)
MCV: 98.9 fL (ref 80.0–100.0)
Platelets: 175 10*3/uL (ref 150–400)
RBC: 2.67 MIL/uL — ABNORMAL LOW (ref 3.87–5.11)
RDW: 15.2 % (ref 11.5–15.5)
WBC: 4.6 10*3/uL (ref 4.0–10.5)
nRBC: 0 % (ref 0.0–0.2)

## 2019-08-11 LAB — BASIC METABOLIC PANEL
Anion gap: 8 (ref 5–15)
BUN: 16 mg/dL (ref 8–23)
CO2: 31 mmol/L (ref 22–32)
Calcium: 8.1 mg/dL — ABNORMAL LOW (ref 8.9–10.3)
Chloride: 96 mmol/L — ABNORMAL LOW (ref 98–111)
Creatinine, Ser: 0.47 mg/dL (ref 0.44–1.00)
GFR calc Af Amer: >60 mL/min (ref >60)
GFR calc non Af Amer: >60 mL/min (ref >60)
Glucose, Bld: 92 mg/dL (ref 70–99)
Potassium: 3.8 mmol/L (ref 3.5–5.1)
Sodium: 135 mmol/L (ref 135–145)

## 2019-08-11 LAB — PROTIME-INR
INR: 3 — ABNORMAL HIGH (ref 0.8–1.2)
Prothrombin Time: 31.5 seconds — ABNORMAL HIGH (ref 11.4–15.2)

## 2019-08-11 MED ORDER — AMLODIPINE BESYLATE 5 MG PO TABS: 5.0000 mg | ORAL_TABLET | Freq: Every day | ORAL | Status: AC

## 2019-08-11 MED ORDER — POTASSIUM CHLORIDE CRYS ER 10 MEQ PO TBCR: 10.0000 meq | EXTENDED_RELEASE_TABLET | Freq: Every day | ORAL | Status: AC

## 2019-08-11 MED ORDER — TAMSULOSIN HCL 0.4 MG PO CAPS: 0.4000 mg | ORAL_CAPSULE | Freq: Every day | ORAL | Status: AC

## 2019-08-11 MED ORDER — ENSURE ENLIVE PO LIQD: 237.0000 mL | Freq: Two times a day (BID) | ORAL | 12 refills | Status: AC

## 2019-08-11 MED ORDER — ACETAMINOPHEN 325 MG PO TABS: 650.0000 mg | ORAL_TABLET | Freq: Three times a day (TID) | ORAL | Status: AC

## 2019-08-11 MED ORDER — FUROSEMIDE 20 MG PO TABS: 20.0000 mg | ORAL_TABLET | Freq: Every day | ORAL | Status: AC

## 2019-08-11 MED ORDER — WARFARIN SODIUM 2.5 MG PO TABS: 2.5000 mg | ORAL_TABLET | Freq: Every day | ORAL | Status: AC

## 2019-08-11 MED ORDER — OXYBUTYNIN CHLORIDE ER 5 MG PO TB24: 5.0000 mg | ORAL_TABLET | Freq: Every day | ORAL | 1 refills | Status: AC

## 2019-08-11 MED ORDER — ADULT MULTIVITAMIN W/MINERALS CH: 1.0000 | ORAL_TABLET | Freq: Every day | ORAL | Status: AC

## 2019-08-11 NOTE — Progress Notes (Signed)
ANTICOAGULATION CONSULT NOTE -  Pharmacy Consult for Coumadin Indication: h/o PE, protein C&S deficiency  Allergies  Allergen Reactions  . Adhesive [Tape] Itching  . Penicillins Itching, Swelling and Rash    Streptomycin also  . Tuberculin Tests Itching, Swelling and Rash    Patient Measurements: Height: 5\' 2"  (157.5 cm) Weight: 180 lb 8.9 oz (81.9 kg) IBW/kg (Calculated) : 50.1  Vital Signs: Temp: 98.4 F (36.9 C) (12/30 0608) Temp Source: Oral (12/30 [id number]) BP: 104/52 (12/30 0608) Pulse Rate: 65 (12/30 0608)  Labs: Recent Labs    08/09/19 0545 08/10/19 0644 08/11/19 0603  HGB 9.5* 9.8* 8.4*  HCT 29.0* 31.0* 26.4*  PLT 165 183 175  LABPROT 26.1* 25.0* 31.5*  INR 2.4* 2.3* 3.0*  CREATININE 0.50 0.52  --     Estimated Creatinine Clearance: 48.2 mL/min (by C-G formula based on SCr of 0.52 mg/dL).   Medical History: Past Medical History:  Diagnosis Date  . Asthma   . Hypertension   . PE (pulmonary embolism)   . Protein C deficiency ([place])   . Protein S deficiency ([place])     Medications:  Awaiting electronic med rec  Assessment: 85 y.o. F presents with failure to thrive. Pt on coumadin PTA for h/o PE, protein C & S deficiency.   INR 3.0 Home dose is 5mg /day. Patient's INR keeps increasing on home dose- may be more around 2.5 mg daily  Goal of Therapy:  INR 2-3 Monitor platelets by anticoagulation protocol: Yes   Plan:  Hold warfarin x 1 dose due to large increase in INR. Monitor daily INR and s/s of bleeding.  Margot Ables, PharmD Clinical Pharmacist 08/11/2019 7:53 AM
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
## 2019-08-11 NOTE — Discharge Summary (Addendum)
Physician Discharge Summary  Melissa Lambert [id number] DOB: December 07, 1928 DOA: 08/05/2019  PCP: Monico Blitz, MD  Admit date: 08/05/2019 Discharge date: 08/12/2019  Admitted From:  HOME  Disposition:  SNF   Recommendations for Outpatient Follow-up:  1. Follow up with PCP in 2 weeks 2. Please obtain BMP/CBC in one week 3. PLEASE MONITOR PT/INR on WARFARIN, please check PT/INR by 08/13/19 4. Please follow up with [facility] in 1-2 weeks for foley removal, voiding trial  Discharge Condition: STABLE   CODE STATUS: FULL    Brief Hospitalization Summary: Please see all hospital notes, images, labs for full details of the hospitalization. ADMISSION HPI: Melissa Lambert is an 85 y.o. female with medical history significant for hypertension, pulmonary embolism, history of protein C and insufficiency who presents to the emergency department due to increased difficulty in being able to ambulate due to 2-3 days onset of leg swelling.  She states that she was recently admitted to [facility] due to pneumonia and COVID-19, she was discharged a week ago to a skilled nursing facility but she refused, so she went home and she lives alone.  Patient states that she ambulates with a cane and occasionally with a walker at baseline, but she has not been able to move around within last 2 days due to increased leg swelling.  Patient states that it has been more difficult to care for self since onset of her symptoms.  She denies chest pain, shortness of breath, fever, chills, nausea, vomiting, diarrhea.  ED Course:  In the emergency department, BP was 179/93, and other vital signs were within normal range.  Work-up in the ED showed normal CBC and BMP except for hyponatremia, hypoalbuminemia.  BNP was elevated at 335.  Bilateral lower extremity venous Doppler ultrasound of DVT in either lower extremity, but showed extensive soft tissue edema.  Chest x-ray showed near complete resolution of  multifocal airspace and groundglass opacities since the most recent prior study consistent with resolving viral pneumonia.  IV Lasix 20 Mg x1 was given.  Hospitalist was asked to admit patient for further evaluation and management.  Brief Admission Hx: 84 y.o.femalewith medical history significant forhypertension, pulmonary embolism, history of protein C and insufficiency who presents to the emergency department due to increased difficulty in being able to ambulate due to 2-3 days onset of leg swelling.  She states that she was recently admitted to [facility] due to pneumonia and COVID-19  MDM/Assessment & Plan:   1. Bilateral lower extremity edema-IMPROVED, ultrasound negative for DVT, edema caused by congestive heart failure.  She has an elevated BNP.  She is tolerating IV Lasix.  Continue TED hose.  Follow-up echocardiogram with grade 1 DD preserved EF 60-65%.  Low-sodium diet recommended. 2. Acute diastolic CHF exacerbation - Pt responded well to IV lasix.  Weight is trending down.  Lasix 20 mg po daily plus Kdur 10 meq daily ordered.  3. Chronic hyponatremia / volume overload-sodium is improving with diuresis. 4. Hypoalbuminemia-protein supplement ordered dietitian consultation. 5. Generalized weakness- She is feeling better after diuresis. PT recommending SNF.  6. History of pulmonary embolus and protein C deficiency she is chronically anticoagulated with warfarin but has not been taking consistently and is subtherapeutic and now she is on lovenox pending therapeutic warfarin level. Pt will discharge on warfarin 2.5 mg daily starting 08/12/19 7. Essential hypertension-she was on amlodipine 5 mg daily with good control.  Amlodipine at higher doses tends to contribute to the leg edema. 8. GERD-PPI  was given.  9. Hypokalemia / hypomagnesemia - repleted. Monitor for refeeding syndrome.   10. Urinary retention - required multiple in/out caths in hospital, started on flomax, foley  placed, follow up with [facility] in 1-2 weeks for foley removal and voiding trial.   DVT prophylaxis: Lovenox Code Status: Full code Family Communication: patient updated at bedside Disposition Plan: SNF  Consultants:    Procedures:  Echocardiogram IMPRESSIONS 1. Left ventricular ejection fraction, by visual estimation, is 60 to 65%. The left ventricle has normal function. There is mildly increased left ventricular hypertrophy. 2. Left ventricular diastolic parameters are consistent with Grade I diastolic dysfunction (impaired relaxation). Normal left heart filling pressures. 3. The left ventricle has no regional wall motion abnormalities. 4. Global right ventricle has normal systolic function.The right ventricular size is normal. No increase in right ventricular wall thickness. 5. Left atrial size was severely dilated. 6. Right atrial size was normal. 7. Moderate mitral annular calcification. 8. The mitral valve is normal in structure. No evidence of mitral valve regurgitation. 9. The tricuspid valve is grossly normal. 10. The aortic valve is tricuspid. Aortic valve regurgitation is trivial. Mild aortic valve sclerosis without stenosis. 11. The pulmonic valve was not well visualized. Pulmonic valve regurgitation is not visualized. 12. Normal pulmonary artery systolic pressure. 13. The inferior vena cava is normal in size with greater than 50% respiratory variability, suggesting right atrial pressure of 3 mmHg.  Antimicrobials:    Discharge Diagnoses:  Principal Problem:   Adult failure to thrive Active Problems:   History of pulmonary embolus (PE)   Essential hypertension   Protein C deficiency (HCC)   Elevated brain natriuretic peptide (BNP) level   GERD (gastroesophageal reflux disease)   Hypoalbuminemia   Hyponatremia   Generalized weakness   Bilateral lower extremity edema   Pressure injury of skin   Urinary retention   Discharge  Instructions:  Allergies as of 08/12/2019      Reactions   Adhesive [tape] Itching   Penicillins Itching, Swelling, Rash   Streptomycin also   Tuberculin Tests Itching, Swelling, Rash      Medication List    TAKE these medications   acetaminophen 325 MG tablet Commonly known as: TYLENOL Take 2 tablets (650 mg total) by mouth 3 (three) times daily.   amLODipine 5 MG tablet Commonly known as: NORVASC Take 1 tablet (5 mg total) by mouth daily. 5mg  in the morning and 2.5mg  at night What changed:   medication strength  how much to take  when to take this   Caltrate 600+D Plus Minerals 600-800 MG-UNIT Tabs Take 1 tablet by mouth 2 (two) times daily.   feeding supplement (ENSURE ENLIVE) Liqd Take 237 mLs by mouth 2 (two) times daily between meals.   furosemide 20 MG tablet Commonly known as: LASIX Take 1 tablet (20 mg total) by mouth daily.   multivitamin with minerals Tabs tablet Take 1 tablet by mouth daily.   oxybutynin 5 MG 24 hr tablet Commonly known as: DITROPAN-XL Take 1 tablet (5 mg total) by mouth daily.   potassium chloride 10 MEQ tablet Commonly known as: KLOR-CON Take 1 tablet (10 mEq total) by mouth daily.   senna 8.6 MG Tabs tablet Commonly known as: SENOKOT Take 2 tablets (17.2 mg total) by mouth 2 (two) times daily. What changed:   how much to take  when to take this  reasons to take this   tamsulosin 0.4 MG Caps capsule Commonly known as: FLOMAX Take 1 capsule (  0.4 mg total) by mouth daily after supper.   warfarin 2.5 MG tablet Commonly known as: COUMADIN Take 1 tablet (2.5 mg total) by mouth daily at 6 PM. What changed:   medication strength  how much to take       Contact information for follow-up providers    [facility]. Schedule an appointment as soon as possible for a visit in 1 week(s).   Why: Establish care for urinary retention, foley removal Contact information: [place] [phone]           Contact information for after-discharge care    [facility] .   Service: Skilled Nursing Contact information: [place] [phone]                 Allergies  Allergen Reactions  . Adhesive [Tape] Itching  . Penicillins Itching, Swelling and Rash    Streptomycin also  . Tuberculin Tests Itching, Swelling and Rash   Allergies as of 08/12/2019      Reactions   Adhesive [tape] Itching   Penicillins Itching, Swelling, Rash   Streptomycin also   Tuberculin Tests Itching, Swelling, Rash      Medication List    TAKE these medications   acetaminophen 325 MG tablet Commonly known as: TYLENOL Take 2 tablets (650 mg total) by mouth 3 (three) times daily.   amLODipine 5 MG tablet Commonly known as: NORVASC Take 1 tablet (5 mg total) by mouth daily. 5mg  in the morning and 2.5mg  at night What changed:   medication strength  how much to take  when to take this   Caltrate 600+D Plus Minerals 600-800 MG-UNIT Tabs Take 1 tablet by mouth 2 (two) times daily.   feeding supplement (ENSURE ENLIVE) Liqd Take 237 mLs by mouth 2 (two) times daily between meals.   furosemide 20 MG tablet Commonly known as: LASIX Take 1 tablet (20 mg total) by mouth daily.   multivitamin with minerals Tabs tablet Take 1 tablet by mouth daily.   oxybutynin 5 MG 24 hr tablet Commonly known as: DITROPAN-XL Take 1 tablet (5 mg total) by mouth daily.   potassium chloride 10 MEQ tablet Commonly known as: KLOR-CON Take 1 tablet (10 mEq total) by mouth daily.   senna 8.6 MG Tabs tablet Commonly known as: SENOKOT Take 2 tablets (17.2 mg total) by mouth 2 (two) times daily. What changed:   how much to take  when to take this  reasons to take this   tamsulosin 0.4 MG Caps capsule Commonly known as: FLOMAX Take 1 capsule (0.4 mg total) by mouth daily after supper.    warfarin 2.5 MG tablet Commonly known as: COUMADIN Take 1 tablet (2.5 mg total) by mouth daily at 6 PM. What changed:   medication strength  how much to take       Procedures/Studies: DG Chest 1 View  Result Date: 08/05/2019 CLINICAL DATA:  Failure to thrive. Recently hospitalized for reported COVID-19 pneumonia. EXAM: CHEST  1 VIEW COMPARISON:  Radiographs and CT 07/22/2019. Radiographs 08/20/2017. FINDINGS: 1734 hours the heart size and mediastinal contours are stable with aortic atherosclerosis. The multifocal airspace and ground-glass opacity seen in the lungs on the most recent prior study have nearly resolved. There are residual multifocal densities which appear largely chronic, similar to the 08/20/2017 study. There is no pleural effusion or pneumothorax. Patient is status post left shoulder  arthroplasty. Moderate glenohumeral degenerative changes are present on the right. There are old left-sided rib fractures. IMPRESSION: 1. Near complete resolution of multifocal airspace and ground-glass opacities since the most recent prior study consistent with resolving viral pneumonia. 2. Remaining pulmonary parenchymal densities appear chronic. No new findings. Electronically Signed   By: Richardean Sale M.D.   On: 08/05/2019 18:11   US Venous Img Lower Bilateral  Result Date: 08/05/2019 CLINICAL DATA:  Lower extremity swelling. EXAM: BILATERAL LOWER EXTREMITY VENOUS DOPPLER ULTRASOUND TECHNIQUE: Gray-scale sonography with graded compression, as well as color Doppler and duplex ultrasound were performed to evaluate the lower extremity deep venous systems from the level of the common femoral vein and including the common femoral, femoral, profunda femoral, popliteal and calf veins including the posterior tibial, peroneal and gastrocnemius veins when visible. The superficial great saphenous vein was also interrogated. Spectral Doppler was utilized to evaluate flow at rest and with distal  augmentation maneuvers in the common femoral, femoral and popliteal veins. COMPARISON:  None. FINDINGS: RIGHT LOWER EXTREMITY Common Femoral Vein: No evidence of thrombus. Normal compressibility, respiratory phasicity and response to augmentation. Saphenofemoral Junction: No evidence of thrombus. Normal compressibility and flow on color Doppler imaging. Profunda Femoral Vein: No evidence of thrombus. Normal compressibility and flow on color Doppler imaging. Femoral Vein: No evidence of thrombus. Normal compressibility, respiratory phasicity and response to augmentation. Popliteal Vein: No evidence of thrombus. Normal compressibility, respiratory phasicity and response to augmentation. Calf Veins: No evidence of thrombus. Normal compressibility and flow on color Doppler imaging. Other Findings:  Extensive soft tissue edema. LEFT LOWER EXTREMITY Common Femoral Vein: No evidence of thrombus. Normal compressibility, respiratory phasicity and response to augmentation. Saphenofemoral Junction: No evidence of thrombus. Normal compressibility and flow on color Doppler imaging. Profunda Femoral Vein: No evidence of thrombus. Normal compressibility and flow on color Doppler imaging. Femoral Vein: No evidence of thrombus. Normal compressibility, respiratory phasicity and response to augmentation. Popliteal Vein: No evidence of thrombus. Normal compressibility, respiratory phasicity and response to augmentation. Calf Veins: No evidence of thrombus. Normal compressibility and flow on color Doppler imaging. Other Findings:  Extensive soft tissue edema IMPRESSION: No evidence of deep venous thrombosis in either lower extremity. Extensive soft tissue edema. Electronically Signed   By: Misty Stanley M.D.   On: 08/05/2019 18:43   ECHOCARDIOGRAM COMPLETE  Result Date: 08/08/2019   ECHOCARDIOGRAM REPORT   Patient Name:   OLUWAKEMI STARKE Date of Exam: 08/08/2019 Medical Rec #:  [id number]       Height:       62.0 in Accession #:     [id number]      Weight:       180.3 lb Date of Birth:  July 19, 1929        BSA:          1.83 m Patient Age:    56 years        BP:           124/58 mmHg Patient Gender: F               HR:           59 bpm. Exam Location:  [facility] Procedure: 2D Echo, Cardiac Doppler and Color Doppler Indications:    Dyspnea 786.09 / R06.00  History:        Patient has no prior history of Echocardiogram examinations.                 Risk Factors:Hypertension. Elevated brain  natriuretic peptide                 (BNP) level,Bilateral lower extremity edema,History of pulmonary                 embolus (PE),GERD.  Sonographer:    Alvino Chapel RCS Referring Phys: Superior  1. Left ventricular ejection fraction, by visual estimation, is 60 to 65%. The left ventricle has normal function. There is mildly increased left ventricular hypertrophy.  2. Left ventricular diastolic parameters are consistent with Grade I diastolic dysfunction (impaired relaxation). Normal left heart filling pressures.  3. The left ventricle has no regional wall motion abnormalities.  4. Global right ventricle has normal systolic function.The right ventricular size is normal. No increase in right ventricular wall thickness.  5. Left atrial size was severely dilated.  6. Right atrial size was normal.  7. Moderate mitral annular calcification.  8. The mitral valve is normal in structure. No evidence of mitral valve regurgitation.  9. The tricuspid valve is grossly normal. 10. The aortic valve is tricuspid. Aortic valve regurgitation is trivial. Mild aortic valve sclerosis without stenosis. 11. The pulmonic valve was not well visualized. Pulmonic valve regurgitation is not visualized. 12. Normal pulmonary artery systolic pressure. 13. The inferior vena cava is normal in size with greater than 50% respiratory variability, suggesting right atrial pressure of 3 mmHg. FINDINGS  Left Ventricle: Left ventricular ejection fraction, by visual  estimation, is 60 to 65%. The left ventricle has normal function. The left ventricle has no regional wall motion abnormalities. The left ventricular internal cavity size was the left ventricle is normal in size. There is mildly increased left ventricular hypertrophy. Concentric left ventricular hypertrophy. Left ventricular diastolic parameters are consistent with Grade I diastolic dysfunction (impaired relaxation). Normal left atrial pressure. Right Ventricle: The right ventricular size is normal. No increase in right ventricular wall thickness. Global RV systolic function is has normal systolic function. The tricuspid regurgitant velocity is 2.27 m/s, and with an assumed right atrial pressure  of 3 mmHg, the estimated right ventricular systolic pressure is normal at 23.6 mmHg. Left Atrium: Left atrial size was severely dilated. Right Atrium: Right atrial size was normal in size Pericardium: There is no evidence of pericardial effusion. Mitral Valve: The mitral valve is normal in structure. Moderate mitral annular calcification. No evidence of mitral valve regurgitation. Tricuspid Valve: The tricuspid valve is grossly normal. Tricuspid valve regurgitation is mild. Aortic Valve: The aortic valve is tricuspid. Aortic valve regurgitation is trivial. Mild aortic valve sclerosis is present, with no evidence of aortic valve stenosis. Aortic valve mean gradient measures 8.0 mmHg. Aortic valve peak gradient measures 17.0 mmHg. Aortic valve area, by VTI measures 1.57 cm. Pulmonic Valve: The pulmonic valve was not well visualized. Pulmonic valve regurgitation is not visualized. Pulmonic regurgitation is not visualized. Aorta: The aortic root is normal in size and structure. Venous: The inferior vena cava is normal in size with greater than 50% respiratory variability, suggesting right atrial pressure of 3 mmHg. IAS/Shunts: No atrial level shunt detected by color flow Doppler.  LEFT VENTRICLE PLAX 2D LVIDd:         4.14 cm        Diastology LVIDs:         3.04 cm       LV e' lateral:   8.70 cm/s LV PW:         1.03 cm       LV E/e' lateral:  8.5 LV IVS:        1.41 cm       LV e' medial:    8.16 cm/s LVOT diam:     1.70 cm       LV E/e' medial:  9.0 LV SV:         40 ml LV SV Index:   20.59 LVOT Area:     2.27 cm  LV Volumes (MOD) LV area d, A2C:    21.50 cm LV area d, A4C:    21.10 cm LV area s, A2C:    11.20 cm LV area s, A4C:    11.80 cm LV major d, A2C:   6.48 cm LV major d, A4C:   6.28 cm LV major s, A2C:   5.20 cm LV major s, A4C:   5.54 cm LV vol d, MOD A2C: 61.0 ml LV vol d, MOD A4C: 58.4 ml LV vol s, MOD A2C: 22.4 ml LV vol s, MOD A4C: 21.5 ml LV SV MOD A2C:     38.6 ml LV SV MOD A4C:     58.4 ml LV SV MOD BP:      38.1 ml RIGHT VENTRICLE RV S prime:     14.60 cm/s TAPSE (M-mode): 2.4 cm LEFT ATRIUM              Index       RIGHT ATRIUM           Index LA diam:        2.60 cm  1.42 cm/m  RA Area:     15.70 cm LA Vol (A2C):   100.0 ml 54.66 ml/m RA Volume:   34.10 ml  18.64 ml/m LA Vol (A4C):   91.6 ml  50.07 ml/m LA Biplane Vol: 99.5 ml  54.39 ml/m  AORTIC VALVE AV Area (Vmax):    1.47 cm AV Area (Vmean):   1.56 cm AV Area (VTI):     1.57 cm AV Vmax:           206.00 cm/s AV Vmean:          128.000 cm/s AV VTI:            0.399 m AV Peak Grad:      17.0 mmHg AV Mean Grad:      8.0 mmHg LVOT Vmax:         133.00 cm/s LVOT Vmean:        87.800 cm/s LVOT VTI:          0.276 m LVOT/AV VTI ratio: 0.69  AORTA Ao Root diam: 2.90 cm MITRAL VALVE                         TRICUSPID VALVE MV Area (PHT): 2.03 cm              TR Peak grad:   20.6 mmHg MV PHT:        108.17 msec           TR Vmax:        227.00 cm/s MV Decel Time: 373 msec MV E velocity: 73.70 cm/s  103 cm/s  SHUNTS MV A velocity: 106.00 cm/s 70.3 cm/s Systemic VTI:  0.28 m MV E/A ratio:  0.70        1.5       Systemic Diam: 1.70 cm  Dani Gobble Croitoru MD Electronically signed by Sanda Klein MD Signature Date/Time: 08/08/2019/11:36:06 AM    Final  Subjective: Pt without complaints, feels well. Agreeable to go to SNF.   Discharge Exam: Vitals:   08/11/19 2113 08/12/19 0537  BP: (!) 128/55 (!) 145/68  Pulse: 80 71  Resp: 18 18  Temp: (!) 97.5 F (36.4 C) 98 F (36.7 C)  SpO2: 98% 95%   Vitals:   08/11/19 1734 08/11/19 2010 08/11/19 2113 08/12/19 0537  BP: (!) 124/57 129/60 (!) 128/55 (!) 145/68  Pulse: 73 73 80 71  Resp: 18 16 18 18   Temp: 98.3 F (36.8 C) 98.7 F (37.1 C) (!) 97.5 F (36.4 C) 98 F (36.7 C)  TempSrc: Oral Oral Oral Oral  SpO2: 94% 94% 98% 95%  Weight:      Height:       General exam: Elderly female chronically ill-appearing female sitting up in bed eating breakfast. Respiratory system: Shallow breathing. No increased work of breathing. Cardiovascular system: S1 & S2 heard. No JVD, murmurs, gallops, clicks or pedal edema. Gastrointestinal system: Abdomen is nondistended, soft and nontender. Normal bowel sounds heard. Central nervous system: Alert and oriented. No focal neurological deficits. Extremities: trace pretibial edema bilateral lower extremities.  TED hoses in place.    The results of significant diagnostics from this hospitalization (including imaging, microbiology, ancillary and laboratory) are listed below for reference.     Microbiology: Recent Results (from the past 240 hour(s))  SARS CORONAVIRUS 2 (TAT 6-24 HRS) Nasopharyngeal Nasopharyngeal Swab     Status: None   Collection Time: 08/05/19  8:17 PM   Specimen: Nasopharyngeal Swab  Result Value Ref Range Status   SARS Coronavirus 2 NEGATIVE NEGATIVE Final    Comment: (NOTE) SARS-CoV-2 target nucleic acids are NOT DETECTED. The SARS-CoV-2 RNA is generally detectable in upper and lower respiratory specimens during the acute phase of infection. Negative results do not preclude SARS-CoV-2 infection, do not rule out co-infections with other pathogens, and should not be used as the sole basis for treatment or other patient management  decisions. Negative results must be combined with clinical observations, patient history, and epidemiological information. The expected result is Negative. Fact Sheet for Patients: SugarRoll.be Fact Sheet for Healthcare Providers: https://www.woods-mathews.com/ This test is not yet approved or cleared by the [place] FDA and  has been authorized for detection and/or diagnosis of SARS-CoV-2 by FDA under an Emergency Use Authorization (EUA). This EUA will remain  in effect (meaning this test can be used) for the duration of the COVID-19 declaration under Section 56 4(b)(1) of the Act, 21 U.S.C. section 360bbb-3(b)(1), unless the authorization is terminated or revoked sooner. Performed at [facility], [place] [address] [place], [place] [address]   MRSA PCR Screening     Status: None   Collection Time: 08/07/19  3:40 AM   Specimen: Nasal Mucosa; Nasopharyngeal  Result Value Ref Range Status   MRSA by PCR NEGATIVE NEGATIVE Final    Comment:        The GeneXpert MRSA Assay (FDA approved for NASAL specimens only), is one component of a comprehensive MRSA colonization surveillance program. It is not intended to diagnose MRSA infection nor to guide or monitor treatment for MRSA infections. Performed at [facility], [address], [place],  [address]   Respiratory Panel by RT PCR (Flu A&B, Covid) - Nasopharyngeal Swab     Status: None   Collection Time: 08/10/19 11:14 AM   Specimen: Nasopharyngeal Swab  Result Value Ref Range Status   SARS Coronavirus 2 by RT PCR NEGATIVE NEGATIVE Final  Comment: (NOTE) SARS-CoV-2 target nucleic acids are NOT DETECTED. The SARS-CoV-2 RNA is generally detectable in upper respiratoy specimens during the acute phase of infection. The lowest concentration of SARS-CoV-2 viral copies this assay can detect is 131 copies/mL. A negative result does not preclude SARS-Cov-2 infection and should not  be used as the sole basis for treatment or other patient management decisions. A negative result may occur with  improper specimen collection/handling, submission of specimen other than nasopharyngeal swab, presence of viral mutation(s) within the areas targeted by this assay, and inadequate number of viral copies (<131 copies/mL). A negative result must be combined with clinical observations, patient history, and epidemiological information. The expected result is Negative. Fact Sheet for Patients:  PinkCheek.be Fact Sheet for Healthcare Providers:  GravelBags.it This test is not yet ap proved or cleared by the [place] FDA and  has been authorized for detection and/or diagnosis of SARS-CoV-2 by FDA under an Emergency Use Authorization (EUA). This EUA will remain  in effect (meaning this test can be used) for the duration of the COVID-19 declaration under Section 564(b)(1) of the Act, 21 U.S.C. section 360bbb-3(b)(1), unless the authorization is terminated or revoked sooner.    Influenza A by PCR NEGATIVE NEGATIVE Final   Influenza B by PCR NEGATIVE NEGATIVE Final    Comment: (NOTE) The Xpert Xpress SARS-CoV-2/FLU/RSV assay is intended as an aid in  the diagnosis of influenza from Nasopharyngeal swab specimens and  should not be used as a sole basis for treatment. Nasal washings and  aspirates are unacceptable for Xpert Xpress SARS-CoV-2/FLU/RSV  testing. Fact Sheet for Patients: PinkCheek.be Fact Sheet for Healthcare Providers: GravelBags.it This test is not yet approved or cleared by the [place] FDA and  has been authorized for detection and/or diagnosis of SARS-CoV-2 by  FDA under an Emergency Use Authorization (EUA). This EUA will remain  in effect (meaning this test can be used) for the duration of the  Covid-19 declaration under Section 564(b)(1) of  the Act, 21  U.S.C. section 360bbb-3(b)(1), unless the authorization is  terminated or revoked. Performed at [facility], [address] [place], [place] [address]      Labs: BNP (last 3 results) Recent Labs    08/05/19 1737  BNP [id number]   Basic Metabolic Panel: Recent Labs  Lab 08/06/19 0707 08/07/19 0423 08/08/19 0500 08/09/19 0545 08/10/19 0644 08/11/19 0603  NA 134* 136 136 136 136 135  K 3.5 3.3* 3.5 3.3* 4.0 3.8  CL 100 97* 100 97* 97* 96*  CO2 25 31 28 29 30 31   GLUCOSE 78 130* 109* 95 104* 92  BUN 14 19 23 20 20 16   CREATININE 0.48 0.59 0.58 0.50 0.52 0.47  CALCIUM 8.5* 8.7* 8.4* 8.2* 8.3* 8.1*  MG 1.8 1.6* 1.9 1.7 1.9  --   PHOS 3.6  --  2.7  --   --   --    Liver Function Tests: Recent Labs  Lab 08/05/19 1736 08/06/19 0707  AST 32 26  ALT 30 23  ALKPHOS 63 56  BILITOT 1.3* 1.2  PROT 5.7* 5.0*  ALBUMIN 2.7* 2.4*   No results for input(s): LIPASE, AMYLASE in the last 168 hours. No results for input(s): AMMONIA in the last 168 hours. CBC: Recent Labs  Lab 08/07/19 0423 08/08/19 0500 08/09/19 0545 08/10/19 0644 08/11/19 0603  WBC 8.3 6.5 4.2 6.1 4.6  HGB 11.5* 9.8* 9.5* 9.8* 8.4*  HCT 35.3* 30.5* 29.0* 31.0* 26.4*  MCV 95.4 96.5 98.3  98.1 98.9  PLT 194 169 165 183 175   Cardiac Enzymes: No results for input(s): CKTOTAL, CKMB, CKMBINDEX, TROPONINI in the last 168 hours. BNP: Invalid input(s): POCBNP CBG: No results for input(s): GLUCAP in the last 168 hours. D-Dimer No results for input(s): DDIMER in the last 72 hours. Hgb A1c No results for input(s): HGBA1C in the last 72 hours. Lipid Profile No results for input(s): CHOL, HDL, LDLCALC, TRIG, CHOLHDL, LDLDIRECT in the last 72 hours. Thyroid function studies No results for input(s): TSH, T4TOTAL, T3FREE, THYROIDAB in the last 72 hours.  Invalid input(s): FREET3 Anemia work up No results for input(s): VITAMINB12, FOLATE, FERRITIN, TIBC, IRON, RETICCTPCT in the last 72  hours. Urinalysis    Component Value Date/Time   COLORURINE YELLOW 06/12/2014 2106   APPEARANCEUR CLEAR 06/12/2014 2106   LABSPEC 1.016 06/12/2014 2106   PHURINE 7.0 06/12/2014 2106   GLUCOSEU NEGATIVE 06/12/2014 2106   HGBUR NEGATIVE 06/12/2014 2106   BILIRUBINUR NEGATIVE 06/12/2014 2106   KETONESUR NEGATIVE 06/12/2014 2106   PROTEINUR NEGATIVE 06/12/2014 2106   UROBILINOGEN 0.2 06/12/2014 2106   NITRITE NEGATIVE 06/12/2014 2106   LEUKOCYTESUR SMALL (A) 06/12/2014 2106   Sepsis Labs Invalid input(s): PROCALCITONIN,  WBC,  LACTICIDVEN Microbiology Recent Results (from the past 240 hour(s))  SARS CORONAVIRUS 2 (TAT 6-24 HRS) Nasopharyngeal Nasopharyngeal Swab     Status: None   Collection Time: 08/05/19  8:17 PM   Specimen: Nasopharyngeal Swab  Result Value Ref Range Status   SARS Coronavirus 2 NEGATIVE NEGATIVE Final    Comment: (NOTE) SARS-CoV-2 target nucleic acids are NOT DETECTED. The SARS-CoV-2 RNA is generally detectable in upper and lower respiratory specimens during the acute phase of infection. Negative results do not preclude SARS-CoV-2 infection, do not rule out co-infections with other pathogens, and should not be used as the sole basis for treatment or other patient management decisions. Negative results must be combined with clinical observations, patient history, and epidemiological information. The expected result is Negative. Fact Sheet for Patients: SugarRoll.be Fact Sheet for Healthcare Providers: https://www.woods-mathews.com/ This test is not yet approved or cleared by the [place] FDA and  has been authorized for detection and/or diagnosis of SARS-CoV-2 by FDA under an Emergency Use Authorization (EUA). This EUA will remain  in effect (meaning this test can be used) for the duration of the COVID-19 declaration under Section 56 4(b)(1) of the Act, 21 U.S.C. section 360bbb-3(b)(1), unless the authorization  is terminated or revoked sooner. Performed at [facility], [place] [address] [place], [place] [address]   MRSA PCR Screening     Status: None   Collection Time: 08/07/19  3:40 AM   Specimen: Nasal Mucosa; Nasopharyngeal  Result Value Ref Range Status   MRSA by PCR NEGATIVE NEGATIVE Final    Comment:        The GeneXpert MRSA Assay (FDA approved for NASAL specimens only), is one component of a comprehensive MRSA colonization surveillance program. It is not intended to diagnose MRSA infection nor to guide or monitor treatment for MRSA infections. Performed at [facility] : [facility], [address], [place], [place] [address]   Respiratory Panel by RT PCR (Flu A&B, Covid) - Nasopharyngeal Swab     Status: None   Collection Time: 08/10/19 11:14 AM   Specimen: Nasopharyngeal Swab  Result Value Ref Range Status   SARS Coronavirus 2 by RT PCR NEGATIVE NEGATIVE Final    Comment: (NOTE) SARS-CoV-2 target nucleic acids are NOT DETECTED. The SARS-CoV-2 RNA is generally  detectable in upper respiratoy specimens during the acute phase of infection. The lowest concentration of SARS-CoV-2 viral copies this assay can detect is 131 copies/mL. A negative result does not preclude SARS-Cov-2 infection and should not be used as the sole basis for treatment or other patient management decisions. A negative result may occur with  improper specimen collection/handling, submission of specimen other than nasopharyngeal swab, presence of viral mutation(s) within the areas targeted by this assay, and inadequate number of viral copies (<131 copies/mL). A negative result must be combined with clinical observations, patient history, and epidemiological information. The expected result is Negative. Fact Sheet for Patients:  PinkCheek.be Fact Sheet for Healthcare Providers:  GravelBags.it This test is not yet ap proved or cleared by the [place] FDA  and  has been authorized for detection and/or diagnosis of SARS-CoV-2 by FDA under an Emergency Use Authorization (EUA). This EUA will remain  in effect (meaning this test can be used) for the duration of the COVID-19 declaration under Section 564(b)(1) of the Act, 21 U.S.C. section 360bbb-3(b)(1), unless the authorization is terminated or revoked sooner.    Influenza A by PCR NEGATIVE NEGATIVE Final   Influenza B by PCR NEGATIVE NEGATIVE Final    Comment: (NOTE) The Xpert Xpress SARS-CoV-2/FLU/RSV assay is intended as an aid in  the diagnosis of influenza from Nasopharyngeal swab specimens and  should not be used as a sole basis for treatment. Nasal washings and  aspirates are unacceptable for Xpert Xpress SARS-CoV-2/FLU/RSV  testing. Fact Sheet for Patients: PinkCheek.be Fact Sheet for Healthcare Providers: GravelBags.it This test is not yet approved or cleared by the [place] FDA and  has been authorized for detection and/or diagnosis of SARS-CoV-2 by  FDA under an Emergency Use Authorization (EUA). This EUA will remain  in effect (meaning this test can be used) for the duration of the  Covid-19 declaration under Section 564(b)(1) of the Act, 21  U.S.C. section 360bbb-3(b)(1), unless the authorization is  terminated or revoked. Performed at [facility], [address], [place], [place] [address]    Time coordinating discharge: 33 minutes   SIGNED:  Irwin Brakeman, MD  [facility] 08/12/2019, 10:45 AM How to contact the [facility] Attending or Consulting provider [place] or covering provider during after hours [place], for this patient?  1. Check the care team in [facility] and look for a) attending/consulting [facility] provider listed and b) the [facility] team listed 2. Log into [email or web address] and use [facility]'s universal password to access. If you do not have the password, please contact the hospital operator. 3. Locate the [facility]  provider you are looking for under [facility] and page to a number that you can be directly reached. 4. If you still have difficulty reaching the provider, please page the [facility] (Director on Call) for the Hospitalists listed on amion for assistance.

## 2019-08-11 NOTE — Care Management Important Message (Signed)
Important Message  Patient Details  Name: Melissa Lambert MRN: [id number] Date of Birth: 03-20-1929   Medicare Important Message Given:  Yes(Brandi, RN will deliver to patient)     Tommy Medal 08/11/2019, 2:13 PM

## 2019-08-11 NOTE — TOC Transition Note (Addendum)
Transition of Care [facility]) - CM/SW Discharge Note   Patient Details  Name: Tunesia Dettman MRN: [id number] Date of Birth: [birth year]  Transition of Care [facility]) CM/SW Contact:  Shekera Beavers, Chauncey [place], RN Phone Number: 08/11/2019, 2:18 PM   Clinical Narrative:  Patient can DC to [facility] today. Bedside RN to call report. EMS arranged. DC clinicals sent.  ADDENDUM: 08/12/19: Patient could not discharge [id number] due to [facility] not having a room ready for quarantine. Patient can discharge [id number] to [facility]. EMS arranged. Bedside RN to call report.     Final next level of care: Skilled Nursing Facility Barriers to Discharge: Barriers Resolved   Patient Goals and CMS Choice Patient states their goals for this hospitalization and ongoing recovery are:: to discharge to SNF for short term rehab and to get stronger CMS Medicare.gov Compare Post Acute Care list provided to:: Patient Choice offered to / list presented to : Patient  Discharge Placement              Patient chooses bed at: Other - please specify in the comment section below:([facility]) Patient to be transferred to facility by: [facility] Name of family member notified: [place] Patient and family notified of of transfer: 08/11/19  Discharge Plan and Services In-house Referral: Clinical Social Work   Post Acute Care Choice: [facility]                    Social Determinants of Health (SDOH) Interventions     Readmission Risk Interventions No flowsheet data found.

## 2019-08-12 DIAGNOSIS — M6281 Muscle weakness (generalized): Secondary | ICD-10-CM | POA: Diagnosis not present

## 2019-08-12 DIAGNOSIS — D649 Anemia, unspecified: Secondary | ICD-10-CM | POA: Diagnosis not present

## 2019-08-12 DIAGNOSIS — I110 Hypertensive heart disease with heart failure: Secondary | ICD-10-CM | POA: Diagnosis not present

## 2019-08-12 DIAGNOSIS — R339 Retention of urine, unspecified: Secondary | ICD-10-CM | POA: Diagnosis not present

## 2019-08-12 DIAGNOSIS — R600 Localized edema: Secondary | ICD-10-CM | POA: Diagnosis not present

## 2019-08-12 DIAGNOSIS — R791 Abnormal coagulation profile: Secondary | ICD-10-CM | POA: Diagnosis not present

## 2019-08-12 DIAGNOSIS — R0603 Acute respiratory distress: Secondary | ICD-10-CM | POA: Diagnosis not present

## 2019-08-12 DIAGNOSIS — J189 Pneumonia, unspecified organism: Secondary | ICD-10-CM | POA: Diagnosis not present

## 2019-08-12 DIAGNOSIS — E46 Unspecified protein-calorie malnutrition: Secondary | ICD-10-CM | POA: Diagnosis not present

## 2019-08-12 DIAGNOSIS — R41841 Cognitive communication deficit: Secondary | ICD-10-CM | POA: Diagnosis present

## 2019-08-12 DIAGNOSIS — E8809 Other disorders of plasma-protein metabolism, not elsewhere classified: Secondary | ICD-10-CM | POA: Diagnosis not present

## 2019-08-12 DIAGNOSIS — Z5181 Encounter for therapeutic drug level monitoring: Secondary | ICD-10-CM | POA: Diagnosis not present

## 2019-08-12 DIAGNOSIS — K219 Gastro-esophageal reflux disease without esophagitis: Secondary | ICD-10-CM | POA: Diagnosis present

## 2019-08-12 DIAGNOSIS — Z86711 Personal history of pulmonary embolism: Secondary | ICD-10-CM | POA: Diagnosis not present

## 2019-08-12 DIAGNOSIS — Z7901 Long term (current) use of anticoagulants: Secondary | ICD-10-CM | POA: Diagnosis not present

## 2019-08-12 DIAGNOSIS — Z23 Encounter for immunization: Secondary | ICD-10-CM | POA: Diagnosis not present

## 2019-08-12 DIAGNOSIS — E871 Hypo-osmolality and hyponatremia: Secondary | ICD-10-CM | POA: Diagnosis not present

## 2019-08-12 DIAGNOSIS — R7989 Other specified abnormal findings of blood chemistry: Secondary | ICD-10-CM | POA: Diagnosis not present

## 2019-08-12 DIAGNOSIS — E43 Unspecified severe protein-calorie malnutrition: Secondary | ICD-10-CM | POA: Diagnosis not present

## 2019-08-12 DIAGNOSIS — I8290 Acute embolism and thrombosis of unspecified vein: Secondary | ICD-10-CM | POA: Diagnosis not present

## 2019-08-12 DIAGNOSIS — R2681 Unsteadiness on feet: Secondary | ICD-10-CM | POA: Diagnosis present

## 2019-08-12 DIAGNOSIS — L89103 Pressure ulcer of unspecified part of back, stage 3: Secondary | ICD-10-CM | POA: Diagnosis not present

## 2019-08-12 DIAGNOSIS — D6859 Other primary thrombophilia: Secondary | ICD-10-CM | POA: Diagnosis not present

## 2019-08-12 DIAGNOSIS — M7989 Other specified soft tissue disorders: Secondary | ICD-10-CM

## 2019-08-12 DIAGNOSIS — I10 Essential (primary) hypertension: Secondary | ICD-10-CM | POA: Diagnosis present

## 2019-08-12 DIAGNOSIS — K5900 Constipation, unspecified: Secondary | ICD-10-CM | POA: Diagnosis not present

## 2019-08-12 DIAGNOSIS — R627 Adult failure to thrive: Secondary | ICD-10-CM | POA: Diagnosis present

## 2019-08-12 DIAGNOSIS — I5032 Chronic diastolic (congestive) heart failure: Secondary | ICD-10-CM | POA: Diagnosis not present

## 2019-08-12 DIAGNOSIS — I5033 Acute on chronic diastolic (congestive) heart failure: Secondary | ICD-10-CM | POA: Diagnosis present

## 2019-08-12 DIAGNOSIS — R918 Other nonspecific abnormal finding of lung field: Secondary | ICD-10-CM | POA: Diagnosis not present

## 2019-08-12 LAB — PROTIME-INR
INR: 2.4 — ABNORMAL HIGH (ref 0.8–1.2)
Prothrombin Time: 26.3 seconds — ABNORMAL HIGH (ref 11.4–15.2)

## 2019-08-16 DIAGNOSIS — I5032 Chronic diastolic (congestive) heart failure: Secondary | ICD-10-CM | POA: Diagnosis not present

## 2019-08-16 DIAGNOSIS — I110 Hypertensive heart disease with heart failure: Secondary | ICD-10-CM | POA: Diagnosis not present

## 2019-08-16 DIAGNOSIS — E8809 Other disorders of plasma-protein metabolism, not elsewhere classified: Secondary | ICD-10-CM | POA: Diagnosis not present

## 2019-08-16 DIAGNOSIS — E871 Hypo-osmolality and hyponatremia: Secondary | ICD-10-CM | POA: Diagnosis not present

## 2019-08-17 DIAGNOSIS — I110 Hypertensive heart disease with heart failure: Secondary | ICD-10-CM | POA: Diagnosis not present

## 2019-08-17 DIAGNOSIS — I5032 Chronic diastolic (congestive) heart failure: Secondary | ICD-10-CM | POA: Diagnosis not present

## 2019-08-18 DIAGNOSIS — L89103 Pressure ulcer of unspecified part of back, stage 3: Secondary | ICD-10-CM | POA: Diagnosis not present

## 2019-08-18 DIAGNOSIS — R627 Adult failure to thrive: Secondary | ICD-10-CM | POA: Diagnosis not present

## 2019-08-18 DIAGNOSIS — E43 Unspecified severe protein-calorie malnutrition: Secondary | ICD-10-CM | POA: Diagnosis not present

## 2019-08-19 DIAGNOSIS — R339 Retention of urine, unspecified: Secondary | ICD-10-CM | POA: Diagnosis not present

## 2019-08-19 DIAGNOSIS — Z5181 Encounter for therapeutic drug level monitoring: Secondary | ICD-10-CM | POA: Diagnosis not present

## 2019-08-19 DIAGNOSIS — I5032 Chronic diastolic (congestive) heart failure: Secondary | ICD-10-CM | POA: Diagnosis not present

## 2019-08-23 DIAGNOSIS — Z5181 Encounter for therapeutic drug level monitoring: Secondary | ICD-10-CM | POA: Diagnosis not present

## 2019-08-23 DIAGNOSIS — R339 Retention of urine, unspecified: Secondary | ICD-10-CM | POA: Diagnosis not present

## 2019-08-23 DIAGNOSIS — Z7901 Long term (current) use of anticoagulants: Secondary | ICD-10-CM | POA: Diagnosis not present

## 2019-08-25 DIAGNOSIS — L89103 Pressure ulcer of unspecified part of back, stage 3: Secondary | ICD-10-CM | POA: Diagnosis not present

## 2019-08-25 DIAGNOSIS — R627 Adult failure to thrive: Secondary | ICD-10-CM | POA: Diagnosis not present

## 2019-08-25 DIAGNOSIS — M6281 Muscle weakness (generalized): Secondary | ICD-10-CM | POA: Diagnosis not present

## 2019-08-25 DIAGNOSIS — E46 Unspecified protein-calorie malnutrition: Secondary | ICD-10-CM | POA: Diagnosis not present

## 2019-08-26 DIAGNOSIS — Z5181 Encounter for therapeutic drug level monitoring: Secondary | ICD-10-CM | POA: Diagnosis not present

## 2019-08-26 DIAGNOSIS — Z7901 Long term (current) use of anticoagulants: Secondary | ICD-10-CM | POA: Diagnosis not present

## 2019-08-30 DIAGNOSIS — Z7901 Long term (current) use of anticoagulants: Secondary | ICD-10-CM | POA: Diagnosis not present

## 2019-08-30 DIAGNOSIS — Z5181 Encounter for therapeutic drug level monitoring: Secondary | ICD-10-CM | POA: Diagnosis not present

## 2019-09-01 DIAGNOSIS — M6281 Muscle weakness (generalized): Secondary | ICD-10-CM | POA: Diagnosis not present

## 2019-09-01 DIAGNOSIS — E46 Unspecified protein-calorie malnutrition: Secondary | ICD-10-CM | POA: Diagnosis not present

## 2019-09-01 DIAGNOSIS — L89103 Pressure ulcer of unspecified part of back, stage 3: Secondary | ICD-10-CM | POA: Diagnosis not present

## 2019-09-01 DIAGNOSIS — R627 Adult failure to thrive: Secondary | ICD-10-CM | POA: Diagnosis not present

## 2019-09-02 DIAGNOSIS — Z5181 Encounter for therapeutic drug level monitoring: Secondary | ICD-10-CM | POA: Diagnosis not present

## 2019-09-02 DIAGNOSIS — Z7901 Long term (current) use of anticoagulants: Secondary | ICD-10-CM | POA: Diagnosis not present

## 2019-09-06 DIAGNOSIS — Z7901 Long term (current) use of anticoagulants: Secondary | ICD-10-CM | POA: Diagnosis not present

## 2019-09-06 DIAGNOSIS — D6859 Other primary thrombophilia: Secondary | ICD-10-CM | POA: Diagnosis not present

## 2019-09-06 DIAGNOSIS — Z5181 Encounter for therapeutic drug level monitoring: Secondary | ICD-10-CM | POA: Diagnosis not present

## 2019-09-07 ENCOUNTER — Encounter (HOSPITAL_COMMUNITY): Payer: Self-pay | Admitting: Internal Medicine

## 2019-09-07 DIAGNOSIS — M7989 Other specified soft tissue disorders: Secondary | ICD-10-CM

## 2019-09-08 DIAGNOSIS — M6281 Muscle weakness (generalized): Secondary | ICD-10-CM | POA: Diagnosis not present

## 2019-09-08 DIAGNOSIS — R627 Adult failure to thrive: Secondary | ICD-10-CM | POA: Diagnosis not present

## 2019-09-08 DIAGNOSIS — L89103 Pressure ulcer of unspecified part of back, stage 3: Secondary | ICD-10-CM | POA: Diagnosis not present

## 2019-09-08 DIAGNOSIS — E46 Unspecified protein-calorie malnutrition: Secondary | ICD-10-CM | POA: Diagnosis not present

## 2019-09-09 DIAGNOSIS — I110 Hypertensive heart disease with heart failure: Secondary | ICD-10-CM | POA: Diagnosis not present

## 2019-09-09 DIAGNOSIS — Z86711 Personal history of pulmonary embolism: Secondary | ICD-10-CM | POA: Diagnosis not present

## 2019-09-09 DIAGNOSIS — Z7901 Long term (current) use of anticoagulants: Secondary | ICD-10-CM | POA: Diagnosis not present

## 2019-09-09 DIAGNOSIS — Z5181 Encounter for therapeutic drug level monitoring: Secondary | ICD-10-CM | POA: Diagnosis not present

## 2019-09-13 DIAGNOSIS — Z5181 Encounter for therapeutic drug level monitoring: Secondary | ICD-10-CM | POA: Diagnosis not present

## 2019-09-13 DIAGNOSIS — Z7901 Long term (current) use of anticoagulants: Secondary | ICD-10-CM | POA: Diagnosis not present

## 2019-09-13 DIAGNOSIS — E871 Hypo-osmolality and hyponatremia: Secondary | ICD-10-CM | POA: Diagnosis not present

## 2019-09-14 DIAGNOSIS — Z23 Encounter for immunization: Secondary | ICD-10-CM | POA: Diagnosis not present

## 2019-09-15 DIAGNOSIS — M6281 Muscle weakness (generalized): Secondary | ICD-10-CM | POA: Diagnosis not present

## 2019-09-15 DIAGNOSIS — E46 Unspecified protein-calorie malnutrition: Secondary | ICD-10-CM | POA: Diagnosis not present

## 2019-09-15 DIAGNOSIS — R627 Adult failure to thrive: Secondary | ICD-10-CM | POA: Diagnosis not present

## 2019-09-15 DIAGNOSIS — L89103 Pressure ulcer of unspecified part of back, stage 3: Secondary | ICD-10-CM | POA: Diagnosis not present

## 2019-09-16 DIAGNOSIS — Z5181 Encounter for therapeutic drug level monitoring: Secondary | ICD-10-CM | POA: Diagnosis not present

## 2019-09-16 DIAGNOSIS — D649 Anemia, unspecified: Secondary | ICD-10-CM | POA: Diagnosis not present

## 2019-09-20 DIAGNOSIS — Z7901 Long term (current) use of anticoagulants: Secondary | ICD-10-CM | POA: Diagnosis not present

## 2019-09-20 DIAGNOSIS — R791 Abnormal coagulation profile: Secondary | ICD-10-CM | POA: Diagnosis not present

## 2019-09-27 DIAGNOSIS — Z5181 Encounter for therapeutic drug level monitoring: Secondary | ICD-10-CM | POA: Diagnosis not present

## 2019-09-27 DIAGNOSIS — Z7901 Long term (current) use of anticoagulants: Secondary | ICD-10-CM | POA: Diagnosis not present

## 2019-09-28 DIAGNOSIS — R600 Localized edema: Secondary | ICD-10-CM | POA: Diagnosis not present

## 2019-09-29 DIAGNOSIS — R600 Localized edema: Secondary | ICD-10-CM | POA: Diagnosis not present

## 2019-09-29 DIAGNOSIS — I5032 Chronic diastolic (congestive) heart failure: Secondary | ICD-10-CM | POA: Diagnosis not present

## 2019-09-29 DIAGNOSIS — R0603 Acute respiratory distress: Secondary | ICD-10-CM | POA: Diagnosis not present

## 2019-09-29 DIAGNOSIS — Z7901 Long term (current) use of anticoagulants: Secondary | ICD-10-CM | POA: Diagnosis not present

## 2019-10-04 DIAGNOSIS — Z5181 Encounter for therapeutic drug level monitoring: Secondary | ICD-10-CM | POA: Diagnosis not present

## 2019-10-04 DIAGNOSIS — I5032 Chronic diastolic (congestive) heart failure: Secondary | ICD-10-CM | POA: Diagnosis not present

## 2019-10-04 DIAGNOSIS — Z7901 Long term (current) use of anticoagulants: Secondary | ICD-10-CM | POA: Diagnosis not present

## 2019-10-04 DIAGNOSIS — R600 Localized edema: Secondary | ICD-10-CM | POA: Diagnosis not present

## 2019-10-05 DIAGNOSIS — K5900 Constipation, unspecified: Secondary | ICD-10-CM | POA: Diagnosis not present

## 2019-10-05 DIAGNOSIS — I5032 Chronic diastolic (congestive) heart failure: Secondary | ICD-10-CM | POA: Diagnosis not present

## 2019-10-05 DIAGNOSIS — I8290 Acute embolism and thrombosis of unspecified vein: Secondary | ICD-10-CM | POA: Diagnosis not present

## 2019-10-07 DIAGNOSIS — Z5181 Encounter for therapeutic drug level monitoring: Secondary | ICD-10-CM | POA: Diagnosis not present

## 2019-10-07 DIAGNOSIS — Z7901 Long term (current) use of anticoagulants: Secondary | ICD-10-CM | POA: Diagnosis not present

## 2019-10-11 DIAGNOSIS — E871 Hypo-osmolality and hyponatremia: Secondary | ICD-10-CM | POA: Diagnosis not present

## 2019-10-11 DIAGNOSIS — I5032 Chronic diastolic (congestive) heart failure: Secondary | ICD-10-CM | POA: Diagnosis not present

## 2019-10-11 DIAGNOSIS — Z86711 Personal history of pulmonary embolism: Secondary | ICD-10-CM | POA: Diagnosis not present

## 2019-10-11 DIAGNOSIS — I110 Hypertensive heart disease with heart failure: Secondary | ICD-10-CM | POA: Diagnosis not present

## 2019-10-15 DIAGNOSIS — Z9981 Dependence on supplemental oxygen: Secondary | ICD-10-CM | POA: Diagnosis not present

## 2019-10-15 DIAGNOSIS — I110 Hypertensive heart disease with heart failure: Secondary | ICD-10-CM | POA: Diagnosis not present

## 2019-10-15 DIAGNOSIS — M810 Age-related osteoporosis without current pathological fracture: Secondary | ICD-10-CM | POA: Diagnosis not present

## 2019-10-15 DIAGNOSIS — E871 Hypo-osmolality and hyponatremia: Secondary | ICD-10-CM | POA: Diagnosis not present

## 2019-10-15 DIAGNOSIS — J45909 Unspecified asthma, uncomplicated: Secondary | ICD-10-CM | POA: Diagnosis not present

## 2019-10-15 DIAGNOSIS — Z8616 Personal history of COVID-19: Secondary | ICD-10-CM | POA: Diagnosis not present

## 2019-10-15 DIAGNOSIS — E46 Unspecified protein-calorie malnutrition: Secondary | ICD-10-CM | POA: Diagnosis not present

## 2019-10-15 DIAGNOSIS — Z8701 Personal history of pneumonia (recurrent): Secondary | ICD-10-CM | POA: Diagnosis not present

## 2019-10-15 DIAGNOSIS — K219 Gastro-esophageal reflux disease without esophagitis: Secondary | ICD-10-CM | POA: Diagnosis not present

## 2019-10-15 DIAGNOSIS — M6281 Muscle weakness (generalized): Secondary | ICD-10-CM | POA: Diagnosis not present

## 2019-10-15 DIAGNOSIS — I5032 Chronic diastolic (congestive) heart failure: Secondary | ICD-10-CM | POA: Diagnosis not present

## 2019-10-18 DIAGNOSIS — I10 Essential (primary) hypertension: Secondary | ICD-10-CM | POA: Diagnosis not present

## 2019-10-18 DIAGNOSIS — Z6833 Body mass index (BMI) 33.0-33.9, adult: Secondary | ICD-10-CM | POA: Diagnosis not present

## 2019-10-18 DIAGNOSIS — Z789 Other specified health status: Secondary | ICD-10-CM | POA: Diagnosis not present

## 2019-10-18 DIAGNOSIS — E7800 Pure hypercholesterolemia, unspecified: Secondary | ICD-10-CM | POA: Diagnosis not present

## 2019-10-18 DIAGNOSIS — Z299 Encounter for prophylactic measures, unspecified: Secondary | ICD-10-CM | POA: Diagnosis not present

## 2019-10-18 DIAGNOSIS — I2699 Other pulmonary embolism without acute cor pulmonale: Secondary | ICD-10-CM | POA: Diagnosis not present

## 2019-10-19 DIAGNOSIS — E871 Hypo-osmolality and hyponatremia: Secondary | ICD-10-CM | POA: Diagnosis not present

## 2019-10-19 DIAGNOSIS — I5032 Chronic diastolic (congestive) heart failure: Secondary | ICD-10-CM | POA: Diagnosis not present

## 2019-10-19 DIAGNOSIS — E46 Unspecified protein-calorie malnutrition: Secondary | ICD-10-CM | POA: Diagnosis not present

## 2019-10-19 DIAGNOSIS — J45909 Unspecified asthma, uncomplicated: Secondary | ICD-10-CM | POA: Diagnosis not present

## 2019-10-19 DIAGNOSIS — I110 Hypertensive heart disease with heart failure: Secondary | ICD-10-CM | POA: Diagnosis not present

## 2019-10-19 DIAGNOSIS — M6281 Muscle weakness (generalized): Secondary | ICD-10-CM | POA: Diagnosis not present

## 2019-10-21 DIAGNOSIS — I110 Hypertensive heart disease with heart failure: Secondary | ICD-10-CM | POA: Diagnosis not present

## 2019-10-21 DIAGNOSIS — J45909 Unspecified asthma, uncomplicated: Secondary | ICD-10-CM | POA: Diagnosis not present

## 2019-10-21 DIAGNOSIS — M6281 Muscle weakness (generalized): Secondary | ICD-10-CM | POA: Diagnosis not present

## 2019-10-21 DIAGNOSIS — I5032 Chronic diastolic (congestive) heart failure: Secondary | ICD-10-CM | POA: Diagnosis not present

## 2019-10-21 DIAGNOSIS — E46 Unspecified protein-calorie malnutrition: Secondary | ICD-10-CM | POA: Diagnosis not present

## 2019-10-21 DIAGNOSIS — E871 Hypo-osmolality and hyponatremia: Secondary | ICD-10-CM | POA: Diagnosis not present

## 2019-10-22 DIAGNOSIS — E46 Unspecified protein-calorie malnutrition: Secondary | ICD-10-CM | POA: Diagnosis not present

## 2019-10-22 DIAGNOSIS — M6281 Muscle weakness (generalized): Secondary | ICD-10-CM | POA: Diagnosis not present

## 2019-10-22 DIAGNOSIS — I110 Hypertensive heart disease with heart failure: Secondary | ICD-10-CM | POA: Diagnosis not present

## 2019-10-22 DIAGNOSIS — J45909 Unspecified asthma, uncomplicated: Secondary | ICD-10-CM | POA: Diagnosis not present

## 2019-10-22 DIAGNOSIS — E871 Hypo-osmolality and hyponatremia: Secondary | ICD-10-CM | POA: Diagnosis not present

## 2019-10-22 DIAGNOSIS — I5032 Chronic diastolic (congestive) heart failure: Secondary | ICD-10-CM | POA: Diagnosis not present

## 2019-10-27 DIAGNOSIS — I110 Hypertensive heart disease with heart failure: Secondary | ICD-10-CM | POA: Diagnosis not present

## 2019-10-27 DIAGNOSIS — M159 Polyosteoarthritis, unspecified: Secondary | ICD-10-CM | POA: Diagnosis not present

## 2019-10-27 DIAGNOSIS — E871 Hypo-osmolality and hyponatremia: Secondary | ICD-10-CM | POA: Diagnosis not present

## 2019-10-27 DIAGNOSIS — J45909 Unspecified asthma, uncomplicated: Secondary | ICD-10-CM | POA: Diagnosis not present

## 2019-10-27 DIAGNOSIS — I5032 Chronic diastolic (congestive) heart failure: Secondary | ICD-10-CM | POA: Diagnosis not present

## 2019-10-27 DIAGNOSIS — M6281 Muscle weakness (generalized): Secondary | ICD-10-CM | POA: Diagnosis not present

## 2019-10-27 DIAGNOSIS — I10 Essential (primary) hypertension: Secondary | ICD-10-CM | POA: Diagnosis not present

## 2019-10-27 DIAGNOSIS — E46 Unspecified protein-calorie malnutrition: Secondary | ICD-10-CM | POA: Diagnosis not present

## 2019-10-28 DIAGNOSIS — E46 Unspecified protein-calorie malnutrition: Secondary | ICD-10-CM | POA: Diagnosis not present

## 2019-10-28 DIAGNOSIS — J45909 Unspecified asthma, uncomplicated: Secondary | ICD-10-CM | POA: Diagnosis not present

## 2019-10-28 DIAGNOSIS — I5032 Chronic diastolic (congestive) heart failure: Secondary | ICD-10-CM | POA: Diagnosis not present

## 2019-10-28 DIAGNOSIS — M6281 Muscle weakness (generalized): Secondary | ICD-10-CM | POA: Diagnosis not present

## 2019-10-28 DIAGNOSIS — I110 Hypertensive heart disease with heart failure: Secondary | ICD-10-CM | POA: Diagnosis not present

## 2019-10-28 DIAGNOSIS — E871 Hypo-osmolality and hyponatremia: Secondary | ICD-10-CM | POA: Diagnosis not present

## 2019-10-29 DIAGNOSIS — J45909 Unspecified asthma, uncomplicated: Secondary | ICD-10-CM | POA: Diagnosis not present

## 2019-10-29 DIAGNOSIS — E46 Unspecified protein-calorie malnutrition: Secondary | ICD-10-CM | POA: Diagnosis not present

## 2019-10-29 DIAGNOSIS — I5032 Chronic diastolic (congestive) heart failure: Secondary | ICD-10-CM | POA: Diagnosis not present

## 2019-10-29 DIAGNOSIS — I110 Hypertensive heart disease with heart failure: Secondary | ICD-10-CM | POA: Diagnosis not present

## 2019-10-29 DIAGNOSIS — M6281 Muscle weakness (generalized): Secondary | ICD-10-CM | POA: Diagnosis not present

## 2019-10-29 DIAGNOSIS — E871 Hypo-osmolality and hyponatremia: Secondary | ICD-10-CM | POA: Diagnosis not present

## 2019-11-01 DIAGNOSIS — J45909 Unspecified asthma, uncomplicated: Secondary | ICD-10-CM | POA: Diagnosis not present

## 2019-11-01 DIAGNOSIS — E871 Hypo-osmolality and hyponatremia: Secondary | ICD-10-CM | POA: Diagnosis not present

## 2019-11-01 DIAGNOSIS — I110 Hypertensive heart disease with heart failure: Secondary | ICD-10-CM | POA: Diagnosis not present

## 2019-11-01 DIAGNOSIS — I5032 Chronic diastolic (congestive) heart failure: Secondary | ICD-10-CM | POA: Diagnosis not present

## 2019-11-01 DIAGNOSIS — E46 Unspecified protein-calorie malnutrition: Secondary | ICD-10-CM | POA: Diagnosis not present

## 2019-11-01 DIAGNOSIS — M6281 Muscle weakness (generalized): Secondary | ICD-10-CM | POA: Diagnosis not present

## 2019-11-03 DIAGNOSIS — M6281 Muscle weakness (generalized): Secondary | ICD-10-CM | POA: Diagnosis not present

## 2019-11-03 DIAGNOSIS — I5032 Chronic diastolic (congestive) heart failure: Secondary | ICD-10-CM | POA: Diagnosis not present

## 2019-11-03 DIAGNOSIS — E871 Hypo-osmolality and hyponatremia: Secondary | ICD-10-CM | POA: Diagnosis not present

## 2019-11-03 DIAGNOSIS — J45909 Unspecified asthma, uncomplicated: Secondary | ICD-10-CM | POA: Diagnosis not present

## 2019-11-03 DIAGNOSIS — I110 Hypertensive heart disease with heart failure: Secondary | ICD-10-CM | POA: Diagnosis not present

## 2019-11-03 DIAGNOSIS — E46 Unspecified protein-calorie malnutrition: Secondary | ICD-10-CM | POA: Diagnosis not present

## 2019-11-04 DIAGNOSIS — J45909 Unspecified asthma, uncomplicated: Secondary | ICD-10-CM | POA: Diagnosis not present

## 2019-11-04 DIAGNOSIS — I110 Hypertensive heart disease with heart failure: Secondary | ICD-10-CM | POA: Diagnosis not present

## 2019-11-04 DIAGNOSIS — E46 Unspecified protein-calorie malnutrition: Secondary | ICD-10-CM | POA: Diagnosis not present

## 2019-11-04 DIAGNOSIS — E871 Hypo-osmolality and hyponatremia: Secondary | ICD-10-CM | POA: Diagnosis not present

## 2019-11-04 DIAGNOSIS — I5032 Chronic diastolic (congestive) heart failure: Secondary | ICD-10-CM | POA: Diagnosis not present

## 2019-11-04 DIAGNOSIS — M6281 Muscle weakness (generalized): Secondary | ICD-10-CM | POA: Diagnosis not present

## 2019-11-05 DIAGNOSIS — E46 Unspecified protein-calorie malnutrition: Secondary | ICD-10-CM | POA: Diagnosis not present

## 2019-11-05 DIAGNOSIS — J45909 Unspecified asthma, uncomplicated: Secondary | ICD-10-CM | POA: Diagnosis not present

## 2019-11-05 DIAGNOSIS — I5032 Chronic diastolic (congestive) heart failure: Secondary | ICD-10-CM | POA: Diagnosis not present

## 2019-11-05 DIAGNOSIS — M6281 Muscle weakness (generalized): Secondary | ICD-10-CM | POA: Diagnosis not present

## 2019-11-05 DIAGNOSIS — I110 Hypertensive heart disease with heart failure: Secondary | ICD-10-CM | POA: Diagnosis not present

## 2019-11-05 DIAGNOSIS — E871 Hypo-osmolality and hyponatremia: Secondary | ICD-10-CM | POA: Diagnosis not present

## 2019-11-08 DIAGNOSIS — E871 Hypo-osmolality and hyponatremia: Secondary | ICD-10-CM | POA: Diagnosis not present

## 2019-11-08 DIAGNOSIS — E46 Unspecified protein-calorie malnutrition: Secondary | ICD-10-CM | POA: Diagnosis not present

## 2019-11-08 DIAGNOSIS — M6281 Muscle weakness (generalized): Secondary | ICD-10-CM | POA: Diagnosis not present

## 2019-11-08 DIAGNOSIS — I110 Hypertensive heart disease with heart failure: Secondary | ICD-10-CM | POA: Diagnosis not present

## 2019-11-08 DIAGNOSIS — J45909 Unspecified asthma, uncomplicated: Secondary | ICD-10-CM | POA: Diagnosis not present

## 2019-11-08 DIAGNOSIS — I5032 Chronic diastolic (congestive) heart failure: Secondary | ICD-10-CM | POA: Diagnosis not present

## 2019-11-09 DIAGNOSIS — M6281 Muscle weakness (generalized): Secondary | ICD-10-CM | POA: Diagnosis not present

## 2019-11-09 DIAGNOSIS — J45909 Unspecified asthma, uncomplicated: Secondary | ICD-10-CM | POA: Diagnosis not present

## 2019-11-09 DIAGNOSIS — E46 Unspecified protein-calorie malnutrition: Secondary | ICD-10-CM | POA: Diagnosis not present

## 2019-11-09 DIAGNOSIS — I5032 Chronic diastolic (congestive) heart failure: Secondary | ICD-10-CM | POA: Diagnosis not present

## 2019-11-09 DIAGNOSIS — I110 Hypertensive heart disease with heart failure: Secondary | ICD-10-CM | POA: Diagnosis not present

## 2019-11-09 DIAGNOSIS — E871 Hypo-osmolality and hyponatremia: Secondary | ICD-10-CM | POA: Diagnosis not present

## 2019-11-10 DIAGNOSIS — I110 Hypertensive heart disease with heart failure: Secondary | ICD-10-CM | POA: Diagnosis not present

## 2019-11-10 DIAGNOSIS — J45909 Unspecified asthma, uncomplicated: Secondary | ICD-10-CM | POA: Diagnosis not present

## 2019-11-10 DIAGNOSIS — E871 Hypo-osmolality and hyponatremia: Secondary | ICD-10-CM | POA: Diagnosis not present

## 2019-11-10 DIAGNOSIS — E46 Unspecified protein-calorie malnutrition: Secondary | ICD-10-CM | POA: Diagnosis not present

## 2019-11-10 DIAGNOSIS — M6281 Muscle weakness (generalized): Secondary | ICD-10-CM | POA: Diagnosis not present

## 2019-11-10 DIAGNOSIS — I5032 Chronic diastolic (congestive) heart failure: Secondary | ICD-10-CM | POA: Diagnosis not present

## 2019-11-11 DIAGNOSIS — I110 Hypertensive heart disease with heart failure: Secondary | ICD-10-CM | POA: Diagnosis not present

## 2019-11-11 DIAGNOSIS — I5032 Chronic diastolic (congestive) heart failure: Secondary | ICD-10-CM | POA: Diagnosis not present

## 2019-11-11 DIAGNOSIS — E46 Unspecified protein-calorie malnutrition: Secondary | ICD-10-CM | POA: Diagnosis not present

## 2019-11-11 DIAGNOSIS — E871 Hypo-osmolality and hyponatremia: Secondary | ICD-10-CM | POA: Diagnosis not present

## 2019-11-11 DIAGNOSIS — J45909 Unspecified asthma, uncomplicated: Secondary | ICD-10-CM | POA: Diagnosis not present

## 2019-11-11 DIAGNOSIS — M6281 Muscle weakness (generalized): Secondary | ICD-10-CM | POA: Diagnosis not present

## 2019-11-14 DIAGNOSIS — K219 Gastro-esophageal reflux disease without esophagitis: Secondary | ICD-10-CM | POA: Diagnosis not present

## 2019-11-14 DIAGNOSIS — Z8616 Personal history of COVID-19: Secondary | ICD-10-CM | POA: Diagnosis not present

## 2019-11-14 DIAGNOSIS — I5032 Chronic diastolic (congestive) heart failure: Secondary | ICD-10-CM | POA: Diagnosis not present

## 2019-11-14 DIAGNOSIS — Z8701 Personal history of pneumonia (recurrent): Secondary | ICD-10-CM | POA: Diagnosis not present

## 2019-11-14 DIAGNOSIS — M6281 Muscle weakness (generalized): Secondary | ICD-10-CM | POA: Diagnosis not present

## 2019-11-14 DIAGNOSIS — I110 Hypertensive heart disease with heart failure: Secondary | ICD-10-CM | POA: Diagnosis not present

## 2019-11-14 DIAGNOSIS — J45909 Unspecified asthma, uncomplicated: Secondary | ICD-10-CM | POA: Diagnosis not present

## 2019-11-14 DIAGNOSIS — E46 Unspecified protein-calorie malnutrition: Secondary | ICD-10-CM | POA: Diagnosis not present

## 2019-11-14 DIAGNOSIS — Z9981 Dependence on supplemental oxygen: Secondary | ICD-10-CM | POA: Diagnosis not present

## 2019-11-14 DIAGNOSIS — E871 Hypo-osmolality and hyponatremia: Secondary | ICD-10-CM | POA: Diagnosis not present

## 2019-11-14 DIAGNOSIS — M810 Age-related osteoporosis without current pathological fracture: Secondary | ICD-10-CM | POA: Diagnosis not present

## 2019-11-15 DIAGNOSIS — E46 Unspecified protein-calorie malnutrition: Secondary | ICD-10-CM | POA: Diagnosis not present

## 2019-11-15 DIAGNOSIS — I110 Hypertensive heart disease with heart failure: Secondary | ICD-10-CM | POA: Diagnosis not present

## 2019-11-15 DIAGNOSIS — E871 Hypo-osmolality and hyponatremia: Secondary | ICD-10-CM | POA: Diagnosis not present

## 2019-11-15 DIAGNOSIS — I5032 Chronic diastolic (congestive) heart failure: Secondary | ICD-10-CM | POA: Diagnosis not present

## 2019-11-15 DIAGNOSIS — J45909 Unspecified asthma, uncomplicated: Secondary | ICD-10-CM | POA: Diagnosis not present

## 2019-11-15 DIAGNOSIS — M6281 Muscle weakness (generalized): Secondary | ICD-10-CM | POA: Diagnosis not present

## 2019-11-16 DIAGNOSIS — J45909 Unspecified asthma, uncomplicated: Secondary | ICD-10-CM | POA: Diagnosis not present

## 2019-11-16 DIAGNOSIS — E871 Hypo-osmolality and hyponatremia: Secondary | ICD-10-CM | POA: Diagnosis not present

## 2019-11-16 DIAGNOSIS — M6281 Muscle weakness (generalized): Secondary | ICD-10-CM | POA: Diagnosis not present

## 2019-11-16 DIAGNOSIS — I110 Hypertensive heart disease with heart failure: Secondary | ICD-10-CM | POA: Diagnosis not present

## 2019-11-16 DIAGNOSIS — E46 Unspecified protein-calorie malnutrition: Secondary | ICD-10-CM | POA: Diagnosis not present

## 2019-11-16 DIAGNOSIS — I5032 Chronic diastolic (congestive) heart failure: Secondary | ICD-10-CM | POA: Diagnosis not present

## 2019-11-18 DIAGNOSIS — I110 Hypertensive heart disease with heart failure: Secondary | ICD-10-CM | POA: Diagnosis not present

## 2019-11-18 DIAGNOSIS — J45909 Unspecified asthma, uncomplicated: Secondary | ICD-10-CM | POA: Diagnosis not present

## 2019-11-18 DIAGNOSIS — M6281 Muscle weakness (generalized): Secondary | ICD-10-CM | POA: Diagnosis not present

## 2019-11-18 DIAGNOSIS — I5032 Chronic diastolic (congestive) heart failure: Secondary | ICD-10-CM | POA: Diagnosis not present

## 2019-11-18 DIAGNOSIS — E46 Unspecified protein-calorie malnutrition: Secondary | ICD-10-CM | POA: Diagnosis not present

## 2019-11-18 DIAGNOSIS — E871 Hypo-osmolality and hyponatremia: Secondary | ICD-10-CM | POA: Diagnosis not present

## 2019-11-22 DIAGNOSIS — J45909 Unspecified asthma, uncomplicated: Secondary | ICD-10-CM | POA: Diagnosis not present

## 2019-11-22 DIAGNOSIS — I110 Hypertensive heart disease with heart failure: Secondary | ICD-10-CM | POA: Diagnosis not present

## 2019-11-22 DIAGNOSIS — E871 Hypo-osmolality and hyponatremia: Secondary | ICD-10-CM | POA: Diagnosis not present

## 2019-11-22 DIAGNOSIS — I5032 Chronic diastolic (congestive) heart failure: Secondary | ICD-10-CM | POA: Diagnosis not present

## 2019-11-22 DIAGNOSIS — M6281 Muscle weakness (generalized): Secondary | ICD-10-CM | POA: Diagnosis not present

## 2019-11-22 DIAGNOSIS — E46 Unspecified protein-calorie malnutrition: Secondary | ICD-10-CM | POA: Diagnosis not present

## 2019-11-23 DIAGNOSIS — E46 Unspecified protein-calorie malnutrition: Secondary | ICD-10-CM | POA: Diagnosis not present

## 2019-11-23 DIAGNOSIS — I2699 Other pulmonary embolism without acute cor pulmonale: Secondary | ICD-10-CM | POA: Diagnosis not present

## 2019-11-23 DIAGNOSIS — E871 Hypo-osmolality and hyponatremia: Secondary | ICD-10-CM | POA: Diagnosis not present

## 2019-11-23 DIAGNOSIS — I5032 Chronic diastolic (congestive) heart failure: Secondary | ICD-10-CM | POA: Diagnosis not present

## 2019-11-23 DIAGNOSIS — I110 Hypertensive heart disease with heart failure: Secondary | ICD-10-CM | POA: Diagnosis not present

## 2019-11-23 DIAGNOSIS — J9611 Chronic respiratory failure with hypoxia: Secondary | ICD-10-CM | POA: Diagnosis not present

## 2019-11-23 DIAGNOSIS — M6281 Muscle weakness (generalized): Secondary | ICD-10-CM | POA: Diagnosis not present

## 2019-11-23 DIAGNOSIS — Z299 Encounter for prophylactic measures, unspecified: Secondary | ICD-10-CM | POA: Diagnosis not present

## 2019-11-23 DIAGNOSIS — I5021 Acute systolic (congestive) heart failure: Secondary | ICD-10-CM | POA: Diagnosis not present

## 2019-11-23 DIAGNOSIS — I10 Essential (primary) hypertension: Secondary | ICD-10-CM | POA: Diagnosis not present

## 2019-11-23 DIAGNOSIS — J45909 Unspecified asthma, uncomplicated: Secondary | ICD-10-CM | POA: Diagnosis not present

## 2019-11-29 DIAGNOSIS — I5032 Chronic diastolic (congestive) heart failure: Secondary | ICD-10-CM | POA: Diagnosis not present

## 2019-11-29 DIAGNOSIS — E871 Hypo-osmolality and hyponatremia: Secondary | ICD-10-CM | POA: Diagnosis not present

## 2019-11-29 DIAGNOSIS — M6281 Muscle weakness (generalized): Secondary | ICD-10-CM | POA: Diagnosis not present

## 2019-11-29 DIAGNOSIS — E46 Unspecified protein-calorie malnutrition: Secondary | ICD-10-CM | POA: Diagnosis not present

## 2019-11-29 DIAGNOSIS — J45909 Unspecified asthma, uncomplicated: Secondary | ICD-10-CM | POA: Diagnosis not present

## 2019-11-29 DIAGNOSIS — I110 Hypertensive heart disease with heart failure: Secondary | ICD-10-CM | POA: Diagnosis not present

## 2019-12-02 DIAGNOSIS — I5032 Chronic diastolic (congestive) heart failure: Secondary | ICD-10-CM | POA: Diagnosis not present

## 2019-12-02 DIAGNOSIS — I110 Hypertensive heart disease with heart failure: Secondary | ICD-10-CM | POA: Diagnosis not present

## 2019-12-02 DIAGNOSIS — E46 Unspecified protein-calorie malnutrition: Secondary | ICD-10-CM | POA: Diagnosis not present

## 2019-12-02 DIAGNOSIS — M6281 Muscle weakness (generalized): Secondary | ICD-10-CM | POA: Diagnosis not present

## 2019-12-02 DIAGNOSIS — E871 Hypo-osmolality and hyponatremia: Secondary | ICD-10-CM | POA: Diagnosis not present

## 2019-12-02 DIAGNOSIS — J45909 Unspecified asthma, uncomplicated: Secondary | ICD-10-CM | POA: Diagnosis not present

## 2019-12-07 DIAGNOSIS — Z1339 Encounter for screening examination for other mental health and behavioral disorders: Secondary | ICD-10-CM | POA: Diagnosis not present

## 2019-12-07 DIAGNOSIS — I10 Essential (primary) hypertension: Secondary | ICD-10-CM | POA: Diagnosis not present

## 2019-12-07 DIAGNOSIS — Z0000 Encounter for general adult medical examination without abnormal findings: Secondary | ICD-10-CM | POA: Diagnosis not present

## 2019-12-07 DIAGNOSIS — Z1331 Encounter for screening for depression: Secondary | ICD-10-CM | POA: Diagnosis not present

## 2019-12-07 DIAGNOSIS — Z79899 Other long term (current) drug therapy: Secondary | ICD-10-CM | POA: Diagnosis not present

## 2019-12-07 DIAGNOSIS — E559 Vitamin D deficiency, unspecified: Secondary | ICD-10-CM | POA: Diagnosis not present

## 2019-12-07 DIAGNOSIS — I2699 Other pulmonary embolism without acute cor pulmonale: Secondary | ICD-10-CM | POA: Diagnosis not present

## 2019-12-07 DIAGNOSIS — E7800 Pure hypercholesterolemia, unspecified: Secondary | ICD-10-CM | POA: Diagnosis not present

## 2019-12-07 DIAGNOSIS — Z7189 Other specified counseling: Secondary | ICD-10-CM | POA: Diagnosis not present

## 2019-12-07 DIAGNOSIS — Z6833 Body mass index (BMI) 33.0-33.9, adult: Secondary | ICD-10-CM | POA: Diagnosis not present

## 2019-12-07 DIAGNOSIS — R5383 Other fatigue: Secondary | ICD-10-CM | POA: Diagnosis not present

## 2019-12-07 DIAGNOSIS — Z299 Encounter for prophylactic measures, unspecified: Secondary | ICD-10-CM | POA: Diagnosis not present

## 2019-12-09 DIAGNOSIS — I5032 Chronic diastolic (congestive) heart failure: Secondary | ICD-10-CM | POA: Diagnosis not present

## 2019-12-09 DIAGNOSIS — E871 Hypo-osmolality and hyponatremia: Secondary | ICD-10-CM | POA: Diagnosis not present

## 2019-12-09 DIAGNOSIS — I110 Hypertensive heart disease with heart failure: Secondary | ICD-10-CM | POA: Diagnosis not present

## 2019-12-09 DIAGNOSIS — J45909 Unspecified asthma, uncomplicated: Secondary | ICD-10-CM | POA: Diagnosis not present

## 2019-12-09 DIAGNOSIS — M6281 Muscle weakness (generalized): Secondary | ICD-10-CM | POA: Diagnosis not present

## 2019-12-09 DIAGNOSIS — E46 Unspecified protein-calorie malnutrition: Secondary | ICD-10-CM | POA: Diagnosis not present

## 2019-12-14 DIAGNOSIS — Z9981 Dependence on supplemental oxygen: Secondary | ICD-10-CM | POA: Diagnosis not present

## 2019-12-14 DIAGNOSIS — Z8616 Personal history of COVID-19: Secondary | ICD-10-CM | POA: Diagnosis not present

## 2019-12-14 DIAGNOSIS — M810 Age-related osteoporosis without current pathological fracture: Secondary | ICD-10-CM | POA: Diagnosis not present

## 2019-12-14 DIAGNOSIS — E871 Hypo-osmolality and hyponatremia: Secondary | ICD-10-CM | POA: Diagnosis not present

## 2019-12-14 DIAGNOSIS — I110 Hypertensive heart disease with heart failure: Secondary | ICD-10-CM | POA: Diagnosis not present

## 2019-12-14 DIAGNOSIS — Z8701 Personal history of pneumonia (recurrent): Secondary | ICD-10-CM | POA: Diagnosis not present

## 2019-12-14 DIAGNOSIS — E46 Unspecified protein-calorie malnutrition: Secondary | ICD-10-CM | POA: Diagnosis not present

## 2019-12-14 DIAGNOSIS — M6281 Muscle weakness (generalized): Secondary | ICD-10-CM | POA: Diagnosis not present

## 2019-12-14 DIAGNOSIS — I5032 Chronic diastolic (congestive) heart failure: Secondary | ICD-10-CM | POA: Diagnosis not present

## 2019-12-14 DIAGNOSIS — J45909 Unspecified asthma, uncomplicated: Secondary | ICD-10-CM | POA: Diagnosis not present

## 2019-12-14 DIAGNOSIS — K219 Gastro-esophageal reflux disease without esophagitis: Secondary | ICD-10-CM | POA: Diagnosis not present

## 2019-12-21 DIAGNOSIS — I10 Essential (primary) hypertension: Secondary | ICD-10-CM | POA: Diagnosis not present

## 2019-12-21 DIAGNOSIS — Z299 Encounter for prophylactic measures, unspecified: Secondary | ICD-10-CM | POA: Diagnosis not present

## 2019-12-21 DIAGNOSIS — I5032 Chronic diastolic (congestive) heart failure: Secondary | ICD-10-CM | POA: Diagnosis not present

## 2019-12-21 DIAGNOSIS — I5021 Acute systolic (congestive) heart failure: Secondary | ICD-10-CM | POA: Diagnosis not present

## 2019-12-21 DIAGNOSIS — I110 Hypertensive heart disease with heart failure: Secondary | ICD-10-CM | POA: Diagnosis not present

## 2019-12-21 DIAGNOSIS — E46 Unspecified protein-calorie malnutrition: Secondary | ICD-10-CM | POA: Diagnosis not present

## 2019-12-21 DIAGNOSIS — I2699 Other pulmonary embolism without acute cor pulmonale: Secondary | ICD-10-CM | POA: Diagnosis not present

## 2019-12-21 DIAGNOSIS — M6281 Muscle weakness (generalized): Secondary | ICD-10-CM | POA: Diagnosis not present

## 2019-12-22 DIAGNOSIS — E46 Unspecified protein-calorie malnutrition: Secondary | ICD-10-CM | POA: Diagnosis not present

## 2019-12-22 DIAGNOSIS — E871 Hypo-osmolality and hyponatremia: Secondary | ICD-10-CM | POA: Diagnosis not present

## 2019-12-22 DIAGNOSIS — I5032 Chronic diastolic (congestive) heart failure: Secondary | ICD-10-CM | POA: Diagnosis not present

## 2019-12-22 DIAGNOSIS — M6281 Muscle weakness (generalized): Secondary | ICD-10-CM | POA: Diagnosis not present

## 2019-12-22 DIAGNOSIS — J45909 Unspecified asthma, uncomplicated: Secondary | ICD-10-CM | POA: Diagnosis not present

## 2019-12-22 DIAGNOSIS — I110 Hypertensive heart disease with heart failure: Secondary | ICD-10-CM | POA: Diagnosis not present

## 2019-12-27 DIAGNOSIS — J45909 Unspecified asthma, uncomplicated: Secondary | ICD-10-CM | POA: Diagnosis not present

## 2019-12-27 DIAGNOSIS — M6281 Muscle weakness (generalized): Secondary | ICD-10-CM | POA: Diagnosis not present

## 2019-12-27 DIAGNOSIS — E46 Unspecified protein-calorie malnutrition: Secondary | ICD-10-CM | POA: Diagnosis not present

## 2019-12-27 DIAGNOSIS — E871 Hypo-osmolality and hyponatremia: Secondary | ICD-10-CM | POA: Diagnosis not present

## 2019-12-27 DIAGNOSIS — I5032 Chronic diastolic (congestive) heart failure: Secondary | ICD-10-CM | POA: Diagnosis not present

## 2019-12-27 DIAGNOSIS — I110 Hypertensive heart disease with heart failure: Secondary | ICD-10-CM | POA: Diagnosis not present

## 2020-01-07 DIAGNOSIS — E46 Unspecified protein-calorie malnutrition: Secondary | ICD-10-CM | POA: Diagnosis not present

## 2020-01-07 DIAGNOSIS — I5032 Chronic diastolic (congestive) heart failure: Secondary | ICD-10-CM | POA: Diagnosis not present

## 2020-01-07 DIAGNOSIS — I110 Hypertensive heart disease with heart failure: Secondary | ICD-10-CM | POA: Diagnosis not present

## 2020-01-07 DIAGNOSIS — E871 Hypo-osmolality and hyponatremia: Secondary | ICD-10-CM | POA: Diagnosis not present

## 2020-01-07 DIAGNOSIS — M6281 Muscle weakness (generalized): Secondary | ICD-10-CM | POA: Diagnosis not present

## 2020-01-07 DIAGNOSIS — J45909 Unspecified asthma, uncomplicated: Secondary | ICD-10-CM | POA: Diagnosis not present

## 2020-01-09 DIAGNOSIS — I10 Essential (primary) hypertension: Secondary | ICD-10-CM | POA: Diagnosis not present

## 2020-01-09 DIAGNOSIS — M159 Polyosteoarthritis, unspecified: Secondary | ICD-10-CM | POA: Diagnosis not present

## 2020-01-12 DIAGNOSIS — I5032 Chronic diastolic (congestive) heart failure: Secondary | ICD-10-CM | POA: Diagnosis not present

## 2020-01-12 DIAGNOSIS — E871 Hypo-osmolality and hyponatremia: Secondary | ICD-10-CM | POA: Diagnosis not present

## 2020-01-12 DIAGNOSIS — I110 Hypertensive heart disease with heart failure: Secondary | ICD-10-CM | POA: Diagnosis not present

## 2020-01-12 DIAGNOSIS — E46 Unspecified protein-calorie malnutrition: Secondary | ICD-10-CM | POA: Diagnosis not present

## 2020-01-12 DIAGNOSIS — J45909 Unspecified asthma, uncomplicated: Secondary | ICD-10-CM | POA: Diagnosis not present

## 2020-01-12 DIAGNOSIS — M6281 Muscle weakness (generalized): Secondary | ICD-10-CM | POA: Diagnosis not present

## 2020-01-13 DIAGNOSIS — I110 Hypertensive heart disease with heart failure: Secondary | ICD-10-CM | POA: Diagnosis not present

## 2020-01-21 DIAGNOSIS — I10 Essential (primary) hypertension: Secondary | ICD-10-CM | POA: Diagnosis not present

## 2020-01-21 DIAGNOSIS — I5021 Acute systolic (congestive) heart failure: Secondary | ICD-10-CM | POA: Diagnosis not present

## 2020-01-21 DIAGNOSIS — I2699 Other pulmonary embolism without acute cor pulmonale: Secondary | ICD-10-CM | POA: Diagnosis not present

## 2020-01-21 DIAGNOSIS — Z299 Encounter for prophylactic measures, unspecified: Secondary | ICD-10-CM | POA: Diagnosis not present

## 2020-02-23 DIAGNOSIS — H43813 Vitreous degeneration, bilateral: Secondary | ICD-10-CM | POA: Diagnosis not present

## 2020-02-23 DIAGNOSIS — H02831 Dermatochalasis of right upper eyelid: Secondary | ICD-10-CM | POA: Diagnosis not present

## 2020-02-23 DIAGNOSIS — H02834 Dermatochalasis of left upper eyelid: Secondary | ICD-10-CM | POA: Diagnosis not present

## 2020-02-23 DIAGNOSIS — D3132 Benign neoplasm of left choroid: Secondary | ICD-10-CM | POA: Diagnosis not present

## 2020-02-23 DIAGNOSIS — H04123 Dry eye syndrome of bilateral lacrimal glands: Secondary | ICD-10-CM | POA: Diagnosis not present

## 2020-02-23 DIAGNOSIS — Z961 Presence of intraocular lens: Secondary | ICD-10-CM | POA: Diagnosis not present

## 2020-03-10 DIAGNOSIS — I10 Essential (primary) hypertension: Secondary | ICD-10-CM | POA: Diagnosis not present

## 2020-03-10 DIAGNOSIS — M159 Polyosteoarthritis, unspecified: Secondary | ICD-10-CM | POA: Diagnosis not present

## 2020-03-17 DIAGNOSIS — I5021 Acute systolic (congestive) heart failure: Secondary | ICD-10-CM | POA: Diagnosis not present

## 2020-03-17 DIAGNOSIS — Z6832 Body mass index (BMI) 32.0-32.9, adult: Secondary | ICD-10-CM | POA: Diagnosis not present

## 2020-03-17 DIAGNOSIS — Z299 Encounter for prophylactic measures, unspecified: Secondary | ICD-10-CM | POA: Diagnosis not present

## 2020-03-17 DIAGNOSIS — I2699 Other pulmonary embolism without acute cor pulmonale: Secondary | ICD-10-CM | POA: Diagnosis not present

## 2020-03-17 DIAGNOSIS — I10 Essential (primary) hypertension: Secondary | ICD-10-CM | POA: Diagnosis not present

## 2020-03-21 DIAGNOSIS — E559 Vitamin D deficiency, unspecified: Secondary | ICD-10-CM | POA: Diagnosis not present

## 2020-03-21 DIAGNOSIS — R5383 Other fatigue: Secondary | ICD-10-CM | POA: Diagnosis not present

## 2020-03-21 DIAGNOSIS — Z79899 Other long term (current) drug therapy: Secondary | ICD-10-CM | POA: Diagnosis not present

## 2020-03-21 DIAGNOSIS — E7800 Pure hypercholesterolemia, unspecified: Secondary | ICD-10-CM | POA: Diagnosis not present

## 2020-03-31 DIAGNOSIS — M159 Polyosteoarthritis, unspecified: Secondary | ICD-10-CM | POA: Diagnosis not present

## 2020-03-31 DIAGNOSIS — I10 Essential (primary) hypertension: Secondary | ICD-10-CM | POA: Diagnosis not present

## 2020-04-19 DIAGNOSIS — I10 Essential (primary) hypertension: Secondary | ICD-10-CM | POA: Diagnosis not present

## 2020-04-19 DIAGNOSIS — L0390 Cellulitis, unspecified: Secondary | ICD-10-CM | POA: Diagnosis not present

## 2020-04-19 DIAGNOSIS — Z6832 Body mass index (BMI) 32.0-32.9, adult: Secondary | ICD-10-CM | POA: Diagnosis not present

## 2020-04-19 DIAGNOSIS — I2699 Other pulmonary embolism without acute cor pulmonale: Secondary | ICD-10-CM | POA: Diagnosis not present

## 2020-04-19 DIAGNOSIS — Z299 Encounter for prophylactic measures, unspecified: Secondary | ICD-10-CM | POA: Diagnosis not present

## 2020-04-26 DIAGNOSIS — Z23 Encounter for immunization: Secondary | ICD-10-CM | POA: Diagnosis not present

## 2020-05-03 DIAGNOSIS — J9611 Chronic respiratory failure with hypoxia: Secondary | ICD-10-CM | POA: Diagnosis not present

## 2020-05-03 DIAGNOSIS — I739 Peripheral vascular disease, unspecified: Secondary | ICD-10-CM | POA: Diagnosis not present

## 2020-05-03 DIAGNOSIS — Z6833 Body mass index (BMI) 33.0-33.9, adult: Secondary | ICD-10-CM | POA: Diagnosis not present

## 2020-05-03 DIAGNOSIS — I10 Essential (primary) hypertension: Secondary | ICD-10-CM | POA: Diagnosis not present

## 2020-05-03 DIAGNOSIS — Z86711 Personal history of pulmonary embolism: Secondary | ICD-10-CM | POA: Diagnosis not present

## 2020-05-03 DIAGNOSIS — Z299 Encounter for prophylactic measures, unspecified: Secondary | ICD-10-CM | POA: Diagnosis not present

## 2020-05-11 DIAGNOSIS — M159 Polyosteoarthritis, unspecified: Secondary | ICD-10-CM | POA: Diagnosis not present

## 2020-05-11 DIAGNOSIS — I10 Essential (primary) hypertension: Secondary | ICD-10-CM | POA: Diagnosis not present

## 2020-05-17 DIAGNOSIS — Z713 Dietary counseling and surveillance: Secondary | ICD-10-CM | POA: Diagnosis not present

## 2020-05-17 DIAGNOSIS — I2699 Other pulmonary embolism without acute cor pulmonale: Secondary | ICD-10-CM | POA: Diagnosis not present

## 2020-05-17 DIAGNOSIS — I10 Essential (primary) hypertension: Secondary | ICD-10-CM | POA: Diagnosis not present

## 2020-05-17 DIAGNOSIS — Z299 Encounter for prophylactic measures, unspecified: Secondary | ICD-10-CM | POA: Diagnosis not present

## 2020-05-17 DIAGNOSIS — Z6832 Body mass index (BMI) 32.0-32.9, adult: Secondary | ICD-10-CM | POA: Diagnosis not present

## 2020-05-25 DIAGNOSIS — Z23 Encounter for immunization: Secondary | ICD-10-CM | POA: Diagnosis not present

## 2020-06-09 DIAGNOSIS — M159 Polyosteoarthritis, unspecified: Secondary | ICD-10-CM | POA: Diagnosis not present

## 2020-06-09 DIAGNOSIS — I10 Essential (primary) hypertension: Secondary | ICD-10-CM | POA: Diagnosis not present

## 2020-06-15 DIAGNOSIS — Z6832 Body mass index (BMI) 32.0-32.9, adult: Secondary | ICD-10-CM | POA: Diagnosis not present

## 2020-06-15 DIAGNOSIS — Z713 Dietary counseling and surveillance: Secondary | ICD-10-CM | POA: Diagnosis not present

## 2020-06-15 DIAGNOSIS — I2699 Other pulmonary embolism without acute cor pulmonale: Secondary | ICD-10-CM | POA: Diagnosis not present

## 2020-06-15 DIAGNOSIS — Z299 Encounter for prophylactic measures, unspecified: Secondary | ICD-10-CM | POA: Diagnosis not present

## 2020-06-15 DIAGNOSIS — I10 Essential (primary) hypertension: Secondary | ICD-10-CM | POA: Diagnosis not present

## 2020-07-11 DIAGNOSIS — I10 Essential (primary) hypertension: Secondary | ICD-10-CM | POA: Diagnosis not present

## 2020-07-11 DIAGNOSIS — M159 Polyosteoarthritis, unspecified: Secondary | ICD-10-CM | POA: Diagnosis not present

## 2020-07-21 DIAGNOSIS — Z6832 Body mass index (BMI) 32.0-32.9, adult: Secondary | ICD-10-CM | POA: Diagnosis not present

## 2020-07-21 DIAGNOSIS — Z299 Encounter for prophylactic measures, unspecified: Secondary | ICD-10-CM | POA: Diagnosis not present

## 2020-07-21 DIAGNOSIS — I2699 Other pulmonary embolism without acute cor pulmonale: Secondary | ICD-10-CM | POA: Diagnosis not present

## 2020-07-21 DIAGNOSIS — I10 Essential (primary) hypertension: Secondary | ICD-10-CM | POA: Diagnosis not present

## 2020-07-21 DIAGNOSIS — I5021 Acute systolic (congestive) heart failure: Secondary | ICD-10-CM | POA: Diagnosis not present

## 2020-07-24 DIAGNOSIS — M25561 Pain in right knee: Secondary | ICD-10-CM | POA: Diagnosis not present

## 2020-07-24 DIAGNOSIS — I2699 Other pulmonary embolism without acute cor pulmonale: Secondary | ICD-10-CM | POA: Diagnosis not present

## 2020-07-24 DIAGNOSIS — Z299 Encounter for prophylactic measures, unspecified: Secondary | ICD-10-CM | POA: Diagnosis not present

## 2020-07-24 DIAGNOSIS — I739 Peripheral vascular disease, unspecified: Secondary | ICD-10-CM | POA: Diagnosis not present

## 2020-07-24 DIAGNOSIS — I5021 Acute systolic (congestive) heart failure: Secondary | ICD-10-CM | POA: Diagnosis not present

## 2020-07-24 DIAGNOSIS — Z6832 Body mass index (BMI) 32.0-32.9, adult: Secondary | ICD-10-CM | POA: Diagnosis not present

## 2020-07-24 DIAGNOSIS — I10 Essential (primary) hypertension: Secondary | ICD-10-CM | POA: Diagnosis not present

## 2020-08-07 DIAGNOSIS — R059 Cough, unspecified: Secondary | ICD-10-CM | POA: Diagnosis not present

## 2020-08-07 DIAGNOSIS — Z6832 Body mass index (BMI) 32.0-32.9, adult: Secondary | ICD-10-CM | POA: Diagnosis not present

## 2020-08-07 DIAGNOSIS — L89152 Pressure ulcer of sacral region, stage 2: Secondary | ICD-10-CM | POA: Diagnosis not present

## 2020-08-07 DIAGNOSIS — Z299 Encounter for prophylactic measures, unspecified: Secondary | ICD-10-CM | POA: Diagnosis not present

## 2020-08-07 DIAGNOSIS — D692 Other nonthrombocytopenic purpura: Secondary | ICD-10-CM | POA: Diagnosis not present

## 2020-08-07 DIAGNOSIS — I10 Essential (primary) hypertension: Secondary | ICD-10-CM | POA: Diagnosis not present

## 2020-08-09 DIAGNOSIS — I5021 Acute systolic (congestive) heart failure: Secondary | ICD-10-CM | POA: Diagnosis not present

## 2020-08-09 DIAGNOSIS — I2699 Other pulmonary embolism without acute cor pulmonale: Secondary | ICD-10-CM | POA: Diagnosis not present

## 2020-08-09 DIAGNOSIS — Z6832 Body mass index (BMI) 32.0-32.9, adult: Secondary | ICD-10-CM | POA: Diagnosis not present

## 2020-08-09 DIAGNOSIS — J9611 Chronic respiratory failure with hypoxia: Secondary | ICD-10-CM | POA: Diagnosis not present

## 2020-08-09 DIAGNOSIS — I10 Essential (primary) hypertension: Secondary | ICD-10-CM | POA: Diagnosis not present

## 2020-08-09 DIAGNOSIS — Z299 Encounter for prophylactic measures, unspecified: Secondary | ICD-10-CM | POA: Diagnosis not present

## 2020-08-10 DIAGNOSIS — I10 Essential (primary) hypertension: Secondary | ICD-10-CM | POA: Diagnosis not present

## 2020-08-10 DIAGNOSIS — M159 Polyosteoarthritis, unspecified: Secondary | ICD-10-CM | POA: Diagnosis not present

## 2020-08-16 DIAGNOSIS — E876 Hypokalemia: Secondary | ICD-10-CM | POA: Diagnosis not present

## 2020-08-16 DIAGNOSIS — I4891 Unspecified atrial fibrillation: Secondary | ICD-10-CM | POA: Diagnosis not present

## 2020-08-16 DIAGNOSIS — K449 Diaphragmatic hernia without obstruction or gangrene: Secondary | ICD-10-CM | POA: Diagnosis not present

## 2020-08-16 DIAGNOSIS — R52 Pain, unspecified: Secondary | ICD-10-CM | POA: Diagnosis not present

## 2020-08-16 DIAGNOSIS — I10 Essential (primary) hypertension: Secondary | ICD-10-CM | POA: Diagnosis not present

## 2020-08-16 DIAGNOSIS — S2232XA Fracture of one rib, left side, initial encounter for closed fracture: Secondary | ICD-10-CM | POA: Diagnosis not present

## 2020-08-16 DIAGNOSIS — N200 Calculus of kidney: Secondary | ICD-10-CM | POA: Diagnosis not present

## 2020-08-16 DIAGNOSIS — R404 Transient alteration of awareness: Secondary | ICD-10-CM | POA: Diagnosis not present

## 2020-08-16 DIAGNOSIS — S2243XA Multiple fractures of ribs, bilateral, initial encounter for closed fracture: Secondary | ICD-10-CM | POA: Diagnosis not present

## 2020-08-16 DIAGNOSIS — Z79899 Other long term (current) drug therapy: Secondary | ICD-10-CM | POA: Diagnosis not present

## 2020-08-16 DIAGNOSIS — K5730 Diverticulosis of large intestine without perforation or abscess without bleeding: Secondary | ICD-10-CM | POA: Diagnosis not present

## 2020-08-16 DIAGNOSIS — R339 Retention of urine, unspecified: Secondary | ICD-10-CM | POA: Diagnosis not present

## 2020-08-16 DIAGNOSIS — B9789 Other viral agents as the cause of diseases classified elsewhere: Secondary | ICD-10-CM | POA: Diagnosis not present

## 2020-08-16 DIAGNOSIS — K5792 Diverticulitis of intestine, part unspecified, without perforation or abscess without bleeding: Secondary | ICD-10-CM | POA: Diagnosis not present

## 2020-08-16 DIAGNOSIS — R0602 Shortness of breath: Secondary | ICD-10-CM | POA: Diagnosis not present

## 2020-08-16 DIAGNOSIS — R9431 Abnormal electrocardiogram [ECG] [EKG]: Secondary | ICD-10-CM | POA: Diagnosis not present

## 2020-08-16 DIAGNOSIS — J122 Parainfluenza virus pneumonia: Secondary | ICD-10-CM | POA: Diagnosis not present

## 2020-08-16 DIAGNOSIS — B348 Other viral infections of unspecified site: Secondary | ICD-10-CM | POA: Diagnosis not present

## 2020-08-16 DIAGNOSIS — R000 Tachycardia, unspecified: Secondary | ICD-10-CM | POA: Diagnosis not present

## 2020-08-16 DIAGNOSIS — I959 Hypotension, unspecified: Secondary | ICD-10-CM | POA: Diagnosis not present

## 2020-08-16 DIAGNOSIS — Z880 Allergy status to penicillin: Secondary | ICD-10-CM | POA: Diagnosis not present

## 2020-08-16 DIAGNOSIS — M549 Dorsalgia, unspecified: Secondary | ICD-10-CM | POA: Diagnosis not present

## 2020-08-16 DIAGNOSIS — J181 Lobar pneumonia, unspecified organism: Secondary | ICD-10-CM | POA: Diagnosis not present

## 2020-08-16 DIAGNOSIS — R0902 Hypoxemia: Secondary | ICD-10-CM | POA: Diagnosis not present

## 2020-08-16 DIAGNOSIS — I739 Peripheral vascular disease, unspecified: Secondary | ICD-10-CM | POA: Diagnosis not present

## 2020-08-16 DIAGNOSIS — S2231XA Fracture of one rib, right side, initial encounter for closed fracture: Secondary | ICD-10-CM | POA: Diagnosis not present

## 2020-08-16 DIAGNOSIS — Z66 Do not resuscitate: Secondary | ICD-10-CM | POA: Diagnosis not present

## 2020-08-16 DIAGNOSIS — J9811 Atelectasis: Secondary | ICD-10-CM | POA: Diagnosis not present

## 2020-08-16 DIAGNOSIS — Z86711 Personal history of pulmonary embolism: Secondary | ICD-10-CM | POA: Diagnosis not present

## 2020-08-16 DIAGNOSIS — R918 Other nonspecific abnormal finding of lung field: Secondary | ICD-10-CM | POA: Diagnosis not present

## 2020-08-16 DIAGNOSIS — R739 Hyperglycemia, unspecified: Secondary | ICD-10-CM | POA: Diagnosis not present

## 2020-08-16 DIAGNOSIS — R410 Disorientation, unspecified: Secondary | ICD-10-CM | POA: Diagnosis not present

## 2020-08-16 DIAGNOSIS — J9819 Other pulmonary collapse: Secondary | ICD-10-CM | POA: Diagnosis not present

## 2020-08-16 DIAGNOSIS — Z20822 Contact with and (suspected) exposure to covid-19: Secondary | ICD-10-CM | POA: Diagnosis not present

## 2020-08-16 DIAGNOSIS — K5732 Diverticulitis of large intestine without perforation or abscess without bleeding: Secondary | ICD-10-CM | POA: Diagnosis not present

## 2020-08-16 DIAGNOSIS — N179 Acute kidney failure, unspecified: Secondary | ICD-10-CM | POA: Diagnosis not present

## 2020-08-16 DIAGNOSIS — J811 Chronic pulmonary edema: Secondary | ICD-10-CM | POA: Diagnosis not present

## 2020-08-16 DIAGNOSIS — J90 Pleural effusion, not elsewhere classified: Secondary | ICD-10-CM | POA: Diagnosis not present

## 2020-08-16 DIAGNOSIS — J9691 Respiratory failure, unspecified with hypoxia: Secondary | ICD-10-CM | POA: Diagnosis not present

## 2020-08-16 DIAGNOSIS — D689 Coagulation defect, unspecified: Secondary | ICD-10-CM | POA: Diagnosis not present

## 2020-08-16 DIAGNOSIS — Z452 Encounter for adjustment and management of vascular access device: Secondary | ICD-10-CM | POA: Diagnosis not present

## 2020-08-17 DIAGNOSIS — S2243XA Multiple fractures of ribs, bilateral, initial encounter for closed fracture: Secondary | ICD-10-CM | POA: Diagnosis not present

## 2020-08-17 DIAGNOSIS — R0602 Shortness of breath: Secondary | ICD-10-CM | POA: Diagnosis not present

## 2020-08-17 DIAGNOSIS — R410 Disorientation, unspecified: Secondary | ICD-10-CM | POA: Diagnosis not present

## 2020-08-17 DIAGNOSIS — S2231XA Fracture of one rib, right side, initial encounter for closed fracture: Secondary | ICD-10-CM | POA: Diagnosis not present

## 2020-08-17 DIAGNOSIS — R000 Tachycardia, unspecified: Secondary | ICD-10-CM | POA: Diagnosis not present

## 2020-08-17 DIAGNOSIS — J90 Pleural effusion, not elsewhere classified: Secondary | ICD-10-CM | POA: Diagnosis not present

## 2020-08-17 DIAGNOSIS — S2232XA Fracture of one rib, left side, initial encounter for closed fracture: Secondary | ICD-10-CM | POA: Diagnosis not present

## 2020-08-19 DIAGNOSIS — R918 Other nonspecific abnormal finding of lung field: Secondary | ICD-10-CM | POA: Diagnosis not present

## 2020-08-19 DIAGNOSIS — J9811 Atelectasis: Secondary | ICD-10-CM | POA: Diagnosis not present

## 2020-08-19 DIAGNOSIS — Z452 Encounter for adjustment and management of vascular access device: Secondary | ICD-10-CM | POA: Diagnosis not present

## 2020-08-19 DIAGNOSIS — R410 Disorientation, unspecified: Secondary | ICD-10-CM | POA: Diagnosis not present

## 2020-08-21 DIAGNOSIS — R918 Other nonspecific abnormal finding of lung field: Secondary | ICD-10-CM | POA: Diagnosis not present

## 2020-09-12 DEATH — deceased

## 2021-05-25 IMAGING — US US EXTREM LOW VENOUS
1 series · 13 of 24 positions shown · non-contrast
Comparison: None.

CLINICAL DATA: Lower extremity swelling.



[Series 1: us extrem low venous · 0.10mm/px · 13 of 82 slices shown]
[im 1/82]
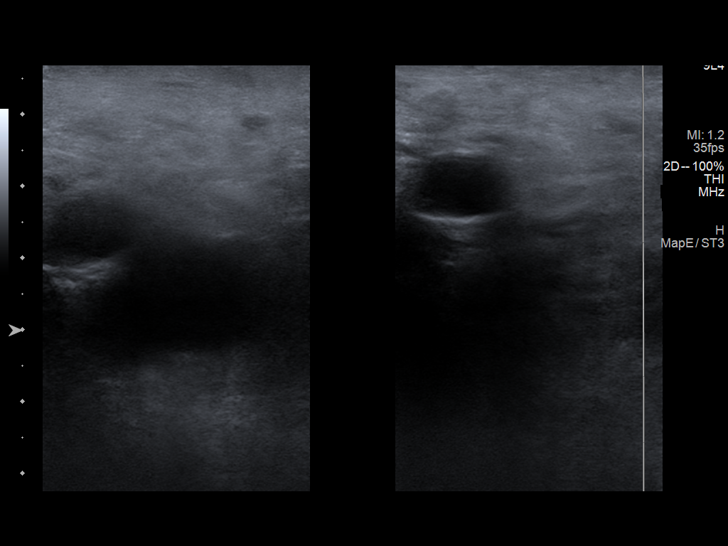
[im 8/82]
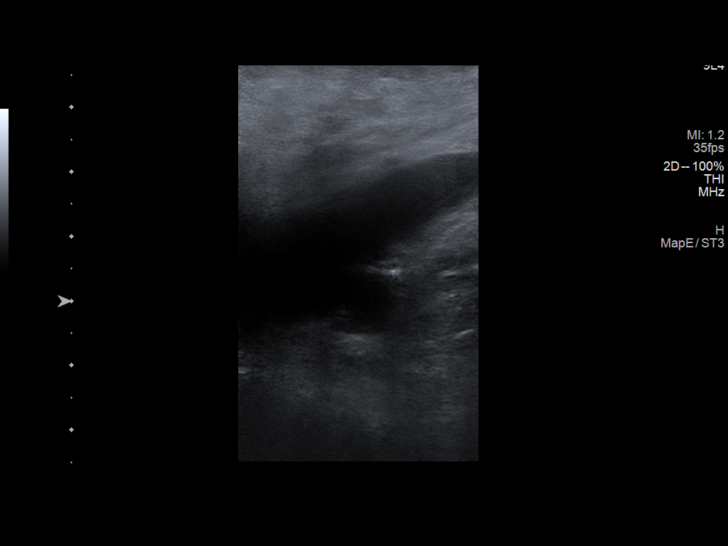
[im 15/82]
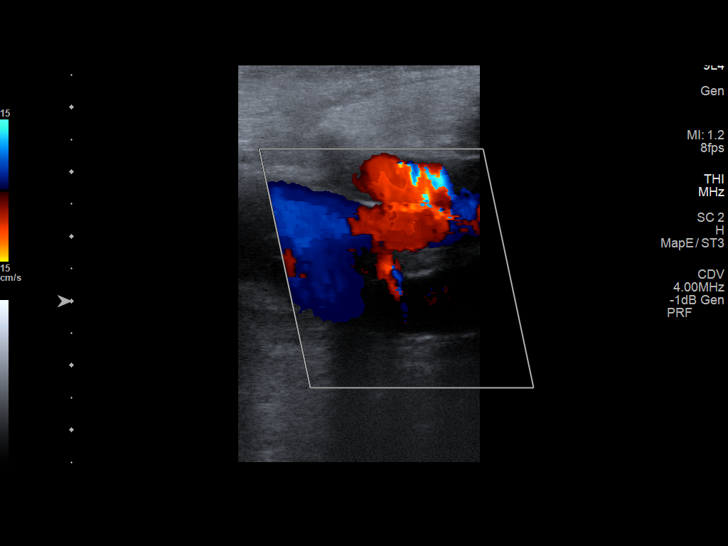
[im 22/82]
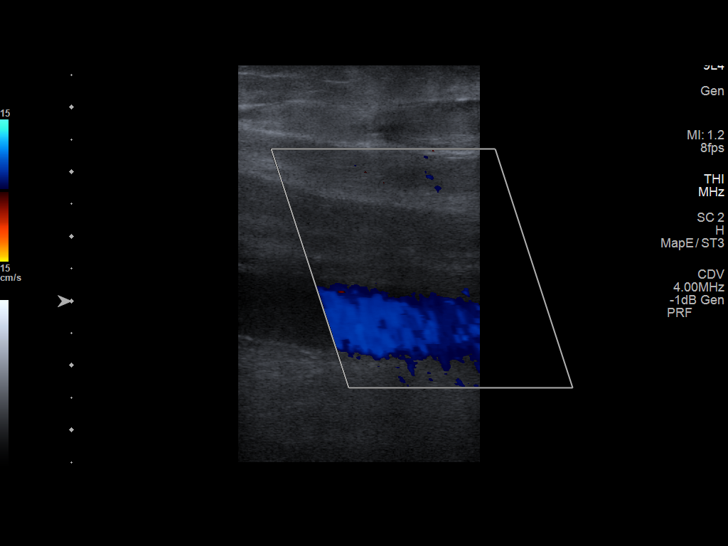
[im 29/82]
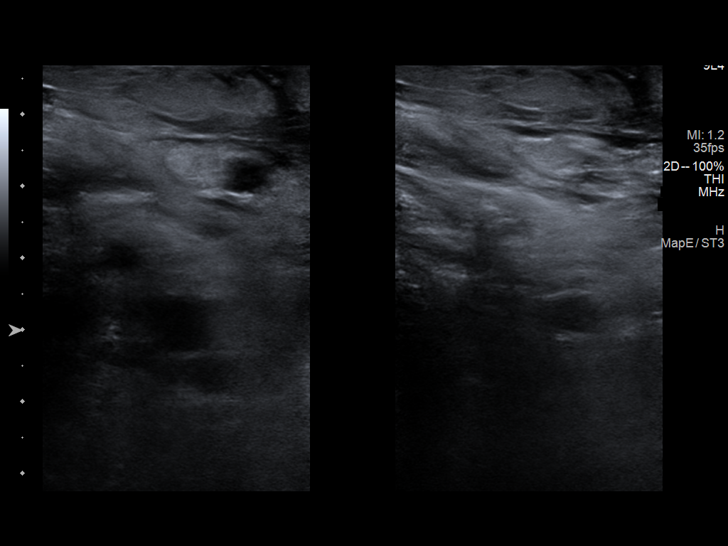
[im 36/82]
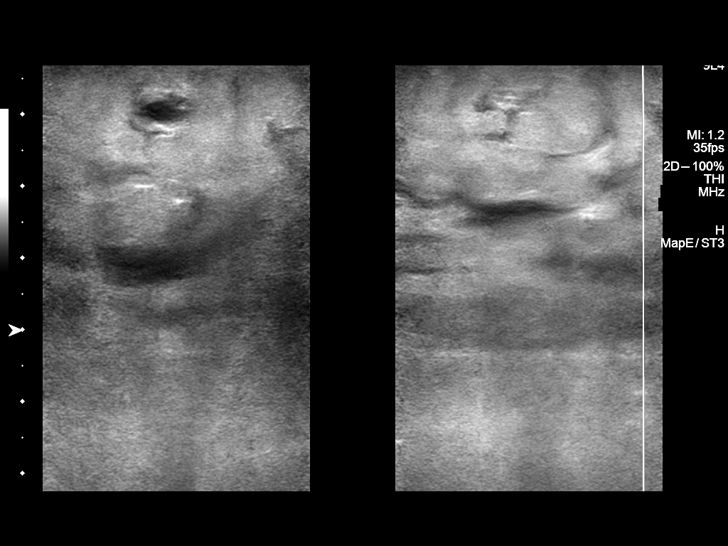
[im 43/82]
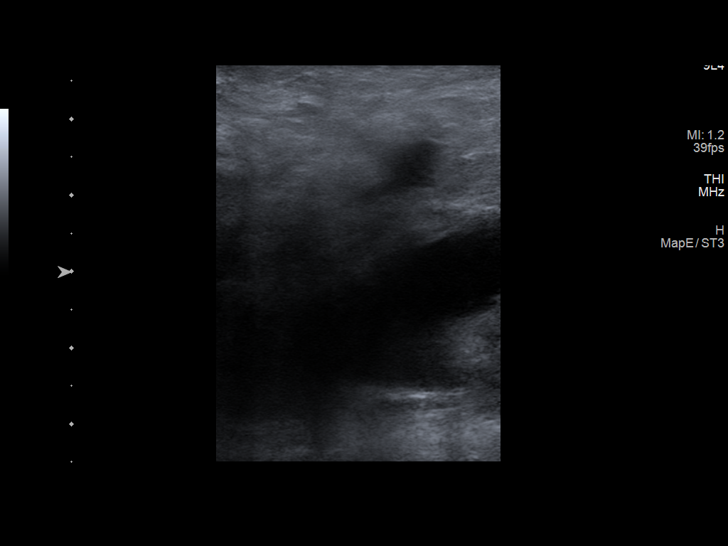
[im 46/82]
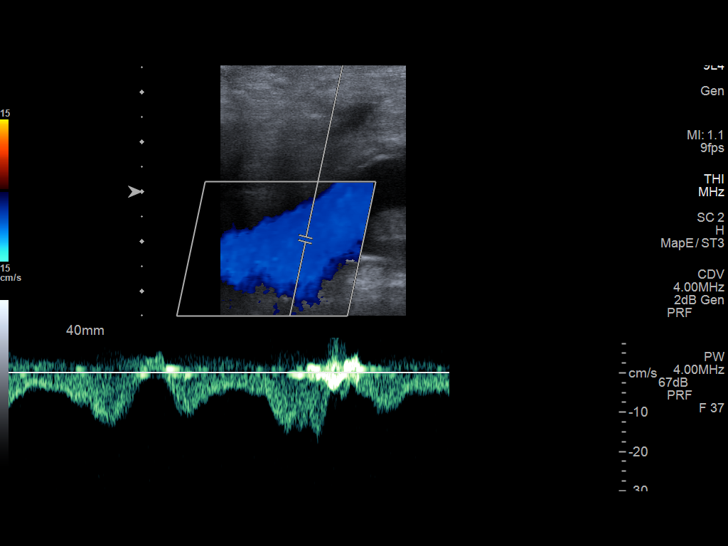
[im 53/82]
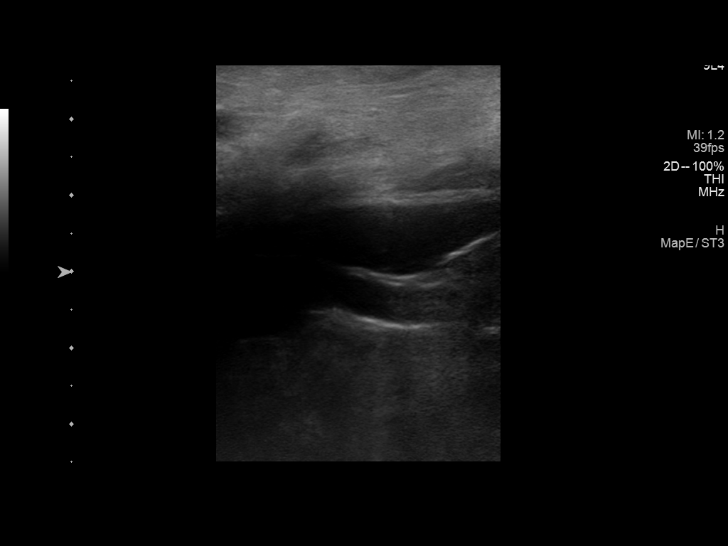
[im 60/82]
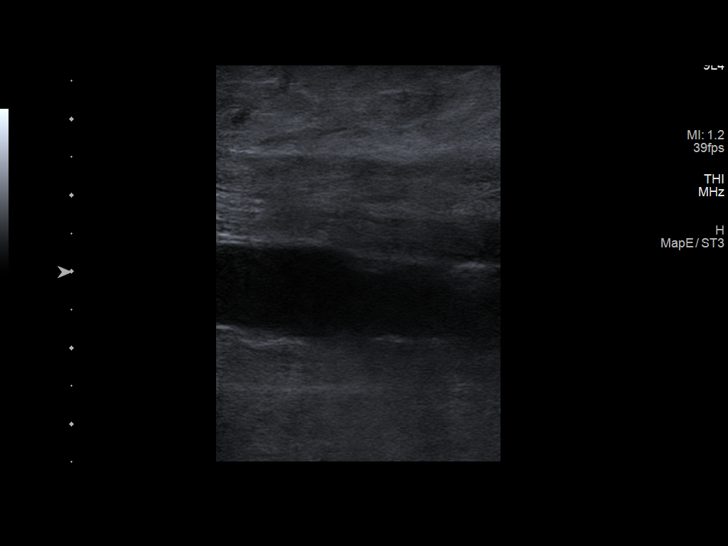
[im 67/82]
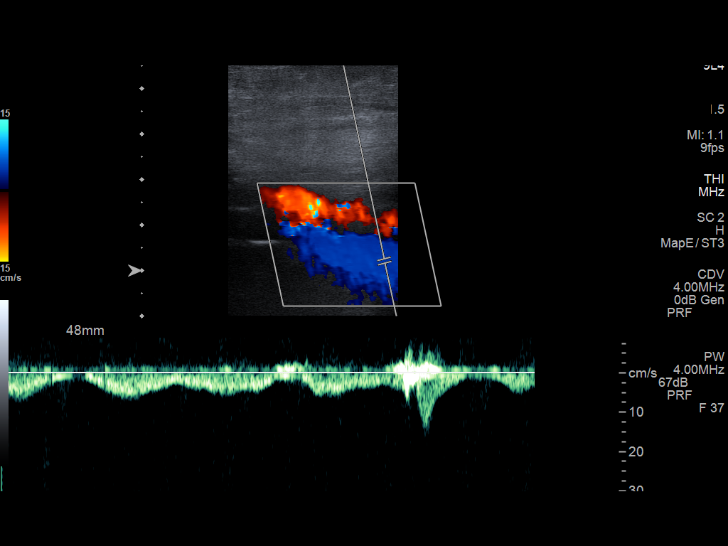
[im 74/82]
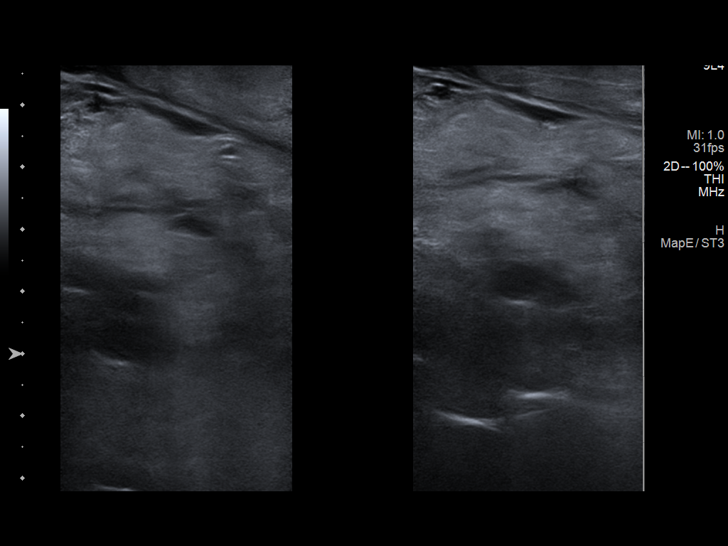
[im 82/82]
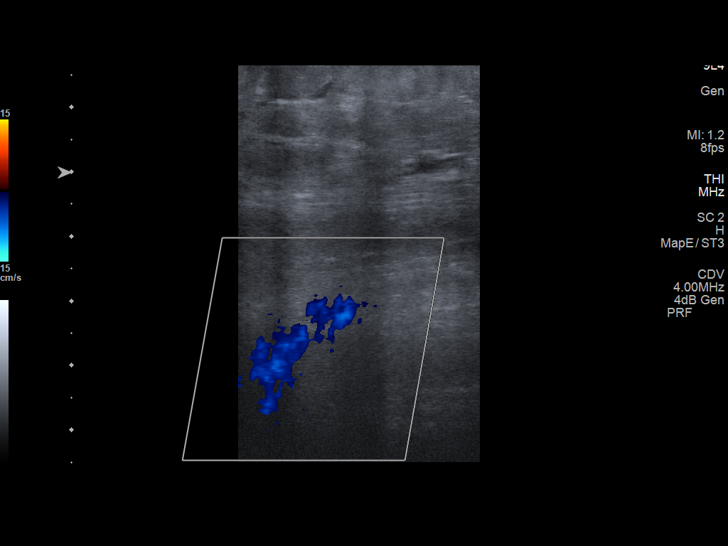

[13 of 24 positions shown; findings below may reference images not displayed]

FINDINGS: RIGHT LOWER EXTREMITY

Common Femoral Vein: No evidence of thrombus. Normal
compressibility, respiratory phasicity and response to augmentation.

Saphenofemoral Junction: No evidence of thrombus. Normal
compressibility and flow on color Doppler imaging.

Profunda Femoral Vein: No evidence of thrombus. Normal
compressibility and flow on color Doppler imaging.

Femoral Vein: No evidence of thrombus. Normal compressibility,
respiratory phasicity and response to augmentation.

Popliteal Vein: No evidence of thrombus. Normal compressibility,
respiratory phasicity and response to augmentation.

Calf Veins: No evidence of thrombus. Normal compressibility and flow
on color Doppler imaging.

Other Findings:  Extensive soft tissue edema.

LEFT LOWER EXTREMITY

Common Femoral Vein: No evidence of thrombus. Normal
compressibility, respiratory phasicity and response to augmentation.

Saphenofemoral Junction: No evidence of thrombus. Normal
compressibility and flow on color Doppler imaging.

Profunda Femoral Vein: No evidence of thrombus. Normal
compressibility and flow on color Doppler imaging.

Femoral Vein: No evidence of thrombus. Normal compressibility,
respiratory phasicity and response to augmentation.

Popliteal Vein: No evidence of thrombus. Normal compressibility,
respiratory phasicity and response to augmentation.

Calf Veins: No evidence of thrombus. Normal compressibility and flow
on color Doppler imaging.

Other Findings:  Extensive soft tissue edema
IMPRESSION: No evidence of deep venous thrombosis in either lower extremity.

Extensive soft tissue edema.

## 2021-05-25 IMAGING — DX DG CHEST 1V
1 series · 1 of 1 positions shown · non-contrast
Comparison: Radiographs and CT 07/22/2019. Radiographs 08/20/2017.

CLINICAL DATA: Failure to thrive. Recently hospitalized for
reported [id number] pneumonia.

EXAM:
CHEST  1 VIEW

[chest ap]
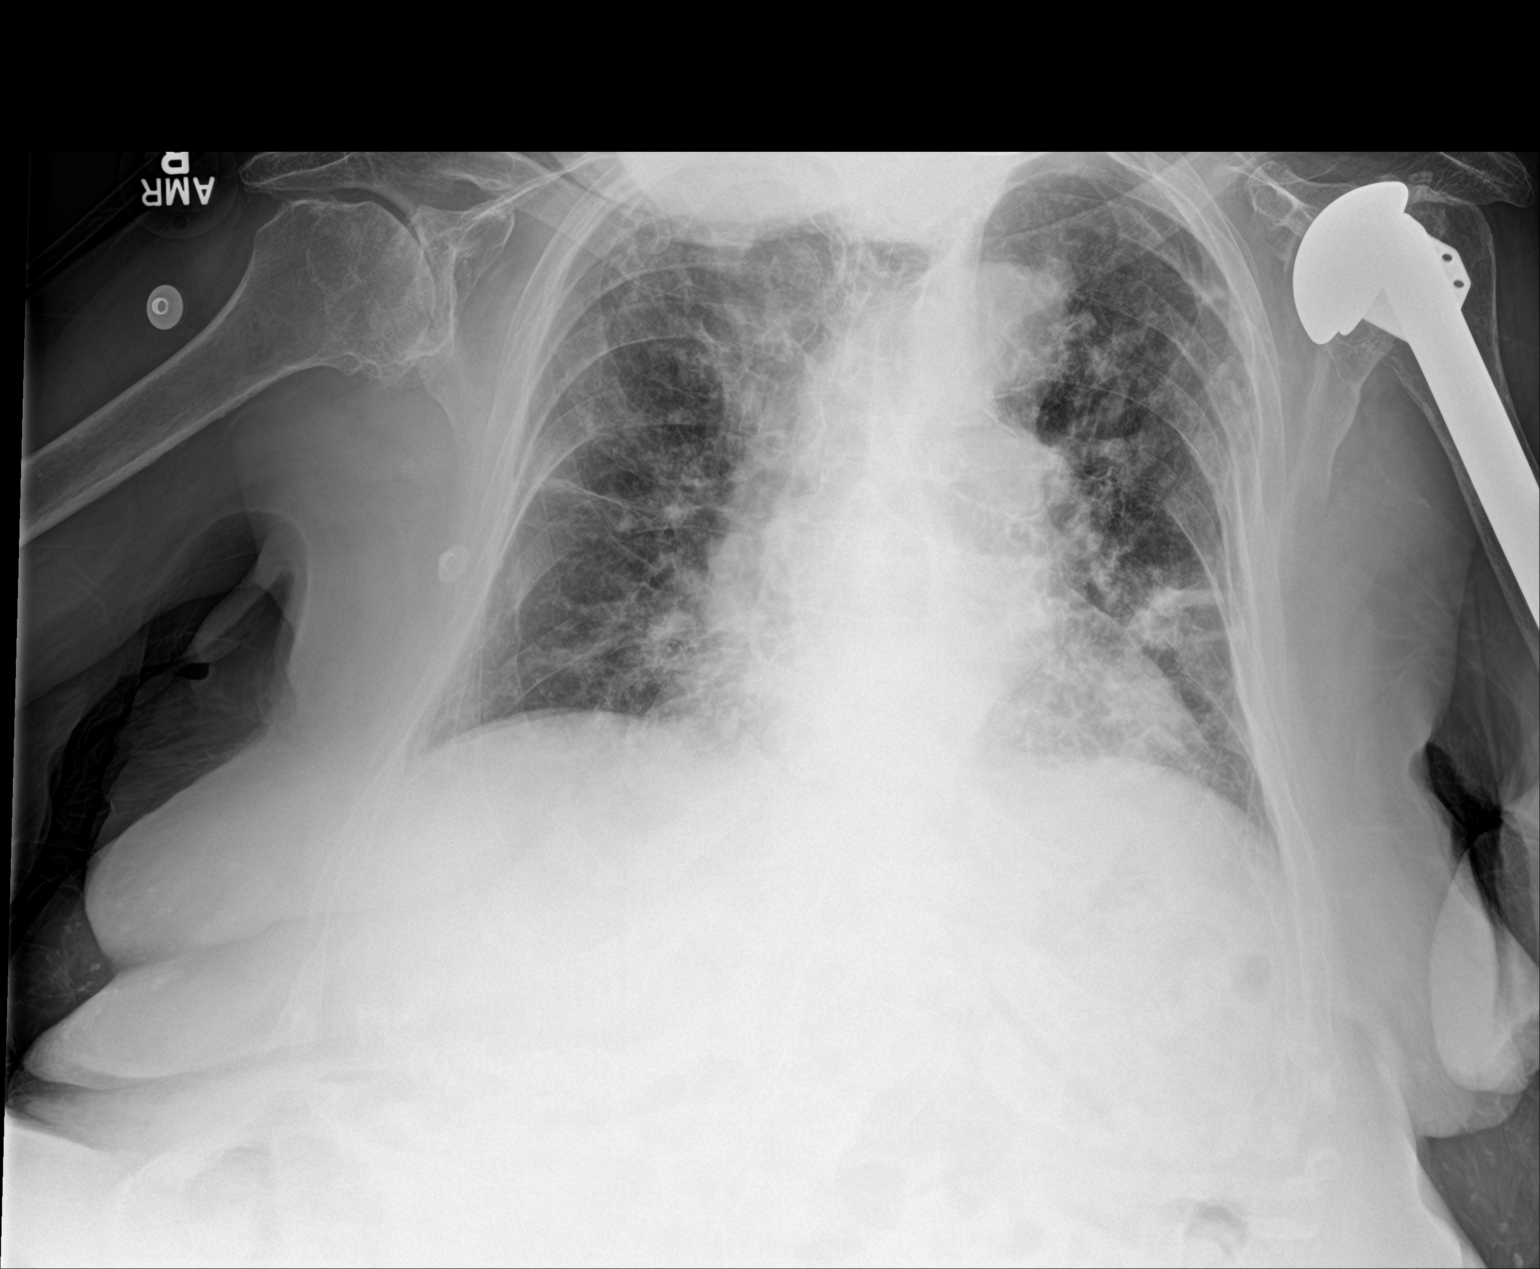

[1 of 1 positions shown; findings below may reference images not displayed]

FINDINGS: 0356 hours the heart size and mediastinal contours are stable with
aortic atherosclerosis. The multifocal airspace and ground-glass
opacity seen in the lungs on the most recent prior study have nearly
resolved. There are residual multifocal densities which appear
largely chronic, similar to the 08/20/2017 study. There is no
pleural effusion or pneumothorax. Patient is status post left
shoulder arthroplasty. Moderate glenohumeral degenerative changes
are present on the right. There are old left-sided rib fractures.
IMPRESSION: 1. Near complete resolution of multifocal airspace and ground-glass
opacities since the most recent prior study consistent with
resolving viral pneumonia.
2. Remaining pulmonary parenchymal densities appear chronic. No new
findings.
